# Patient Record
Sex: Male | Born: 1950 | Race: White | Hispanic: No | Marital: Married | Smoking: Former smoker
Health system: Southern US, Community
[De-identification: ages and names within clinical notes are randomized; demographics above are authoritative.]

## PROBLEM LIST (undated history)

## (undated) DIAGNOSIS — I219 Acute myocardial infarction, unspecified: Secondary | ICD-10-CM

## (undated) DIAGNOSIS — I639 Cerebral infarction, unspecified: Secondary | ICD-10-CM

## (undated) DIAGNOSIS — C61 Malignant neoplasm of prostate: Secondary | ICD-10-CM

## (undated) DIAGNOSIS — E119 Type 2 diabetes mellitus without complications: Secondary | ICD-10-CM

## (undated) DIAGNOSIS — M1990 Unspecified osteoarthritis, unspecified site: Secondary | ICD-10-CM

## (undated) DIAGNOSIS — I10 Essential (primary) hypertension: Secondary | ICD-10-CM

## (undated) DIAGNOSIS — I2510 Atherosclerotic heart disease of native coronary artery without angina pectoris: Secondary | ICD-10-CM

## (undated) DIAGNOSIS — N400 Enlarged prostate without lower urinary tract symptoms: Secondary | ICD-10-CM

## (undated) DIAGNOSIS — M109 Gout, unspecified: Secondary | ICD-10-CM

## (undated) HISTORY — DX: Acute myocardial infarction, unspecified: I21.9

## (undated) HISTORY — DX: Cerebral infarction, unspecified: I63.9

## (undated) HISTORY — DX: Malignant neoplasm of prostate: C61

## (undated) HISTORY — DX: Atherosclerotic heart disease of native coronary artery without angina pectoris: I25.10

## (undated) HISTORY — DX: Type 2 diabetes mellitus without complications: E11.9

## (undated) HISTORY — DX: Gout, unspecified: M10.9

## (undated) HISTORY — DX: Unspecified osteoarthritis, unspecified site: M19.90

## (undated) HISTORY — PX: CHOLECYSTECTOMY: SHX55

## (undated) HISTORY — DX: Benign prostatic hyperplasia without lower urinary tract symptoms: N40.0

## (undated) HISTORY — DX: Essential (primary) hypertension: I10

---

## 1994-12-11 DIAGNOSIS — I219 Acute myocardial infarction, unspecified: Secondary | ICD-10-CM

## 1994-12-11 HISTORY — PX: RIGHT HEART CATHETERIZATION WITH ADENOSINE STUDY: SHX6076

## 1994-12-11 HISTORY — DX: Acute myocardial infarction, unspecified: I21.9

## 2006-11-10 ENCOUNTER — Ambulatory Visit: Payer: Self-pay | Admitting: Internal Medicine

## 2007-07-10 ENCOUNTER — Other Ambulatory Visit: Payer: Self-pay

## 2007-07-10 ENCOUNTER — Emergency Department: Payer: Self-pay | Admitting: Emergency Medicine

## 2007-07-13 ENCOUNTER — Ambulatory Visit: Payer: Self-pay | Admitting: Internal Medicine

## 2009-04-24 ENCOUNTER — Emergency Department: Payer: Self-pay | Admitting: Emergency Medicine

## 2009-07-28 ENCOUNTER — Ambulatory Visit: Payer: Self-pay | Admitting: Urology

## 2009-08-10 ENCOUNTER — Ambulatory Visit: Payer: Self-pay

## 2009-08-11 ENCOUNTER — Ambulatory Visit: Payer: Self-pay | Admitting: Urology

## 2009-08-12 ENCOUNTER — Ambulatory Visit: Payer: Self-pay | Admitting: Urology

## 2010-09-21 ENCOUNTER — Ambulatory Visit: Payer: Self-pay | Admitting: Family Medicine

## 2010-09-28 ENCOUNTER — Ambulatory Visit: Payer: Self-pay | Admitting: Urology

## 2010-10-04 ENCOUNTER — Ambulatory Visit: Payer: Self-pay | Admitting: Urology

## 2010-10-05 ENCOUNTER — Ambulatory Visit: Payer: Self-pay | Admitting: Urology

## 2010-10-06 ENCOUNTER — Ambulatory Visit: Payer: Self-pay | Admitting: Urology

## 2012-02-13 DIAGNOSIS — E7800 Pure hypercholesterolemia, unspecified: Secondary | ICD-10-CM | POA: Insufficient documentation

## 2013-04-04 DIAGNOSIS — M549 Dorsalgia, unspecified: Secondary | ICD-10-CM | POA: Insufficient documentation

## 2013-04-04 DIAGNOSIS — M129 Arthropathy, unspecified: Secondary | ICD-10-CM | POA: Insufficient documentation

## 2014-01-05 DIAGNOSIS — D691 Qualitative platelet defects: Secondary | ICD-10-CM | POA: Insufficient documentation

## 2014-03-14 ENCOUNTER — Emergency Department: Payer: Self-pay | Admitting: Emergency Medicine

## 2014-03-14 LAB — PROTIME-INR
INR: 1
Prothrombin Time: 12.8 secs (ref 11.5–14.7)

## 2014-03-14 LAB — CBC
HCT: 40.3 % (ref 40.0–52.0)
HGB: 13.5 g/dL (ref 13.0–18.0)
MCH: 27 pg (ref 26.0–34.0)
MCHC: 33.5 g/dL (ref 32.0–36.0)
MCV: 80 fL (ref 80–100)
Platelet: 108 10*3/uL — ABNORMAL LOW (ref 150–440)
RBC: 5.02 10*6/uL (ref 4.40–5.90)
RDW: 14.6 % — ABNORMAL HIGH (ref 11.5–14.5)
WBC: 12.4 10*3/uL — ABNORMAL HIGH (ref 3.8–10.6)

## 2014-03-14 LAB — COMPREHENSIVE METABOLIC PANEL
ALBUMIN: 3.5 g/dL (ref 3.4–5.0)
AST: 18 U/L (ref 15–37)
Alkaline Phosphatase: 101 U/L
Anion Gap: 8 (ref 7–16)
BUN: 15 mg/dL (ref 7–18)
Bilirubin,Total: 0.4 mg/dL (ref 0.2–1.0)
CALCIUM: 8.6 mg/dL (ref 8.5–10.1)
CREATININE: 1.15 mg/dL (ref 0.60–1.30)
Chloride: 103 mmol/L (ref 98–107)
Co2: 27 mmol/L (ref 21–32)
EGFR (Non-African Amer.): >60
Glucose: 195 mg/dL — ABNORMAL HIGH (ref 65–99)
OSMOLALITY: 282 (ref 275–301)
Potassium: 3.9 mmol/L (ref 3.5–5.1)
SGPT (ALT): 27 U/L (ref 12–78)
Sodium: 138 mmol/L (ref 136–145)
Total Protein: 6.7 g/dL (ref 6.4–8.2)

## 2014-03-14 LAB — CK TOTAL AND CKMB (NOT AT ARMC)
CK, Total: 133 U/L
CK-MB: 1.4 ng/mL (ref 0.5–3.6)

## 2014-03-14 LAB — TROPONIN I

## 2014-04-01 ENCOUNTER — Ambulatory Visit: Payer: Self-pay | Admitting: Otolaryngology

## 2014-05-06 DIAGNOSIS — H9319 Tinnitus, unspecified ear: Secondary | ICD-10-CM | POA: Insufficient documentation

## 2014-05-06 DIAGNOSIS — R51 Headache: Secondary | ICD-10-CM

## 2014-05-06 DIAGNOSIS — R519 Headache, unspecified: Secondary | ICD-10-CM | POA: Insufficient documentation

## 2014-06-23 DIAGNOSIS — I633 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 of unspecified cerebral artery: Secondary | ICD-10-CM | POA: Insufficient documentation

## 2014-09-10 DIAGNOSIS — H9313 Tinnitus, bilateral: Secondary | ICD-10-CM | POA: Insufficient documentation

## 2014-09-10 DIAGNOSIS — R972 Elevated prostate specific antigen [PSA]: Secondary | ICD-10-CM | POA: Insufficient documentation

## 2014-09-10 DIAGNOSIS — G44219 Episodic tension-type headache, not intractable: Secondary | ICD-10-CM | POA: Insufficient documentation

## 2014-09-10 DIAGNOSIS — E669 Obesity, unspecified: Secondary | ICD-10-CM | POA: Insufficient documentation

## 2014-09-30 DIAGNOSIS — I10 Essential (primary) hypertension: Secondary | ICD-10-CM | POA: Insufficient documentation

## 2015-03-01 ENCOUNTER — Ambulatory Visit: Payer: Self-pay | Admitting: Urology

## 2015-04-01 ENCOUNTER — Ambulatory Visit
Admit: 2015-04-01 | Disposition: A | Discharge status: Home / Self Care | Payer: Self-pay | Attending: Urology | Admitting: Urology

## 2015-04-01 DIAGNOSIS — C61 Malignant neoplasm of prostate: Secondary | ICD-10-CM | POA: Insufficient documentation

## 2015-04-08 DIAGNOSIS — Z87442 Personal history of urinary calculi: Secondary | ICD-10-CM | POA: Insufficient documentation

## 2015-04-08 DIAGNOSIS — M109 Gout, unspecified: Secondary | ICD-10-CM | POA: Insufficient documentation

## 2015-04-21 ENCOUNTER — Ambulatory Visit
Admission: RE | Admit: 2015-04-21 | Discharge: 2015-04-21 | Disposition: A | Discharge status: Home / Self Care | Payer: No Typology Code available for payment source | Source: Ambulatory Visit | Attending: Radiation Oncology | Admitting: Radiation Oncology

## 2015-04-21 ENCOUNTER — Encounter: Payer: Self-pay | Admitting: Radiation Oncology

## 2015-04-21 VITALS — BP 147/81 | HR 79 | Temp 97.1°F | Resp 20 | Ht 74.0 in | Wt 276.0 lb

## 2015-04-21 DIAGNOSIS — Z510 Encounter for antineoplastic radiation therapy: Secondary | ICD-10-CM | POA: Insufficient documentation

## 2015-04-21 DIAGNOSIS — C61 Malignant neoplasm of prostate: Secondary | ICD-10-CM | POA: Insufficient documentation

## 2015-04-21 NOTE — Consult Note (Signed)
Radiation Oncology NEW PATIENT EVALUATION  Name: Warren Decoteau Stitt Sr.  MRN: [id number]  Date:   04/21/2015     DOB: 1951-05-31   This 64 y.o. male patient presents to the clinic for initial evaluation of prostate cancer stage IIa.  REFERRING PHYSICIAN: No ref. provider found  CHIEF COMPLAINT:  Chief Complaint  Patient presents with  . Prostate Cancer    Initial consultation for radiation therapy for prostate cancer    DIAGNOSIS: The encounter diagnosis was Prostate cancer. staged IIa (T1 cN0 M0) Gleason score 7 (3+4) presenting with a PSA of 6.   PREVIOUS INVESTIGATIONS:  Bone scan not ordered as appropriate with given level of PSA Pathology report reviewed Clinical notes reviewed  HPI: Patient is a pleasant 64 year old male presented with recurrent gross hematuria was found to have multiple bladder calculi and mild left hydronephrosis and had recently passed a stone back in April 2016. That time it was noted he had a PSA of 6. Patient underwent 12 core biopsy showing 2 cores positive for adenocarcinoma the highest Gleason score of 7 (3+4). Patient has very low minimal lower urinary tract symptoms no frequency urgency or nocturia. Treatment options have been discussed with the patient he is seen today for radiation oncology consultation. Patient Nicki Reaper a large over 100 cc prostate making seed implantation prohibited. Patient also has multiple comorbidities including previous CVA, adult-onset diabetes, hypertension. Patient has strongly declined any surgical intervention.  PLANNED TREATMENT REGIMEN: Image guided IMRT radiation therapy  PAST MEDICAL HISTORY:  has a past medical history of Prostate cancer; Hypertension; Arthritis; Gout; Stroke; Diabetes mellitus without complication; and BPH (benign prostatic hyperplasia).    PAST SURGICAL HISTORY: History reviewed. No pertinent past surgical history.  FAMILY HISTORY: family history is not on file.  SOCIAL HISTORY:  reports that he  has quit smoking. He has never used smokeless tobacco.  ALLERGIES: Statins  MEDICATIONS:  Current Outpatient Prescriptions  Medication Sig Dispense Refill  . amLODipine (NORVASC) 5 MG tablet   0  . Ascorbic Acid (VITAMIN C) 1000 MG tablet Take 1,000 mg by mouth daily.    [name] aspirin 325 MG tablet Take by mouth.    [name] atorvastatin (LIPITOR) 40 MG tablet   0  . Cholecalciferol (VITAMIN D3) 1000 UNITS CAPS Take 1 capsule by mouth daily.    . cloNIDine (CATAPRES) 0.1 MG tablet   0  . colchicine 0.6 MG tablet   0  . Erythromycin 2 % PADS Apply topically.    . finasteride (PROSCAR) 5 MG tablet Take 5 mg by mouth daily.  1  . glipiZIDE (GLUCOTROL XL) 5 MG 24 hr tablet   0  . hydrochlorothiazide (HYDRODIURIL) 25 MG tablet   0  . meclizine (ANTIVERT) 12.5 MG tablet Take 12.5 mg by mouth 2 (two) times daily.  0  . metFORMIN (GLUCOPHAGE-XR) 500 MG 24 hr tablet   1  . metoprolol (TOPROL-XL) 200 MG 24 hr tablet   0  . potassium chloride SA (K-DUR,KLOR-CON) 20 MEQ tablet   0  . ramipril (ALTACE) 10 MG capsule   0  . rOPINIRole (REQUIP) 0.25 MG tablet   0  . tamsulosin (FLOMAX) 0.4 MG CAPS capsule Take 0.4 mg by mouth daily.  1  . Turmeric 450 MG CAPS Take 1 capsule by mouth daily.    . cyclobenzaprine (FLEXERIL) 10 MG tablet Take by mouth.    [name] omeprazole (PRILOSEC) 40 MG capsule Take by mouth.    . vitamin B-12 (CYANOCOBALAMIN) 100 MCG  tablet Take 100 mcg by mouth daily.     No current facility-administered medications for this encounter.    ECOG PERFORMANCE STATUS:  0 - Asymptomatic  REVIEW OF SYSTEMS:  Patient denies any weight loss, fatigue, weakness, fever, chills or night sweats. Patient denies any loss of vision, blurred vision. Patient denies any ringing  of the ears or hearing loss. No irregular heartbeat. Patient denies heart murmur or history of fainting. Patient denies any chest pain or pain radiating to her upper extremities. Patient denies any shortness of breath, difficulty  breathing at night, cough or hemoptysis. Patient denies any swelling in the lower legs. Patient denies any nausea vomiting, vomiting of blood, or coffee ground material in the vomitus. Patient denies any stomach pain. Patient states has had normal bowel movements no significant constipation or diarrhea. Patient denies any dysuria, hematuria or significant nocturia. Patient denies any problems walking, swelling in the joints or loss of balance. Patient denies any skin changes, loss of hair or loss of weight. Patient denies any excessive worrying or anxiety or significant depression. Patient denies any problems with insomnia. Patient denies excessive thirst, polyuria, polydipsia. Patient denies any swollen glands, patient denies easy bruising or easy bleeding. Patient denies any recent infections, allergies or URI. Patient "s visual fields have not changed significantly in recent time.    PHYSICAL EXAM: BP 147/81 mmHg  Pulse 79  Temp(Src) 97.1 F (36.2 C)  Resp 20  Ht 6\' 2"  (1.88 m)  Wt 276 lb 0.3 oz (125.2 kg)  BMI 35.42 kg/m2 Well-developed slightly obese male in NAD. Lungs are clear to A&P cardiac examination shows regular rate and rhythm. On rectal exam rectal sphincter tone is good prostate is enlarged no discrete nodularity or masses identified sulcus preserved bilaterally. No other rectal abnormality is identified. Well-developed well-nourished patient in NAD. HEENT reveals PERLA, EOMI, discs not visualized.  Oral cavity is clear. No oral mucosal lesions are identified. Neck is clear without evidence of cervical or supraclavicular adenopathy. Lungs are clear to A&P. Cardiac examination is essentially unremarkable with regular rate and rhythm without murmur rub or thrill. Abdomen is benign with no organomegaly or masses noted. Motor sensory and DTR levels are equal and symmetric in the upper and lower extremities. Cranial nerves II through XII are grossly intact. Proprioception is intact. No  peripheral adenopathy or edema is identified. No motor or sensory levels are noted. Crude visual fields are within normal range.   LABORATORY DATA:  No results found for this or any previous visit (from the past 72 hour(s)).   RADIOLOGY RESULTS: No results found.  IMPRESSION: Stage IIa Gleason 7 (3+4) adenocarcinoma the prostate with multiple medical comorbidities in 64 year old male  PLAN: At this time I have again gone over treatment options with the patient. He is adamant about refusing surgery. Prostate is too large for seed implantation. I have recommended image guided IM RT radiation therapy up to 8000 cGy over 8 weeks. I have asked Dr. Erlene Quan to place gold fiduciary markers in the prostate for daily image guided treatment. Risks and benefits of treatment treatment including increased lower urinary tract symptoms, possible diarrhea, fatigue, alteration of blood counts, and possible skin reaction all were discussed in detail with the patient. He seems to comprehend my treatment plan well. I have set him up for CT simulation shortly after markers are placed.  I would like to take this opportunity for allowing me to participate in the care of your patient.Armstead Peaks., MD

## 2015-05-04 ENCOUNTER — Ambulatory Visit
Admission: RE | Admit: 2015-05-04 | Discharge: 2015-05-04 | Disposition: A | Discharge status: Home / Self Care | Payer: No Typology Code available for payment source | Source: Ambulatory Visit | Attending: Radiation Oncology | Admitting: Radiation Oncology

## 2015-05-04 DIAGNOSIS — Z510 Encounter for antineoplastic radiation therapy: Secondary | ICD-10-CM | POA: Diagnosis not present

## 2015-05-04 DIAGNOSIS — C61 Malignant neoplasm of prostate: Secondary | ICD-10-CM | POA: Diagnosis present

## 2015-05-14 ENCOUNTER — Other Ambulatory Visit: Payer: Self-pay | Admitting: *Deleted

## 2015-05-14 DIAGNOSIS — C61 Malignant neoplasm of prostate: Secondary | ICD-10-CM | POA: Diagnosis not present

## 2015-05-17 ENCOUNTER — Ambulatory Visit
Admission: RE | Admit: 2015-05-17 | Discharge: 2015-05-17 | Disposition: A | Discharge status: Home / Self Care | Payer: No Typology Code available for payment source | Source: Ambulatory Visit | Attending: Radiation Oncology | Admitting: Radiation Oncology

## 2015-05-17 DIAGNOSIS — C61 Malignant neoplasm of prostate: Secondary | ICD-10-CM | POA: Diagnosis not present

## 2015-05-18 ENCOUNTER — Ambulatory Visit
Admission: RE | Admit: 2015-05-18 | Discharge: 2015-05-18 | Disposition: A | Discharge status: Home / Self Care | Payer: No Typology Code available for payment source | Source: Ambulatory Visit | Attending: Radiation Oncology | Admitting: Radiation Oncology

## 2015-05-18 DIAGNOSIS — C61 Malignant neoplasm of prostate: Secondary | ICD-10-CM | POA: Diagnosis not present

## 2015-05-19 ENCOUNTER — Ambulatory Visit
Admission: RE | Admit: 2015-05-19 | Discharge: 2015-05-19 | Disposition: A | Discharge status: Home / Self Care | Payer: No Typology Code available for payment source | Source: Ambulatory Visit | Attending: Radiation Oncology | Admitting: Radiation Oncology

## 2015-05-19 DIAGNOSIS — C61 Malignant neoplasm of prostate: Secondary | ICD-10-CM | POA: Diagnosis not present

## 2015-05-20 ENCOUNTER — Ambulatory Visit
Admission: RE | Admit: 2015-05-20 | Discharge: 2015-05-20 | Disposition: A | Discharge status: Home / Self Care | Payer: No Typology Code available for payment source | Source: Ambulatory Visit | Attending: Radiation Oncology | Admitting: Radiation Oncology

## 2015-05-20 DIAGNOSIS — C61 Malignant neoplasm of prostate: Secondary | ICD-10-CM | POA: Diagnosis not present

## 2015-05-21 ENCOUNTER — Ambulatory Visit
Admission: RE | Admit: 2015-05-21 | Discharge: 2015-05-21 | Disposition: A | Discharge status: Home / Self Care | Payer: No Typology Code available for payment source | Source: Ambulatory Visit | Attending: Radiation Oncology | Admitting: Radiation Oncology

## 2015-05-21 DIAGNOSIS — C61 Malignant neoplasm of prostate: Secondary | ICD-10-CM | POA: Diagnosis not present

## 2015-05-24 ENCOUNTER — Ambulatory Visit
Admission: RE | Admit: 2015-05-24 | Discharge: 2015-05-24 | Disposition: A | Discharge status: Home / Self Care | Payer: No Typology Code available for payment source | Source: Ambulatory Visit | Attending: Radiation Oncology | Admitting: Radiation Oncology

## 2015-05-24 DIAGNOSIS — C61 Malignant neoplasm of prostate: Secondary | ICD-10-CM | POA: Diagnosis not present

## 2015-05-25 ENCOUNTER — Ambulatory Visit
Admission: RE | Admit: 2015-05-25 | Discharge: 2015-05-25 | Disposition: A | Discharge status: Home / Self Care | Payer: No Typology Code available for payment source | Source: Ambulatory Visit | Attending: Radiation Oncology | Admitting: Radiation Oncology

## 2015-05-25 DIAGNOSIS — C61 Malignant neoplasm of prostate: Secondary | ICD-10-CM | POA: Diagnosis not present

## 2015-05-26 ENCOUNTER — Ambulatory Visit
Admission: RE | Admit: 2015-05-26 | Discharge: 2015-05-26 | Disposition: A | Discharge status: Home / Self Care | Payer: No Typology Code available for payment source | Source: Ambulatory Visit | Attending: Radiation Oncology | Admitting: Radiation Oncology

## 2015-05-26 DIAGNOSIS — C61 Malignant neoplasm of prostate: Secondary | ICD-10-CM | POA: Diagnosis not present

## 2015-05-27 ENCOUNTER — Ambulatory Visit
Admission: RE | Admit: 2015-05-27 | Discharge: 2015-05-27 | Disposition: A | Discharge status: Home / Self Care | Payer: No Typology Code available for payment source | Source: Ambulatory Visit | Attending: Radiation Oncology | Admitting: Radiation Oncology

## 2015-05-27 ENCOUNTER — Inpatient Hospital Stay: Payer: No Typology Code available for payment source | Attending: Radiation Oncology

## 2015-05-27 DIAGNOSIS — Z510 Encounter for antineoplastic radiation therapy: Secondary | ICD-10-CM | POA: Diagnosis not present

## 2015-05-27 DIAGNOSIS — C61 Malignant neoplasm of prostate: Secondary | ICD-10-CM | POA: Insufficient documentation

## 2015-05-27 LAB — CBC
HEMATOCRIT: 42.5 % (ref 40.0–52.0)
HEMOGLOBIN: 14.2 g/dL (ref 13.0–18.0)
MCH: 25.8 pg — AB (ref 26.0–34.0)
MCHC: 33.4 g/dL (ref 32.0–36.0)
MCV: 77.1 fL — AB (ref 80.0–100.0)
Platelets: 126 10*3/uL — ABNORMAL LOW (ref 150–440)
RBC: 5.51 MIL/uL (ref 4.40–5.90)
RDW: 15.9 % — ABNORMAL HIGH (ref 11.5–14.5)
WBC: 9 10*3/uL (ref 3.8–10.6)

## 2015-05-28 ENCOUNTER — Ambulatory Visit
Admission: RE | Admit: 2015-05-28 | Discharge: 2015-05-28 | Disposition: A | Discharge status: Home / Self Care | Payer: No Typology Code available for payment source | Source: Ambulatory Visit | Attending: Radiation Oncology | Admitting: Radiation Oncology

## 2015-05-28 DIAGNOSIS — C61 Malignant neoplasm of prostate: Secondary | ICD-10-CM | POA: Diagnosis not present

## 2015-05-31 ENCOUNTER — Ambulatory Visit: Admission: RE | Admit: 2015-05-31 | Payer: No Typology Code available for payment source | Source: Ambulatory Visit

## 2015-06-01 ENCOUNTER — Ambulatory Visit
Admission: RE | Admit: 2015-06-01 | Discharge: 2015-06-01 | Disposition: A | Discharge status: Home / Self Care | Payer: No Typology Code available for payment source | Source: Ambulatory Visit | Attending: Radiation Oncology | Admitting: Radiation Oncology

## 2015-06-01 DIAGNOSIS — C61 Malignant neoplasm of prostate: Secondary | ICD-10-CM | POA: Diagnosis not present

## 2015-06-02 ENCOUNTER — Ambulatory Visit
Admission: RE | Admit: 2015-06-02 | Discharge: 2015-06-02 | Disposition: A | Discharge status: Home / Self Care | Payer: No Typology Code available for payment source | Source: Ambulatory Visit | Attending: Radiation Oncology | Admitting: Radiation Oncology

## 2015-06-02 DIAGNOSIS — C61 Malignant neoplasm of prostate: Secondary | ICD-10-CM | POA: Diagnosis not present

## 2015-06-03 ENCOUNTER — Ambulatory Visit
Admission: RE | Admit: 2015-06-03 | Discharge: 2015-06-03 | Disposition: A | Discharge status: Home / Self Care | Payer: No Typology Code available for payment source | Source: Ambulatory Visit | Attending: Radiation Oncology | Admitting: Radiation Oncology

## 2015-06-03 DIAGNOSIS — C61 Malignant neoplasm of prostate: Secondary | ICD-10-CM | POA: Diagnosis not present

## 2015-06-04 ENCOUNTER — Ambulatory Visit
Admission: RE | Admit: 2015-06-04 | Discharge: 2015-06-04 | Disposition: A | Discharge status: Home / Self Care | Payer: No Typology Code available for payment source | Source: Ambulatory Visit | Attending: Radiation Oncology | Admitting: Radiation Oncology

## 2015-06-04 DIAGNOSIS — C61 Malignant neoplasm of prostate: Secondary | ICD-10-CM | POA: Diagnosis not present

## 2015-06-07 ENCOUNTER — Ambulatory Visit
Admission: RE | Admit: 2015-06-07 | Discharge: 2015-06-07 | Disposition: A | Discharge status: Home / Self Care | Payer: No Typology Code available for payment source | Source: Ambulatory Visit | Attending: Radiation Oncology | Admitting: Radiation Oncology

## 2015-06-07 DIAGNOSIS — C61 Malignant neoplasm of prostate: Secondary | ICD-10-CM | POA: Diagnosis not present

## 2015-06-08 ENCOUNTER — Ambulatory Visit
Admission: RE | Admit: 2015-06-08 | Discharge: 2015-06-08 | Disposition: A | Discharge status: Home / Self Care | Payer: No Typology Code available for payment source | Source: Ambulatory Visit | Attending: Radiation Oncology | Admitting: Radiation Oncology

## 2015-06-08 ENCOUNTER — Ambulatory Visit: Payer: No Typology Code available for payment source | Admitting: Radiation Oncology

## 2015-06-08 DIAGNOSIS — C61 Malignant neoplasm of prostate: Secondary | ICD-10-CM | POA: Diagnosis not present

## 2015-06-09 ENCOUNTER — Ambulatory Visit
Admission: RE | Admit: 2015-06-09 | Discharge: 2015-06-09 | Disposition: A | Discharge status: Home / Self Care | Payer: No Typology Code available for payment source | Source: Ambulatory Visit | Attending: Radiation Oncology | Admitting: Radiation Oncology

## 2015-06-09 DIAGNOSIS — C61 Malignant neoplasm of prostate: Secondary | ICD-10-CM | POA: Diagnosis not present

## 2015-06-10 ENCOUNTER — Ambulatory Visit
Admission: RE | Admit: 2015-06-10 | Discharge: 2015-06-10 | Disposition: A | Discharge status: Home / Self Care | Payer: No Typology Code available for payment source | Source: Ambulatory Visit | Attending: Radiation Oncology | Admitting: Radiation Oncology

## 2015-06-10 ENCOUNTER — Inpatient Hospital Stay: Payer: No Typology Code available for payment source

## 2015-06-10 DIAGNOSIS — C61 Malignant neoplasm of prostate: Secondary | ICD-10-CM | POA: Diagnosis not present

## 2015-06-10 LAB — CBC
HEMATOCRIT: 42.8 % (ref 40.0–52.0)
Hemoglobin: 14.1 g/dL (ref 13.0–18.0)
MCH: 25.8 pg — ABNORMAL LOW (ref 26.0–34.0)
MCHC: 33 g/dL (ref 32.0–36.0)
MCV: 78.2 fL — ABNORMAL LOW (ref 80.0–100.0)
Platelets: 118 10*3/uL — ABNORMAL LOW (ref 150–440)
RBC: 5.47 MIL/uL (ref 4.40–5.90)
RDW: 16 % — AB (ref 11.5–14.5)
WBC: 8.9 10*3/uL (ref 3.8–10.6)

## 2015-06-11 ENCOUNTER — Ambulatory Visit
Admission: RE | Admit: 2015-06-11 | Discharge: 2015-06-11 | Disposition: A | Discharge status: Home / Self Care | Payer: No Typology Code available for payment source | Source: Ambulatory Visit | Attending: Radiation Oncology | Admitting: Radiation Oncology

## 2015-06-11 DIAGNOSIS — C61 Malignant neoplasm of prostate: Secondary | ICD-10-CM | POA: Diagnosis not present

## 2015-06-15 ENCOUNTER — Ambulatory Visit
Admission: RE | Admit: 2015-06-15 | Discharge: 2015-06-15 | Disposition: A | Discharge status: Home / Self Care | Payer: No Typology Code available for payment source | Source: Ambulatory Visit | Attending: Radiation Oncology | Admitting: Radiation Oncology

## 2015-06-15 DIAGNOSIS — C61 Malignant neoplasm of prostate: Secondary | ICD-10-CM | POA: Diagnosis not present

## 2015-06-16 ENCOUNTER — Ambulatory Visit
Admission: RE | Admit: 2015-06-16 | Discharge: 2015-06-16 | Disposition: A | Discharge status: Home / Self Care | Payer: No Typology Code available for payment source | Source: Ambulatory Visit | Attending: Radiation Oncology | Admitting: Radiation Oncology

## 2015-06-16 DIAGNOSIS — C61 Malignant neoplasm of prostate: Secondary | ICD-10-CM | POA: Diagnosis not present

## 2015-06-17 ENCOUNTER — Ambulatory Visit
Admission: RE | Admit: 2015-06-17 | Discharge: 2015-06-17 | Disposition: A | Discharge status: Home / Self Care | Payer: No Typology Code available for payment source | Source: Ambulatory Visit | Attending: Radiation Oncology | Admitting: Radiation Oncology

## 2015-06-17 DIAGNOSIS — C61 Malignant neoplasm of prostate: Secondary | ICD-10-CM | POA: Diagnosis not present

## 2015-06-18 ENCOUNTER — Ambulatory Visit
Admission: RE | Admit: 2015-06-18 | Discharge: 2015-06-18 | Disposition: A | Discharge status: Home / Self Care | Payer: No Typology Code available for payment source | Source: Ambulatory Visit | Attending: Radiation Oncology | Admitting: Radiation Oncology

## 2015-06-18 DIAGNOSIS — C61 Malignant neoplasm of prostate: Secondary | ICD-10-CM | POA: Diagnosis not present

## 2015-06-21 ENCOUNTER — Ambulatory Visit
Admission: RE | Admit: 2015-06-21 | Discharge: 2015-06-21 | Disposition: A | Discharge status: Home / Self Care | Payer: No Typology Code available for payment source | Source: Ambulatory Visit | Attending: Radiation Oncology | Admitting: Radiation Oncology

## 2015-06-21 DIAGNOSIS — C61 Malignant neoplasm of prostate: Secondary | ICD-10-CM | POA: Diagnosis not present

## 2015-06-22 ENCOUNTER — Ambulatory Visit
Admission: RE | Admit: 2015-06-22 | Discharge: 2015-06-22 | Disposition: A | Discharge status: Home / Self Care | Payer: No Typology Code available for payment source | Source: Ambulatory Visit | Attending: Radiation Oncology | Admitting: Radiation Oncology

## 2015-06-22 DIAGNOSIS — C61 Malignant neoplasm of prostate: Secondary | ICD-10-CM | POA: Diagnosis not present

## 2015-06-23 ENCOUNTER — Ambulatory Visit
Admission: RE | Admit: 2015-06-23 | Discharge: 2015-06-23 | Disposition: A | Discharge status: Home / Self Care | Payer: No Typology Code available for payment source | Source: Ambulatory Visit | Attending: Radiation Oncology | Admitting: Radiation Oncology

## 2015-06-23 DIAGNOSIS — C61 Malignant neoplasm of prostate: Secondary | ICD-10-CM | POA: Diagnosis not present

## 2015-06-24 ENCOUNTER — Inpatient Hospital Stay: Payer: No Typology Code available for payment source | Attending: Radiation Oncology

## 2015-06-24 ENCOUNTER — Ambulatory Visit
Admission: RE | Admit: 2015-06-24 | Discharge: 2015-06-24 | Disposition: A | Discharge status: Home / Self Care | Payer: No Typology Code available for payment source | Source: Ambulatory Visit | Attending: Radiation Oncology | Admitting: Radiation Oncology

## 2015-06-24 DIAGNOSIS — C61 Malignant neoplasm of prostate: Secondary | ICD-10-CM | POA: Insufficient documentation

## 2015-06-24 LAB — CBC
HEMATOCRIT: 41.6 % (ref 40.0–52.0)
Hemoglobin: 14 g/dL (ref 13.0–18.0)
MCH: 26.2 pg (ref 26.0–34.0)
MCHC: 33.6 g/dL (ref 32.0–36.0)
MCV: 78.2 fL — ABNORMAL LOW (ref 80.0–100.0)
Platelets: 129 10*3/uL — ABNORMAL LOW (ref 150–440)
RBC: 5.32 MIL/uL (ref 4.40–5.90)
RDW: 16.2 % — AB (ref 11.5–14.5)
WBC: 8.6 10*3/uL (ref 3.8–10.6)

## 2015-06-25 ENCOUNTER — Ambulatory Visit
Admission: RE | Admit: 2015-06-25 | Discharge: 2015-06-25 | Disposition: A | Discharge status: Home / Self Care | Payer: No Typology Code available for payment source | Source: Ambulatory Visit | Attending: Radiation Oncology | Admitting: Radiation Oncology

## 2015-06-25 DIAGNOSIS — C61 Malignant neoplasm of prostate: Secondary | ICD-10-CM | POA: Diagnosis not present

## 2015-06-28 ENCOUNTER — Ambulatory Visit
Admission: RE | Admit: 2015-06-28 | Discharge: 2015-06-28 | Disposition: A | Discharge status: Home / Self Care | Payer: No Typology Code available for payment source | Source: Ambulatory Visit | Attending: Radiation Oncology | Admitting: Radiation Oncology

## 2015-06-28 DIAGNOSIS — C61 Malignant neoplasm of prostate: Secondary | ICD-10-CM | POA: Diagnosis not present

## 2015-06-29 ENCOUNTER — Ambulatory Visit: Payer: No Typology Code available for payment source | Admitting: Radiation Oncology

## 2015-06-29 ENCOUNTER — Ambulatory Visit
Admission: RE | Admit: 2015-06-29 | Discharge: 2015-06-29 | Disposition: A | Discharge status: Home / Self Care | Payer: No Typology Code available for payment source | Source: Ambulatory Visit | Attending: Radiation Oncology | Admitting: Radiation Oncology

## 2015-06-29 DIAGNOSIS — C61 Malignant neoplasm of prostate: Secondary | ICD-10-CM | POA: Diagnosis not present

## 2015-06-30 ENCOUNTER — Ambulatory Visit: Admission: RE | Admit: 2015-06-30 | Payer: No Typology Code available for payment source | Source: Ambulatory Visit

## 2015-07-01 ENCOUNTER — Ambulatory Visit
Admission: RE | Admit: 2015-07-01 | Discharge: 2015-07-01 | Disposition: A | Discharge status: Home / Self Care | Payer: No Typology Code available for payment source | Source: Ambulatory Visit | Attending: Radiation Oncology | Admitting: Radiation Oncology

## 2015-07-01 DIAGNOSIS — C61 Malignant neoplasm of prostate: Secondary | ICD-10-CM | POA: Diagnosis not present

## 2015-07-02 ENCOUNTER — Ambulatory Visit
Admission: RE | Admit: 2015-07-02 | Discharge: 2015-07-02 | Disposition: A | Discharge status: Home / Self Care | Payer: No Typology Code available for payment source | Source: Ambulatory Visit | Attending: Radiation Oncology | Admitting: Radiation Oncology

## 2015-07-02 DIAGNOSIS — C61 Malignant neoplasm of prostate: Secondary | ICD-10-CM | POA: Diagnosis not present

## 2015-07-05 ENCOUNTER — Ambulatory Visit
Admission: RE | Admit: 2015-07-05 | Discharge: 2015-07-05 | Disposition: A | Discharge status: Home / Self Care | Payer: No Typology Code available for payment source | Source: Ambulatory Visit | Attending: Radiation Oncology | Admitting: Radiation Oncology

## 2015-07-05 DIAGNOSIS — C61 Malignant neoplasm of prostate: Secondary | ICD-10-CM | POA: Diagnosis not present

## 2015-07-06 ENCOUNTER — Ambulatory Visit
Admission: RE | Admit: 2015-07-06 | Discharge: 2015-07-06 | Disposition: A | Discharge status: Home / Self Care | Payer: No Typology Code available for payment source | Source: Ambulatory Visit | Attending: Radiation Oncology | Admitting: Radiation Oncology

## 2015-07-06 DIAGNOSIS — C61 Malignant neoplasm of prostate: Secondary | ICD-10-CM | POA: Diagnosis not present

## 2015-07-07 ENCOUNTER — Ambulatory Visit
Admission: RE | Admit: 2015-07-07 | Discharge: 2015-07-07 | Disposition: A | Discharge status: Home / Self Care | Payer: No Typology Code available for payment source | Source: Ambulatory Visit | Attending: Radiation Oncology | Admitting: Radiation Oncology

## 2015-07-07 DIAGNOSIS — C61 Malignant neoplasm of prostate: Secondary | ICD-10-CM | POA: Diagnosis not present

## 2015-07-08 ENCOUNTER — Ambulatory Visit
Admission: RE | Admit: 2015-07-08 | Discharge: 2015-07-08 | Disposition: A | Discharge status: Home / Self Care | Payer: No Typology Code available for payment source | Source: Ambulatory Visit | Attending: Radiation Oncology | Admitting: Radiation Oncology

## 2015-07-08 DIAGNOSIS — C61 Malignant neoplasm of prostate: Secondary | ICD-10-CM | POA: Diagnosis not present

## 2015-07-09 ENCOUNTER — Ambulatory Visit
Admission: RE | Admit: 2015-07-09 | Discharge: 2015-07-09 | Disposition: A | Discharge status: Home / Self Care | Payer: No Typology Code available for payment source | Source: Ambulatory Visit | Attending: Radiation Oncology | Admitting: Radiation Oncology

## 2015-07-09 DIAGNOSIS — C61 Malignant neoplasm of prostate: Secondary | ICD-10-CM | POA: Diagnosis not present

## 2015-07-12 ENCOUNTER — Ambulatory Visit
Admission: RE | Admit: 2015-07-12 | Discharge: 2015-07-12 | Disposition: A | Discharge status: Home / Self Care | Payer: No Typology Code available for payment source | Source: Ambulatory Visit | Attending: Radiation Oncology | Admitting: Radiation Oncology

## 2015-07-12 DIAGNOSIS — C61 Malignant neoplasm of prostate: Secondary | ICD-10-CM | POA: Diagnosis not present

## 2015-07-13 ENCOUNTER — Ambulatory Visit
Admission: RE | Admit: 2015-07-13 | Discharge: 2015-07-13 | Disposition: A | Discharge status: Home / Self Care | Payer: No Typology Code available for payment source | Source: Ambulatory Visit | Attending: Radiation Oncology | Admitting: Radiation Oncology

## 2015-07-13 ENCOUNTER — Ambulatory Visit: Payer: No Typology Code available for payment source

## 2015-07-13 DIAGNOSIS — C61 Malignant neoplasm of prostate: Secondary | ICD-10-CM | POA: Diagnosis not present

## 2015-07-14 ENCOUNTER — Ambulatory Visit: Payer: No Typology Code available for payment source

## 2015-07-14 ENCOUNTER — Ambulatory Visit
Admission: RE | Admit: 2015-07-14 | Discharge: 2015-07-14 | Disposition: A | Discharge status: Home / Self Care | Payer: No Typology Code available for payment source | Source: Ambulatory Visit | Attending: Radiation Oncology | Admitting: Radiation Oncology

## 2015-07-14 DIAGNOSIS — C61 Malignant neoplasm of prostate: Secondary | ICD-10-CM | POA: Diagnosis not present

## 2015-07-15 ENCOUNTER — Ambulatory Visit
Admission: RE | Admit: 2015-07-15 | Discharge: 2015-07-15 | Disposition: A | Discharge status: Home / Self Care | Payer: No Typology Code available for payment source | Source: Ambulatory Visit | Attending: Radiation Oncology | Admitting: Radiation Oncology

## 2015-07-15 DIAGNOSIS — C61 Malignant neoplasm of prostate: Secondary | ICD-10-CM | POA: Diagnosis not present

## 2015-08-26 ENCOUNTER — Ambulatory Visit
Admission: RE | Admit: 2015-08-26 | Discharge: 2015-08-26 | Disposition: A | Discharge status: Home / Self Care | Payer: No Typology Code available for payment source | Source: Ambulatory Visit | Attending: Radiation Oncology | Admitting: Radiation Oncology

## 2015-08-26 ENCOUNTER — Encounter: Payer: Self-pay | Admitting: Radiation Oncology

## 2015-08-26 ENCOUNTER — Other Ambulatory Visit: Payer: Self-pay | Admitting: *Deleted

## 2015-08-26 VITALS — BP 151/84 | HR 80 | Temp 97.0°F | Resp 20 | Wt 274.4 lb

## 2015-08-26 DIAGNOSIS — C61 Malignant neoplasm of prostate: Secondary | ICD-10-CM

## 2015-08-26 NOTE — Progress Notes (Signed)
Radiation Oncology Follow up Note  Name: Warren Heppler Beckford Sr.   Date:   08/26/2015 MRN:  [id number] DOB: 07-Jul-1951    This 64 y.o. male presents to the clinic today for follow-up for prostate cancer.  REFERRING PROVIDER: [name], MD  HPI: Patient is a 64 year old male now out 1 month having completed IM RT radiation therapy for stage IIa (T1 CN 0 M0) adenocarcinoma the prostate with a Gleason score of 7 (3+4) presenting with a PSA of 6. Patient has multiple comorbidities including CVA adult-onset diabetes and hypertension. He is seen today in routine follow-up and is doing well. He states he's having no lower urinary tract symptoms or diarrhea at this time he states he still quite fatigued. He does very little exercise which I've encouraged him to increase his exercise program..  COMPLICATIONS OF TREATMENT: none  FOLLOW UP COMPLIANCE: keeps appointments   PHYSICAL EXAM:  BP 151/84 mmHg  Pulse 80  Temp(Src) 97 F (36.1 C)  Resp 20  Wt 274 lb 5.8 oz (124.45 kg) On rectal exam rectal sphincter tone is good. Prostate is smooth contracted without evidence of nodularity or mass. Sulcus is preserved bilaterally. No discrete nodularity is identified. No other rectal abnormalities are noted. Well-developed well-nourished patient in NAD. HEENT reveals PERLA, EOMI, discs not visualized.  Oral cavity is clear. No oral mucosal lesions are identified. Neck is clear without evidence of cervical or supraclavicular adenopathy. Lungs are clear to A&P. Cardiac examination is essentially unremarkable with regular rate and rhythm without murmur rub or thrill. Abdomen is benign with no organomegaly or masses noted. Motor sensory and DTR levels are equal and symmetric in the upper and lower extremities. Cranial nerves II through XII are grossly intact. Proprioception is intact. No peripheral adenopathy or edema is identified. No motor or sensory levels are noted. Crude visual fields are within normal  range.   RADIOLOGY RESULTS: No current films for review  PLAN: At the present time he is recovering nicely with very minimal side effects from his I MRT radiation therapy. I've encouraged him to increase his exercise performance Was Fatigue and Obesity. I've Explained to Him Our Protocol for Following His PSA of Asked to See Him Back in 4-5 Months for Follow-Up and We Will Obtain a PSA at That Time If It Is Not Already Been Performed. Patient Is to Call Sooner with Any Concerns.  I would like to take this opportunity for allowing me to participate in the care of your patient.Armstead Peaks., MD

## 2015-10-29 ENCOUNTER — Encounter: Payer: Self-pay | Admitting: Urology

## 2015-10-29 ENCOUNTER — Ambulatory Visit (INDEPENDENT_AMBULATORY_CARE_PROVIDER_SITE_OTHER): Payer: No Typology Code available for payment source | Admitting: Urology

## 2015-10-29 VITALS — BP 145/81 | HR 87 | Ht 74.0 in | Wt 276.8 lb

## 2015-10-29 DIAGNOSIS — C61 Malignant neoplasm of prostate: Secondary | ICD-10-CM | POA: Diagnosis not present

## 2015-10-29 DIAGNOSIS — I2510 Atherosclerotic heart disease of native coronary artery without angina pectoris: Secondary | ICD-10-CM

## 2015-10-29 DIAGNOSIS — E119 Type 2 diabetes mellitus without complications: Secondary | ICD-10-CM | POA: Insufficient documentation

## 2015-10-29 DIAGNOSIS — N200 Calculus of kidney: Secondary | ICD-10-CM | POA: Diagnosis not present

## 2015-10-29 HISTORY — DX: Atherosclerotic heart disease of native coronary artery without angina pectoris: I25.10

## 2015-10-29 NOTE — Progress Notes (Signed)
10/29/2015 2:47 PM   Warren Cera Pierro Sr. 1951-08-09 CN:[id number]  Referring provider: [name], MD [address] [place], [place] [address]  Chief Complaint  Patient presents with  . Follow-up    Prostate cancer    HPI: The patient is a 64 year old gentleman presents for follow-up of prostate cancer and nephrolithiasis.  1. Prostate cancer The patient underwent IMRT which was completed in August 2016. He is due for a PSA and generator 2017. In regards to pathology, he had Gleason 3+4 involving 6% of the tissue at the right lateral base Gleason 3+3 involving 60% of the tissue in the right lateral mid prostate for total of 2 of 12 cores. His transrectal ultrasound volume was 103 cc. His pretreatment PSA was 6.05.  He is doing well after his radiation with occasional diarrhea. He notes no other problems from the radiation therapy at this time.  2. Nephrolithiasis He underwent a hematuria workup which revealed nephrolithiasis. The CT urogram revealed multiple small bladder calculi as well as nonobstructive left kidney lower pole calculi. There is also mild left hydroureteronephrosis without filling defect that was concerning for possibly recently passed stone. Repeat ultrasound did show only mild pelviectasis. His stones were calcium oxalate.  He feels he has passed the stones last saw him. He has had no stone pain.    PMH: Past Medical History  Diagnosis Date  . Prostate cancer ([facility])   . Hypertension   . Arthritis   . Gout   . Stroke ([place])   . Diabetes mellitus without complication ([place])   . BPH (benign prostatic hyperplasia)   . Arteriosclerosis of coronary artery 10/29/2015    Overview:  with mi     Surgical History: No past surgical history on file.  Home Medications:    Medication List       This list is accurate as of: 10/29/15  2:47 PM.  Always use your most recent med list.               amLODipine 5 MG tablet  Commonly known as:  NORVASC     aspirin 325 MG tablet  Take by mouth.     atorvastatin 40 MG tablet  Commonly known as:  LIPITOR     cloNIDine 0.1 MG tablet  Commonly known as:  CATAPRES     colchicine 0.6 MG tablet     cyclobenzaprine 10 MG tablet  Commonly known as:  FLEXERIL  Take by mouth.     finasteride 5 MG tablet  Commonly known as:  PROSCAR  Take 5 mg by mouth daily.     glipiZIDE 5 MG 24 hr tablet  Commonly known as:  GLUCOTROL XL     hydrochlorothiazide 25 MG tablet  Commonly known as:  HYDRODIURIL     meclizine 25 MG tablet  Commonly known as:  ANTIVERT     metFORMIN 1000 MG (MOD) 24 hr tablet  Commonly known as:  GLUMETZA     metoprolol 200 MG 24 hr tablet  Commonly known as:  TOPROL-XL     omeprazole 40 MG capsule  Commonly known as:  PRILOSEC  Take by mouth.     potassium chloride SA 20 MEQ tablet  Commonly known as:  K-DUR,KLOR-CON     ramipril 10 MG capsule  Commonly known as:  ALTACE     rOPINIRole 0.25 MG tablet  Commonly known as:  REQUIP     tamsulosin 0.4 MG Caps capsule  Commonly known as:  FLOMAX  Take 0.4 mg by mouth daily.     Turmeric 450 MG Caps  Take 1 capsule by mouth daily.     vitamin B-12 100 MCG tablet  Commonly known as:  CYANOCOBALAMIN  Take 100 mcg by mouth daily.     vitamin C 1000 MG tablet  Take 1,000 mg by mouth daily.     Vitamin D3 1000 UNITS Caps  Take 1 capsule by mouth daily.        Allergies:  Allergies  Allergen Reactions  . Pravastatin     Other reaction(s): Unknown  . Statins Rash    Family History: No family history on file.  Social History:  reports that he has quit smoking. He has never used smokeless tobacco. His alcohol and drug histories are not on file.  ROS: UROLOGY Frequent Urination?: No Hard to postpone urination?: Yes Burning/pain with urination?: No Get up at night to urinate?: No Leakage of urine?: No Urine stream starts and stops?: No Trouble starting stream?: No Do you have to strain to  urinate?: No Blood in urine?: No Urinary tract infection?: No Sexually transmitted disease?: No Injury to kidneys or bladder?: No Painful intercourse?: No Weak stream?: No Erection problems?: Yes Penile pain?: No  Gastrointestinal Nausea?: No Vomiting?: No Indigestion/heartburn?: No Diarrhea?: No Constipation?: Yes  Constitutional Fever: No Night sweats?: Yes Weight loss?: No Fatigue?: No  Skin Skin rash/lesions?: No Itching?: No  Eyes Blurred vision?: No Double vision?: No  Ears/Nose/Throat Sore throat?: No Sinus problems?: No  Hematologic/Lymphatic Swollen glands?: No Easy bruising?: No  Cardiovascular Leg swelling?: Yes Chest pain?: No  Respiratory Cough?: No Shortness of breath?: No  Endocrine Excessive thirst?: No  Musculoskeletal Back pain?: Yes Joint pain?: No  Neurological Headaches?: No Dizziness?: Yes  Psychologic Depression?: No Anxiety?: No  Physical Exam: BP 145/81 mmHg  Pulse 87  Ht 6\' 2"  (1.88 m)  Wt 276 lb 12.8 oz (125.556 kg)  BMI 35.52 kg/m2  Constitutional:  Alert and oriented, No acute distress. HEENT: [place] AT, moist mucus membranes.  Trachea midline, no masses. Cardiovascular: No clubbing, cyanosis, or edema. Respiratory: Normal respiratory effort, no increased work of breathing. GI: Abdomen is soft, nontender, nondistended, no abdominal masses GU: No CVA tenderness.  Skin: No rashes, bruises or suspicious lesions. Lymph: No cervical or inguinal adenopathy. Neurologic: Grossly intact, no focal deficits, moving all 4 extremities. Psychiatric: Normal mood and affect.  Laboratory Data: Lab Results  Component Value Date   WBC 8.6 06/24/2015   HGB 14.0 06/24/2015   HCT 41.6 06/24/2015   MCV 78.2* 06/24/2015   PLT 129* 06/24/2015    Lab Results  Component Value Date   CREATININE 1.15 03/14/2014    No results found for: PSA  No results found for: TESTOSTERONE  No results found for: HGBA1C  Urinalysis No  results found for: COLORURINE, APPEARANCEUR, LABSPEC, PHURINE, GLUCOSEU, HGBUR, BILIRUBINUR, KETONESUR, PROTEINUR, UROBILINOGEN, NITRITE, LEUKOCYTESUR   Assessment & Plan:    1. Prostate cancer The patient is status post IMRT which was completed in August 2016 for Gleason 3+4 = 7 prostate cancer. He is due for PSA in January 2017.  2. Nephrolithiasis We'll plan to get a KUB and ultrasound to assess his residual stone burden. At last imaging, he had stones within his bladder and left lower pole of his kidney. His stones are calcium oxalate based.   Return in about 4 weeks (around 11/26/2015) for KUB/renal ultrasound prior.  Nickie Retort, [facility] [address]  [address] [place], [place] [address] [phone]

## 2015-11-08 ENCOUNTER — Other Ambulatory Visit: Payer: Self-pay

## 2015-11-08 DIAGNOSIS — N200 Calculus of kidney: Secondary | ICD-10-CM

## 2015-11-16 ENCOUNTER — Ambulatory Visit
Admission: RE | Admit: 2015-11-16 | Discharge: 2015-11-16 | Disposition: A | Discharge status: Home / Self Care | Payer: No Typology Code available for payment source | Source: Ambulatory Visit | Attending: Urology | Admitting: Urology

## 2015-11-16 DIAGNOSIS — N200 Calculus of kidney: Secondary | ICD-10-CM | POA: Diagnosis not present

## 2015-11-25 ENCOUNTER — Encounter: Payer: Self-pay | Admitting: Urology

## 2015-11-25 ENCOUNTER — Ambulatory Visit (INDEPENDENT_AMBULATORY_CARE_PROVIDER_SITE_OTHER): Payer: No Typology Code available for payment source | Admitting: Urology

## 2015-11-25 VITALS — BP 127/79 | HR 76 | Ht 74.0 in | Wt 273.3 lb

## 2015-11-25 DIAGNOSIS — N200 Calculus of kidney: Secondary | ICD-10-CM | POA: Diagnosis not present

## 2015-11-25 LAB — URINALYSIS, COMPLETE
BILIRUBIN UA: NEGATIVE
Glucose, UA: NEGATIVE
Ketones, UA: NEGATIVE
LEUKOCYTES UA: NEGATIVE
Nitrite, UA: NEGATIVE
PH UA: 5.5 (ref 5.0–7.5)
PROTEIN UA: NEGATIVE
RBC UA: NEGATIVE
Specific Gravity, UA: >1.03 — ABNORMAL HIGH (ref 1.005–1.030)
Urobilinogen, Ur: 0.2 mg/dL (ref 0.2–1.0)

## 2015-11-25 LAB — MICROSCOPIC EXAMINATION
Epithelial Cells (non renal): NONE SEEN /hpf (ref 0–10)
WBC, UA: NONE SEEN /hpf (ref 0–?)

## 2015-11-25 NOTE — Progress Notes (Signed)
11/25/2015 10:51 AM   Warren Cera Chatmon Sr. [birth year] CN:[id number]  Referring provider: [name], MD [address] [place], [place] [address]  Chief Complaint  Patient presents with  . New Patient (Initial Visit)    urinary incontinence     HPI: The patient is a 64 year old gentleman presents for follow-up of prostate cancer and nephrolithiasis.  1. Prostate cancer The patient underwent IMRT which was completed in August 2016. He is due for a PSA and January 2017. In regards to pathology, he had Gleason 3+4 involving 6% of the tissue at the right lateral base Gleason 3+3 involving 60% of the tissue in the right lateral mid prostate for total of 2 of 12 cores. His transrectal ultrasound volume was 103 cc. His pretreatment PSA was 6.05. He is doing well after his radiation with occasional diarrhea. He notes no other problems from the radiation therapy at this time.  2. Nephrolithiasis He underwent a hematuria workup which revealed nephrolithiasis. The CT urogram revealed multiple small bladder calculi as well as nonobstructive left kidney lower pole calculi. There is also mild left hydroureteronephrosis without filling defect that was concerning for possibly recently passed stone. Repeat ultrasound did show only mild pelviectasis. His stones were calcium oxalate. He feels he has passed the stones since we last saw him. He has had no stone pain. Renal ultrasound today shows no hydronephrosis and no stones.   PMH: Past Medical History  Diagnosis Date  . Prostate cancer ([name])   . Hypertension   . Arthritis   . Gout   . Stroke ([name])   . Diabetes mellitus without complication ([name])   . BPH (benign prostatic hyperplasia)   . Arteriosclerosis of coronary artery 10/29/2015    Overview:  with mi     Surgical History: No past surgical history on file.  Home Medications:    Medication List       This list is accurate as of: 11/25/15 10:51 AM.  Always use your most  recent med list.               amLODipine 5 MG tablet  Commonly known as:  NORVASC     aspirin 325 MG tablet  Take by mouth.     atorvastatin 20 MG tablet  Commonly known as:  LIPITOR  Take 30 mg by mouth daily.     cloNIDine 0.1 MG tablet  Commonly known as:  CATAPRES     colchicine 0.6 MG tablet  Reported on 11/25/2015     cyclobenzaprine 10 MG tablet  Commonly known as:  FLEXERIL  Take by mouth.     finasteride 5 MG tablet  Commonly known as:  PROSCAR  Take 5 mg by mouth daily.     glipiZIDE 5 MG 24 hr tablet  Commonly known as:  GLUCOTROL XL     hydrochlorothiazide 25 MG tablet  Commonly known as:  HYDRODIURIL     meclizine 25 MG tablet  Commonly known as:  ANTIVERT     metFORMIN 1000 MG (MOD) 24 hr tablet  Commonly known as:  GLUMETZA     metoprolol 200 MG 24 hr tablet  Commonly known as:  TOPROL-XL     omeprazole 40 MG capsule  Commonly known as:  PRILOSEC  Take by mouth. Reported on 11/25/2015     potassium chloride SA 20 MEQ tablet  Commonly known as:  K-DUR,KLOR-CON     ramipril 10 MG capsule  Commonly known as:  ALTACE  rOPINIRole 0.25 MG tablet  Commonly known as:  REQUIP     tamsulosin 0.4 MG Caps capsule  Commonly known as:  FLOMAX  Take 0.4 mg by mouth daily.     Turmeric 450 MG Caps  Take 1 capsule by mouth daily.     vitamin B-12 100 MCG tablet  Commonly known as:  CYANOCOBALAMIN  Take 100 mcg by mouth daily.     vitamin C 1000 MG tablet  Take 1,000 mg by mouth daily.     Vitamin D3 1000 UNITS Caps  Take 1 capsule by mouth daily.        Allergies:  Allergies  Allergen Reactions  . Pravastatin Nausea And Vomiting    Other reaction(s): Unknown  . Statins Rash    Family History: No family history on file.  Social History:  reports that he has quit smoking. He has never used smokeless tobacco. His alcohol and drug histories are not on file.  ROS: UROLOGY Frequent Urination?: No Hard to postpone urination?:  No Burning/pain with urination?: No Get up at night to urinate?: No Leakage of urine?: No Urine stream starts and stops?: No Trouble starting stream?: No Do you have to strain to urinate?: No Blood in urine?: No Urinary tract infection?: No Sexually transmitted disease?: No Injury to kidneys or bladder?: No Painful intercourse?: No Weak stream?: No Erection problems?: No Penile pain?: No  Gastrointestinal Nausea?: No Vomiting?: No Indigestion/heartburn?: No Diarrhea?: No Constipation?: No  Constitutional Fever: No Night sweats?: No Weight loss?: No Fatigue?: No  Skin Skin rash/lesions?: No Itching?: No  Eyes Blurred vision?: No Double vision?: No  Ears/Nose/Throat Sore throat?: No Sinus problems?: No  Hematologic/Lymphatic Swollen glands?: No Easy bruising?: No  Cardiovascular Leg swelling?: No Chest pain?: No  Respiratory Cough?: No Shortness of breath?: No  Endocrine Excessive thirst?: No  Musculoskeletal Back pain?: No Joint pain?: Yes  Neurological Headaches?: No Dizziness?: No  Psychologic Depression?: No Anxiety?: No  Physical Exam: BP 127/79 mmHg  Pulse 76  Ht 6\' 2"  (1.88 m)  Wt 273 lb 4.8 oz (123.968 kg)  BMI 35.07 kg/m2  Constitutional:  Alert and oriented, No acute distress. HEENT: [place] AT, moist mucus membranes.  Trachea midline, no masses. Cardiovascular: No clubbing, cyanosis, or edema. Respiratory: Normal respiratory effort, no increased work of breathing. GI: Abdomen is soft, nontender, nondistended, no abdominal masses GU: No CVA tenderness.  Skin: No rashes, bruises or suspicious lesions. Lymph: No cervical or inguinal adenopathy. Neurologic: Grossly intact, no focal deficits, moving all 4 extremities. Psychiatric: Normal mood and affect.  Laboratory Data: Lab Results  Component Value Date   WBC 8.6 06/24/2015   HGB 14.0 06/24/2015   HCT 41.6 06/24/2015   MCV 78.2* 06/24/2015   PLT 129* 06/24/2015    Lab  Results  Component Value Date   CREATININE 1.15 03/14/2014    No results found for: PSA  No results found for: TESTOSTERONE  No results found for: HGBA1C  Urinalysis No results found for: COLORURINE, APPEARANCEUR, LABSPEC, PHURINE, GLUCOSEU, HGBUR, BILIRUBINUR, KETONESUR, PROTEINUR, UROBILINOGEN, NITRITE, LEUKOCYTESUR  Pertinent Imaging: IMPRESSION: There is mild residual caliectasis on the left without evidence of hydronephrosis. Bilateral ureteral jets are demonstrated. The right kidney and the urinary bladder are unremarkable.  Assessment & Plan:    1. Prostate cancer The patient is status post IMRT which was completed in August 2016 for Gleason 3+4 = 7 prostate cancer. He is due for PSA in January 2017 when he is follow up with  radiation oncology. Follow up with urology in 6 months  2. Nephrolithiasis The patient's renal ultrasound was normal showing no stones. No evidence of disease in this time.  No Follow-up on file.  Nickie Retort, MD  [facility] [address], [place], [place] [address] [phone]

## 2015-12-30 ENCOUNTER — Other Ambulatory Visit: Payer: Self-pay | Admitting: *Deleted

## 2015-12-30 ENCOUNTER — Inpatient Hospital Stay: Payer: BLUE CROSS/BLUE SHIELD | Attending: Radiation Oncology

## 2015-12-30 ENCOUNTER — Ambulatory Visit
Admission: RE | Admit: 2015-12-30 | Discharge: 2015-12-30 | Disposition: A | Discharge status: Home / Self Care | Payer: No Typology Code available for payment source | Source: Ambulatory Visit | Attending: Radiation Oncology | Admitting: Radiation Oncology

## 2015-12-30 ENCOUNTER — Encounter: Payer: Self-pay | Admitting: Radiation Oncology

## 2015-12-30 VITALS — BP 147/81 | HR 88 | Temp 96.9°F | Resp 18 | Wt 277.8 lb

## 2015-12-30 DIAGNOSIS — C61 Malignant neoplasm of prostate: Secondary | ICD-10-CM | POA: Diagnosis not present

## 2015-12-30 LAB — PSA: PSA: 0.17 ng/mL (ref 0.00–4.00)

## 2015-12-30 NOTE — Progress Notes (Signed)
Radiation Oncology Follow up Note  Name: Warren Stanczak Placzek Sr.   Date:   12/30/2015 MRN:  [id number] DOB: March 03, 1951    This 65 y.o. male presents to the clinic today for follow-up for prostate cancer stage IIa presenting with a PSA of 6 now out 5 months having completed IM RT radiation therapy.  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 He is seen today in routine follow-up and is doing well. He had a PSA test performed today which will he reported separately. He specifically denies any lower urinary tract symptoms diarrhea or dysuria.Warren Stone He is being followed by nephrolithiasis.  COMPLICATIONS OF TREATMENT: none  FOLLOW UP COMPLIANCE: keeps appointments   PHYSICAL EXAM:  BP 147/81 mmHg  Pulse 88  Temp(Src) 96.9 F (36.1 C)  Resp 18  Wt 277 lb 12.5 oz (126 kg) On rectal exam rectal sphincter tone is good. Prostate is smooth contracted without evidence of nodularity or mass. Sulcus is preserved bilaterally. No discrete nodularity is identified. No other rectal abnormalities are noted. Well-developed well-nourished patient in NAD. HEENT reveals PERLA, EOMI, discs not visualized.  Oral cavity is clear. No oral mucosal lesions are identified. Neck is clear without evidence of cervical or supraclavicular adenopathy. Lungs are clear to A&P. Cardiac examination is essentially unremarkable with regular rate and rhythm without murmur rub or thrill. Abdomen is benign with no organomegaly or masses noted. Motor sensory and DTR levels are equal and symmetric in the upper and lower extremities. Cranial nerves II through XII are grossly intact. Proprioception is intact. No peripheral adenopathy or edema is identified. No motor or sensory levels are noted. Crude visual fields are within normal  range.  RADIOLOGY RESULTS: Patient recently had renal ultrasound showing no evidence of stones  PLAN: Present time patient is doing well now 5 months out from I MRT radiation therapy for prostate cancer. I've performed PSA level today and will report that separately otherwise I'm please was overall progress. I've asked to see him back in 6 months for follow-up. Patient knows to call sooner with any concerns.  I would like to take this opportunity for allowing me to participate in the care of your patient.Armstead Peaks., MD

## 2016-02-09 ENCOUNTER — Telehealth: Payer: Self-pay

## 2016-02-09 DIAGNOSIS — N400 Enlarged prostate without lower urinary tract symptoms: Secondary | ICD-10-CM

## 2016-02-09 NOTE — Telephone Encounter (Signed)
Pt pharmacy sent a refill of finasteride. Wasn't sure if pt still needed medication with a dx of prostate cancer. Please advise.

## 2016-02-10 NOTE — Telephone Encounter (Signed)
Ok to refill finasteride.

## 2016-02-11 MED ORDER — FINASTERIDE 5 MG PO TABS: 5.0000 mg | ORAL_TABLET | Freq: Every day | ORAL | Status: DC

## 2016-02-11 NOTE — Telephone Encounter (Signed)
Medication sent to pharmacy  

## 2016-02-15 ENCOUNTER — Other Ambulatory Visit: Payer: Self-pay

## 2016-02-15 ENCOUNTER — Telehealth: Payer: Self-pay | Admitting: Urology

## 2016-02-15 DIAGNOSIS — N400 Enlarged prostate without lower urinary tract symptoms: Secondary | ICD-10-CM

## 2016-02-15 MED ORDER — FINASTERIDE 5 MG PO TABS: 5.0000 mg | ORAL_TABLET | Freq: Every day | ORAL | Status: DC

## 2016-02-15 NOTE — Telephone Encounter (Signed)
Patient returned call to the office.  I advised him that the Finasteride was sent to [facility] in [place].    Patient advised that this is the [employer].  He would like the prescription sent to the [facility] in [place].

## 2016-02-15 NOTE — Progress Notes (Signed)
Medication has been sent to [facility] and pharmacy corrected in system.

## 2016-02-23 ENCOUNTER — Other Ambulatory Visit: Payer: Self-pay | Admitting: Obstetrics and Gynecology

## 2016-02-23 MED ORDER — TAMSULOSIN HCL 0.4 MG PO CAPS
0.4000 mg | ORAL_CAPSULE | Freq: Every day | ORAL | Status: DC
Start: 2016-02-23 — End: 2017-03-28

## 2016-05-25 ENCOUNTER — Ambulatory Visit (INDEPENDENT_AMBULATORY_CARE_PROVIDER_SITE_OTHER): Payer: Medicare Other | Admitting: Urology

## 2016-05-25 ENCOUNTER — Encounter: Payer: Self-pay | Admitting: Urology

## 2016-05-25 VITALS — BP 128/71 | HR 70 | Temp 98.1°F | Ht 74.0 in | Wt 275.4 lb

## 2016-05-25 DIAGNOSIS — N200 Calculus of kidney: Secondary | ICD-10-CM | POA: Diagnosis not present

## 2016-05-25 DIAGNOSIS — Z8546 Personal history of malignant neoplasm of prostate: Secondary | ICD-10-CM | POA: Diagnosis not present

## 2016-05-25 NOTE — Progress Notes (Signed)
05/25/2016 11:45 AM   Warren Cera Kleist Sr. 05-20-1951 [id number]  Referring provider: [name], MD [address] [place], [place] [address]  Chief Complaint  Patient presents with  . Prostate Cancer    F/U  . Nephrolithiasis    F/U    HPI: The patient is a 65 year old gentleman presents for follow-up of prostate cancer and nephrolithiasis.  1. Prostate cancer The patient underwent IMRT which was completed in August 2016. In regards to pathology, he had Gleason 3+4 involving 6% of the tissue at the right lateral base Gleason 3+3 involving 60% of the tissue in the right lateral mid prostate for total of 2 of 12 cores. His transrectal ultrasound volume was 103 cc. His pretreatment PSA was 6.05. He is doing well after his radiation with occasional diarrhea. He notes no other problems from the radiation therapy at this time. His PSA was 0.17 in January 2017. He is scheduled for a PSA check with Dr. [name] next month.  2. Nephrolithiasis He underwent a hematuria workup which revealed nephrolithiasis. The CT urogram revealed multiple small bladder calculi as well as nonobstructive left kidney lower pole calculi. There is also mild left hydroureteronephrosis without filling defect that was concerning for possibly recently passed stone. Repeat ultrasound did show only mild pelviectasis. His stones were calcium oxalate. He feels he has passed the stones since we last saw him. He has had no stone pain. Renal ultrasound today shows no hydronephrosis and no stones.    PMH: Past Medical History  Diagnosis Date  . Prostate cancer ([place])   . Hypertension   . Arthritis   . Gout   . Stroke ([place])   . Diabetes mellitus without complication ([place])   . BPH (benign prostatic hyperplasia)   . Arteriosclerosis of coronary artery 10/29/2015    Overview:  with mi     Surgical History: History reviewed. No pertinent past surgical history.  Home Medications:    Medication List      This list is accurate as of: 05/25/16 11:45 AM.  Always use your most recent med list.               amLODipine 5 MG tablet  Commonly known as:  NORVASC     aspirin 325 MG tablet  Take by mouth.     atorvastatin 20 MG tablet  Commonly known as:  LIPITOR  Take 30 mg by mouth daily.     cloNIDine 0.1 MG tablet  Commonly known as:  CATAPRES     colchicine 0.6 MG tablet  Reported on 11/25/2015     cyclobenzaprine 10 MG tablet  Commonly known as:  FLEXERIL  Take 10 mg by mouth 3 (three) times daily as needed.     finasteride 5 MG tablet  Commonly known as:  PROSCAR  Take 1 tablet (5 mg total) by mouth daily.     glipiZIDE 5 MG 24 hr tablet  Commonly known as:  GLUCOTROL XL     hydrochlorothiazide 25 MG tablet  Commonly known as:  HYDRODIURIL     meclizine 25 MG tablet  Commonly known as:  ANTIVERT  12.5 mg.     metFORMIN 500 MG 24 hr tablet  Commonly known as:  GLUCOPHAGE-XR  Take 500 mg by mouth daily.     metoprolol 200 MG 24 hr tablet  Commonly known as:  TOPROL-XL     potassium chloride SA 20 MEQ tablet  Commonly known as:  K-DUR,KLOR-CON     ramipril  10 MG capsule  Commonly known as:  ALTACE     rOPINIRole 0.25 MG tablet  Commonly known as:  REQUIP     tamsulosin 0.4 MG Caps capsule  Commonly known as:  FLOMAX  Take 1 capsule (0.4 mg total) by mouth daily.     Turmeric 450 MG Caps  Take 1 capsule by mouth daily.     vitamin B-12 100 MCG tablet  Commonly known as:  CYANOCOBALAMIN  Take 100 mcg by mouth daily.     vitamin C 1000 MG tablet  Take 1,000 mg by mouth daily.     Vitamin D3 1000 units Caps  Take 1 capsule by mouth daily.        Allergies:  Allergies  Allergen Reactions  . Pravastatin Nausea And Vomiting    Other reaction(s): Unknown  . Statins Rash    Family History: History reviewed. No pertinent family history.  Social History:  reports that he quit smoking about 20 years ago. He has never used smokeless tobacco. He  reports that he does not drink alcohol or use illicit drugs.  ROS: UROLOGY Frequent Urination?: No Hard to postpone urination?: No Burning/pain with urination?: No Get up at night to urinate?: No Leakage of urine?: No Urine stream starts and stops?: No Trouble starting stream?: No Do you have to strain to urinate?: No Blood in urine?: No Urinary tract infection?: No Sexually transmitted disease?: No Injury to kidneys or bladder?: No Painful intercourse?: No Weak stream?: No Erection problems?: Yes Penile pain?: No  Gastrointestinal Nausea?: No Vomiting?: No Indigestion/heartburn?: No Diarrhea?: No Constipation?: No  Constitutional Fever: No Night sweats?: Yes Weight loss?: No Fatigue?: Yes  Skin Skin rash/lesions?: No Itching?: No  Eyes Blurred vision?: No Double vision?: No  Ears/Nose/Throat Sore throat?: No Sinus problems?: No  Hematologic/Lymphatic Swollen glands?: No Easy bruising?: No  Cardiovascular Leg swelling?: Yes Chest pain?: No  Respiratory Cough?: No Shortness of breath?: No  Endocrine Excessive thirst?: No  Musculoskeletal Back pain?: No Joint pain?: Yes  Neurological Headaches?: No Dizziness?: No  Psychologic Depression?: No Anxiety?: No  Physical Exam: BP 128/71 mmHg  Pulse 70  Temp(Src) 98.1 F (36.7 C)  Ht 6\' 2"  (1.88 m)  Wt 275 lb 6.4 oz (124.921 kg)  BMI 35.34 kg/m2  Constitutional:  Alert and oriented, No acute distress. HEENT: [place] AT, moist mucus membranes.  Trachea midline, no masses. Cardiovascular: No clubbing, cyanosis, or edema. Respiratory: Normal respiratory effort, no increased work of breathing. GI: Abdomen is soft, nontender, nondistended, no abdominal masses GU: No CVA tenderness.  Skin: No rashes, bruises or suspicious lesions. Lymph: No cervical or inguinal adenopathy. Neurologic: Grossly intact, no focal deficits, moving all 4 extremities. Psychiatric: Normal mood and affect.  Laboratory  Data: Lab Results  Component Value Date   WBC 8.6 06/24/2015   HGB 14.0 06/24/2015   HCT 41.6 06/24/2015   MCV 78.2* 06/24/2015   PLT 129* 06/24/2015    Lab Results  Component Value Date   CREATININE 1.15 03/14/2014    Lab Results  Component Value Date   PSA 0.17 12/30/2015    No results found for: TESTOSTERONE  No results found for: HGBA1C  Urinalysis    Component Value Date/Time   APPEARANCEUR Clear 11/25/2015 1043   GLUCOSEU Negative 11/25/2015 1043   BILIRUBINUR Negative 11/25/2015 1043   PROTEINUR Negative 11/25/2015 1043   NITRITE Negative 11/25/2015 1043   LEUKOCYTESUR Negative 11/25/2015 1043    Assessment & Plan:    1.  Prostate cancer The patient is status post IMRT which was completed in August 2016 for Gleason 3+4 = 7 prostate cancer. He is due for PSA in July 2017 when he is scheduled to follow up with Dr. [name]. We will schedule for him to follow-up in 7 months with a PSA prior as this will make him 6 months from when Dr. [name] is scheduled to check his PSA.  2. Nephrolithiasis No current evidence of disease  There are no diagnoses linked to this encounter.  No Follow-up on file.  Nickie Retort, MD  [facility] [address] [place], [place] [address] [phone]

## 2016-06-22 ENCOUNTER — Other Ambulatory Visit: Payer: Self-pay | Admitting: *Deleted

## 2016-06-22 DIAGNOSIS — C61 Malignant neoplasm of prostate: Secondary | ICD-10-CM

## 2016-06-29 ENCOUNTER — Inpatient Hospital Stay: Payer: Medicare Other | Attending: Radiation Oncology

## 2016-06-29 ENCOUNTER — Encounter: Payer: Self-pay | Admitting: Radiation Oncology

## 2016-06-29 ENCOUNTER — Ambulatory Visit
Admission: RE | Admit: 2016-06-29 | Discharge: 2016-06-29 | Disposition: A | Discharge status: Home / Self Care | Payer: Medicare Other | Source: Ambulatory Visit | Attending: Radiation Oncology | Admitting: Radiation Oncology

## 2016-06-29 VITALS — BP 127/73 | HR 71 | Resp 18 | Wt 272.5 lb

## 2016-06-29 DIAGNOSIS — K5900 Constipation, unspecified: Secondary | ICD-10-CM | POA: Insufficient documentation

## 2016-06-29 DIAGNOSIS — C61 Malignant neoplasm of prostate: Secondary | ICD-10-CM | POA: Insufficient documentation

## 2016-06-29 DIAGNOSIS — Z923 Personal history of irradiation: Secondary | ICD-10-CM | POA: Diagnosis not present

## 2016-06-29 DIAGNOSIS — R197 Diarrhea, unspecified: Secondary | ICD-10-CM | POA: Diagnosis not present

## 2016-06-29 LAB — PSA: PSA: 0.16 ng/mL (ref 0.00–4.00)

## 2016-06-29 NOTE — Progress Notes (Signed)
Radiation Oncology Follow up Note  Name: Warren Boomer Alderman Sr.   Date:   06/29/2016 MRN:  [id number] DOB: Feb 19, 1951    This 65 y.o. male presents to the clinic today for 1 year follow-up for stage IIa prostate cancer.  REFERRING PROVIDER: [name], MD  HPI: Patient is a 65 year old male now out over a year having completed IM RT radiation therapy for stage IIa (T1 CN 0 M0) adenocarcinoma the prostate presenting with a Gleason score of 7 (3+4) and a PSA of 6. He is seen today in routine follow-up and is doing well. He does have some mild intermittent diarrhea also some constipation.[name] His last PSA 6 months prior was 0.17.  COMPLICATIONS OF TREATMENT: none  FOLLOW UP COMPLIANCE: keeps appointments   PHYSICAL EXAM:  BP 127/73 mmHg  Pulse 71  Resp 18  Wt 272 lb 7.8 oz (123.6 kg) On rectal exam rectal sphincter tone is good. Prostate is smooth contracted without evidence of nodularity or mass. Sulcus is preserved bilaterally. No discrete nodularity is identified. No other rectal abnormalities are noted. Well-developed well-nourished patient in NAD. HEENT reveals PERLA, EOMI, discs not visualized.  Oral cavity is clear. No oral mucosal lesions are identified. Neck is clear without evidence of cervical or supraclavicular adenopathy. Lungs are clear to A&P. Cardiac examination is essentially unremarkable with regular rate and rhythm without murmur rub or thrill. Abdomen is benign with no organomegaly or masses noted. Motor sensory and DTR levels are equal and symmetric in the upper and lower extremities. Cranial nerves II through XII are grossly intact. Proprioception is intact. No peripheral adenopathy or edema is identified. No motor or sensory levels are noted. Crude visual fields are within normal range.  RADIOLOGY RESULTS: No current films for review  PLAN: At the present time patient is doing well I've repeated his PSA level today. Will report that separately. Otherwise I've asked  to see him back in 1 year for follow-up. I've also recommended someActivia yogurt to try to stabilize his bowel pattern. Patient is to call sooner with any concerns. I've set him up for 1 year follow-up.  I would like to take this opportunity to thank you for allowing me to participate in the care of your patient.Armstead Peaks., MD

## 2016-12-22 ENCOUNTER — Other Ambulatory Visit: Payer: Medicare Other

## 2016-12-22 DIAGNOSIS — Z8546 Personal history of malignant neoplasm of prostate: Secondary | ICD-10-CM

## 2016-12-23 LAB — PSA: PROSTATE SPECIFIC AG, SERUM: 0.1 ng/mL (ref 0.0–4.0)

## 2016-12-29 ENCOUNTER — Encounter: Payer: Self-pay | Admitting: Urology

## 2016-12-29 ENCOUNTER — Ambulatory Visit (INDEPENDENT_AMBULATORY_CARE_PROVIDER_SITE_OTHER): Payer: Medicare Other | Admitting: Urology

## 2016-12-29 VITALS — BP 138/76 | HR 82 | Ht 74.0 in | Wt 279.2 lb

## 2016-12-29 DIAGNOSIS — C61 Malignant neoplasm of prostate: Secondary | ICD-10-CM

## 2016-12-29 NOTE — Progress Notes (Signed)
12/29/2016 2:08 PM   Warren Cera Rambo Sr. 01-24-1951 [id number]  Referring provider: [name], MD [address] [place], [place] [address]  Chief Complaint  Patient presents with  . Follow-up    history prostate cancer     HPI: The patient is a 66 year old gentleman presents for follow-up of prostate cancer and nephrolithiasis.  1. Prostate cancer The patient underwent IMRT which was completed in August 2016. In regards to pathology, he had Gleason 3+4 involving 6% of the tissue at the right lateral base Gleason 3+3 involving 60% of the tissue in the right lateral mid prostate for total of 2 of 12 cores. His transrectal ultrasound volume was 103 cc. His pretreatment PSA was 6.05. He is doing well after his radiation with occasional diarrhea. He notes no other problems from the radiation therapy at this time. His PSA was 0.17 in January 2017. It was 0.16 in July 2017. It was 0.1 in January 2018.  His only complaint at this time is diarrhea that he has had since his radiation. He has tried probiotics which have helped with this. He has no other complaints this time. He is voiding well without difficulty.  PMH: Past Medical History:  Diagnosis Date  . Arteriosclerosis of coronary artery 10/29/2015   Overview:  with mi   . Arthritis   . BPH (benign prostatic hyperplasia)   . Diabetes mellitus without complication ([place])   . Gout   . Hypertension   . Prostate cancer ([place])   . Stroke [facility])     Surgical History: No past surgical history on file.  Home Medications:  Allergies as of 12/29/2016      Reactions   Pravastatin Nausea And Vomiting   Other reaction(s): Unknown   Statins Rash      Medication List       Accurate as of 12/29/16  2:08 PM. Always use your most recent med list.          amLODipine 5 MG tablet Commonly known as:  NORVASC Take 5 mg by mouth daily.   aspirin 325 MG tablet Take 325 mg by mouth daily.   atorvastatin 20 MG  tablet Commonly known as:  LIPITOR Take 30 mg by mouth daily.   cloNIDine 0.1 MG tablet Commonly known as:  CATAPRES Take 0.1 mg by mouth 2 (two) times daily.   colchicine 0.6 MG tablet Take 0.6 mg by mouth as needed. Reported on 11/25/2015   cyclobenzaprine 10 MG tablet Commonly known as:  FLEXERIL Take 10 mg by mouth 3 (three) times daily as needed.   finasteride 5 MG tablet Commonly known as:  PROSCAR Take 1 tablet (5 mg total) by mouth daily.   glipiZIDE 5 MG 24 hr tablet Commonly known as:  GLUCOTROL XL Take 5 mg by mouth daily with breakfast.   hydrochlorothiazide 25 MG tablet Commonly known as:  HYDRODIURIL Take 25 mg by mouth daily.   IRON SUPPLEMENT 325 (65 FE) MG tablet Generic drug:  ferrous sulfate Take 325 mg by mouth daily with breakfast.   meclizine 25 MG tablet Commonly known as:  ANTIVERT 12.5 mg.   metFORMIN 500 MG 24 hr tablet Commonly known as:  GLUCOPHAGE-XR Take 1,000 mg by mouth 2 (two) times daily.   metoprolol 200 MG 24 hr tablet Commonly known as:  TOPROL-XL Take 200 mg by mouth daily.   potassium chloride SA 20 MEQ tablet Commonly known as:  K-DUR,KLOR-CON Take 20 mEq by mouth daily.   ramipril 10  MG capsule Commonly known as:  ALTACE Take 10 mg by mouth daily.   rOPINIRole 0.25 MG tablet Commonly known as:  REQUIP Take 0.5 mg by mouth at bedtime.   tamsulosin 0.4 MG Caps capsule Commonly known as:  FLOMAX Take 1 capsule (0.4 mg total) by mouth daily.   vitamin B-12 100 MCG tablet Commonly known as:  CYANOCOBALAMIN Take 100 mcg by mouth daily.   vitamin C 1000 MG tablet Take 1,000 mg by mouth daily.   Vitamin D3 1000 units Caps Take 1 capsule by mouth daily.       Allergies:  Allergies  Allergen Reactions  . Pravastatin Nausea And Vomiting    Other reaction(s): Unknown  . Statins Rash    Family History: No family history on file.  Social History:  reports that he quit smoking about 21 years ago. He has never  used smokeless tobacco. He reports that he does not drink alcohol or use drugs.  ROS: UROLOGY Frequent Urination?: No Hard to postpone urination?: No Burning/pain with urination?: No Get up at night to urinate?: No Leakage of urine?: No Urine stream starts and stops?: No Trouble starting stream?: No Do you have to strain to urinate?: No Blood in urine?: No Urinary tract infection?: No Sexually transmitted disease?: No Injury to kidneys or bladder?: No Painful intercourse?: No Weak stream?: No Erection problems?: Yes Penile pain?: No  Gastrointestinal Nausea?: No Vomiting?: No Indigestion/heartburn?: No Diarrhea?: Yes Constipation?: No  Constitutional Fever: No Night sweats?: No Weight loss?: No Fatigue?: Yes  Skin Skin rash/lesions?: No Itching?: No  Eyes Blurred vision?: No Double vision?: No  Ears/Nose/Throat Sore throat?: No Sinus problems?: No  Hematologic/Lymphatic Swollen glands?: No Easy bruising?: No  Cardiovascular Leg swelling?: No Chest pain?: No  Respiratory Cough?: No Shortness of breath?: No  Endocrine Excessive thirst?: No  Musculoskeletal Back pain?: Yes  Neurological Headaches?: No Dizziness?: No  Psychologic Depression?: No Anxiety?: No  Physical Exam: BP 138/76   Pulse 82   Ht 6\' 2"  (1.88 m)   Wt 279 lb 3.2 oz (126.6 kg)   BMI 35.85 kg/m   Constitutional:  Alert and oriented, No acute distress. HEENT: [place] AT, moist mucus membranes.  Trachea midline, no masses. Cardiovascular: No clubbing, cyanosis, or edema. Respiratory: Normal respiratory effort, no increased work of breathing. GI: Abdomen is soft, nontender, nondistended, no abdominal masses GU: No CVA tenderness.  Skin: No rashes, bruises or suspicious lesions. Lymph: No cervical or inguinal adenopathy. Neurologic: Grossly intact, no focal deficits, moving all 4 extremities. Psychiatric: Normal mood and affect.  Laboratory Data: Lab Results  Component  Value Date   WBC 8.6 06/24/2015   HGB 14.0 06/24/2015   HCT 41.6 06/24/2015   MCV 78.2 (L) 06/24/2015   PLT 129 (L) 06/24/2015    Lab Results  Component Value Date   CREATININE 1.15 03/14/2014    Lab Results  Component Value Date   PSA 0.16 06/29/2016   PSA 0.17 12/30/2015    No results found for: TESTOSTERONE  No results found for: HGBA1C  Urinalysis    Component Value Date/Time   APPEARANCEUR Clear 11/25/2015 1043   GLUCOSEU Negative 11/25/2015 1043   BILIRUBINUR Negative 11/25/2015 1043   PROTEINUR Negative 11/25/2015 1043   NITRITE Negative 11/25/2015 1043   LEUKOCYTESUR Negative 11/25/2015 1043     Assessment & Plan:    1. Prostate cancer I'd like to recheck the patient's PSA in approximately 6 months, however he is already seeing Dr. [name] who  is checking his PSA at that time. The patient is requesting not to follow up at that time since he is already seeing Dr. [name] for a PSA check in 6 monts. I think that is reasonable as long as it is being checked. I'll have him follow-up in one year with a PSA prior in our office. He will follow up sooner if he has any issues.  Return in about 1 year (around 12/29/2017) for PSA prior.  Nickie Retort, MD  [facility] [address] [place], [place] [address] [phone]

## 2017-02-20 ENCOUNTER — Other Ambulatory Visit: Payer: Self-pay | Admitting: *Deleted

## 2017-02-20 ENCOUNTER — Telehealth: Payer: Self-pay | Admitting: *Deleted

## 2017-02-20 DIAGNOSIS — C61 Malignant neoplasm of prostate: Secondary | ICD-10-CM

## 2017-02-20 NOTE — Telephone Encounter (Signed)
Spoke to Mrs. Ricotta to give appointments for MRI and follow up with Dr. [name].  Mrs. Tenna Child readback the appointments and times.

## 2017-02-27 ENCOUNTER — Other Ambulatory Visit: Payer: Self-pay | Admitting: *Deleted

## 2017-02-27 ENCOUNTER — Ambulatory Visit
Admission: RE | Admit: 2017-02-27 | Discharge: 2017-02-27 | Disposition: A | Discharge status: Home / Self Care | Payer: Medicare Other | Source: Ambulatory Visit | Attending: Radiation Oncology | Admitting: Radiation Oncology

## 2017-02-27 DIAGNOSIS — M5126 Other intervertebral disc displacement, lumbar region: Secondary | ICD-10-CM | POA: Insufficient documentation

## 2017-02-27 DIAGNOSIS — C61 Malignant neoplasm of prostate: Secondary | ICD-10-CM

## 2017-02-27 DIAGNOSIS — M48061 Spinal stenosis, lumbar region without neurogenic claudication: Secondary | ICD-10-CM | POA: Diagnosis not present

## 2017-02-27 MED ORDER — GADOBENATE DIMEGLUMINE 529 MG/ML IV SOLN
20.0000 mL | Freq: Once | INTRAVENOUS | Status: AC | PRN
  Administered 2017-02-27: 20 mL via INTRAVENOUS

## 2017-03-01 ENCOUNTER — Encounter: Payer: Self-pay | Admitting: Radiation Oncology

## 2017-03-01 ENCOUNTER — Ambulatory Visit
Admission: RE | Admit: 2017-03-01 | Discharge: 2017-03-01 | Disposition: A | Discharge status: Home / Self Care | Payer: Medicare Other | Source: Ambulatory Visit | Attending: Radiation Oncology | Admitting: Radiation Oncology

## 2017-03-01 VITALS — BP 109/68 | HR 74 | Temp 97.0°F | Resp 18 | Wt 270.7 lb

## 2017-03-01 DIAGNOSIS — C7951 Secondary malignant neoplasm of bone: Secondary | ICD-10-CM | POA: Diagnosis not present

## 2017-03-01 DIAGNOSIS — C61 Malignant neoplasm of prostate: Secondary | ICD-10-CM | POA: Diagnosis not present

## 2017-03-01 DIAGNOSIS — M48061 Spinal stenosis, lumbar region without neurogenic claudication: Secondary | ICD-10-CM | POA: Insufficient documentation

## 2017-03-01 DIAGNOSIS — Z923 Personal history of irradiation: Secondary | ICD-10-CM | POA: Insufficient documentation

## 2017-03-01 NOTE — Progress Notes (Signed)
Radiation Oncology Follow up Note  Name: Warren Rodriguez Mirarchi Sr.   Date:   03/01/2017 MRN:  [id number] DOB: [birth year]    This 66 y.o. male presents to the clinic today for follow-up for evaluation of MRI scan of his lumbar spine in patient with stage IIa prostate cancer.  REFERRING PROVIDER: [name], MD  HPI: Patient is a 66 year old male now out a year and a half having completed IM RT radiation therapy for stage IIa (T1 CN 0 M0) adenocarcinoma the prostate presenting the PSA of 7 patient was having back pain I ordered an MRI scan of his lumbar spine. MRI showed a lesion at L4 containing fat most likely a benign hemangioma. He also had some 6 slight right-sided disc protrusion at L4-5 and mild stenosis at L4-5. No evidence of metastatic disease. His PSAs in the undetectable range.  COMPLICATIONS OF TREATMENT: none  FOLLOW UP COMPLIANCE: keeps appointments   PHYSICAL EXAM:  BP 109/68   Pulse 74   Temp 97 F (36.1 C)   Resp 18   Wt 270 lb 11.6 oz (122.8 kg)   BMI 34.76 kg/m  Well-developed well-nourished patient in NAD. HEENT reveals PERLA, EOMI, discs not visualized.  Oral cavity is clear. No oral mucosal lesions are identified. Neck is clear without evidence of cervical or supraclavicular adenopathy. Lungs are clear to A&P. Cardiac examination is essentially unremarkable with regular rate and rhythm without murmur rub or thrill. Abdomen is benign with no organomegaly or masses noted. Motor sensory and DTR levels are equal and symmetric in the upper and lower extremities. Cranial nerves II through XII are grossly intact. Proprioception is intact. No peripheral adenopathy or edema is identified. No motor or sensory levels are noted. Crude visual fields are within normal range.  RADIOLOGY RESULTS: MRI scan is reviewed and compatible with the above-stated findings  PLAN: Present time patient is doing well from a prostate standpoint with no evidence of biochemical relapse. His MRI  scan does show some disc protrusion and I've offered referral to neurosurgery which she will decide upon. Otherwise he'll continue his work normal follow-ups in August 2018. Patient knows to call sooner with any concerns. He recently was on a prednisone Dosepak which is helped his pain significantly. Patient is to call with any concerns.  I would like to take this opportunity to thank you for allowing me to participate in the care of your patient.Armstead Peaks., MD

## 2017-03-09 ENCOUNTER — Encounter: Payer: Self-pay | Admitting: *Deleted

## 2017-03-26 ENCOUNTER — Telehealth: Payer: Self-pay

## 2017-03-26 DIAGNOSIS — N401 Enlarged prostate with lower urinary tract symptoms: Secondary | ICD-10-CM

## 2017-03-26 NOTE — Telephone Encounter (Signed)
Pt pharmacy sent a refill request of tamsulosin. I see that pt has prostate cancer and is seeing Dr. Donella Stade. I also saw that pt has a hx of kidney stones. Do you want pt to continue tamsulosin?

## 2017-03-28 MED ORDER — TAMSULOSIN HCL 0.4 MG PO CAPS: 0.4000 mg | ORAL_CAPSULE | Freq: Every day | ORAL | 12 refills | Status: DC

## 2017-03-28 NOTE — Telephone Encounter (Signed)
Medication sent to pt pharmacy 

## 2017-07-05 ENCOUNTER — Other Ambulatory Visit: Payer: Self-pay | Admitting: *Deleted

## 2017-07-05 ENCOUNTER — Inpatient Hospital Stay: Payer: Medicare Other | Attending: Radiation Oncology

## 2017-07-05 DIAGNOSIS — C61 Malignant neoplasm of prostate: Secondary | ICD-10-CM

## 2017-07-05 LAB — PSA: PROSTATIC SPECIFIC ANTIGEN: 0.08 ng/mL (ref 0.00–4.00)

## 2017-07-05 NOTE — Progress Notes (Unsigned)
PSA

## 2017-07-12 ENCOUNTER — Ambulatory Visit: Payer: Medicare Other | Admitting: Radiation Oncology

## 2017-07-19 ENCOUNTER — Encounter: Payer: Self-pay | Admitting: Radiation Oncology

## 2017-07-19 ENCOUNTER — Other Ambulatory Visit: Payer: Self-pay | Admitting: *Deleted

## 2017-07-19 ENCOUNTER — Ambulatory Visit
Admission: RE | Admit: 2017-07-19 | Discharge: 2017-07-19 | Disposition: A | Discharge status: Home / Self Care | Payer: Medicare Other | Source: Ambulatory Visit | Attending: Radiation Oncology | Admitting: Radiation Oncology

## 2017-07-19 VITALS — BP 125/68 | HR 70 | Temp 96.3°F | Resp 20 | Wt 273.0 lb

## 2017-07-19 DIAGNOSIS — Z923 Personal history of irradiation: Secondary | ICD-10-CM | POA: Insufficient documentation

## 2017-07-19 DIAGNOSIS — C61 Malignant neoplasm of prostate: Secondary | ICD-10-CM | POA: Diagnosis not present

## 2017-07-19 NOTE — Progress Notes (Signed)
Radiation Oncology Follow up Note  Name: Warren Mauss Depierro Sr.   Date:   07/19/2017 MRN:  [id number] DOB: Nov 04, 1951    This 66 y.o. male presents to the clinic today for 2 year follow-up status post I MRT for adenocarcinoma the prostate.  REFERRING PROVIDER: [name], MD  HPI: patient is a.66 year old male now seen out 2 years having completed IM RT radiation therapy for Gleason 7 (3+4) adenocarcinoma the prostate presenting the PSA of 6. He is seen today in routine follow-up is doing well. His back pain has improved. His most recent PSA last month was 0.08. He has stopped taking Flomax. Specifically denies any significant diarrhea dysuria or any other GI/GU complaints.  COMPLICATIONS OF TREATMENT: none  FOLLOW UP COMPLIANCE: keeps appointments   PHYSICAL EXAM:  BP 125/68   Pulse 70   Temp (!) 96.3 F (35.7 C)   Resp 20   Wt 273 lb 0.6 oz (123.9 kg)   BMI 35.06 kg/m  On rectal exam rectal sphincter tone is good. Prostate is smooth contracted without evidence of nodularity or mass. Sulcus is preserved bilaterally. No discrete nodularity is identified. No other rectal abnormalities are noted. Well-developed well-nourished patient in NAD. HEENT reveals PERLA, EOMI, discs not visualized.  Oral cavity is clear. No oral mucosal lesions are identified. Neck is clear without evidence of cervical or supraclavicular adenopathy. Lungs are clear to A&P. Cardiac examination is essentially unremarkable with regular rate and rhythm without murmur rub or thrill. Abdomen is benign with no organomegaly or masses noted. Motor sensory and DTR levels are equal and symmetric in the upper and lower extremities. Cranial nerves II through XII are grossly intact. Proprioception is intact. No peripheral adenopathy or edema is identified. No motor or sensory levels are noted. Crude visual fields are within normal range.  RADIOLOGY RESULTS: no current films for review  PLAN: present time patient  continues under excellent biochemical control of his prostate cancer. I'm please was overall progress. I've asked to see him back in 1 year for follow-up will obtain a PSA prior to that visit. Patient is to call sooner with any concerns.  I would like to take this opportunity to thank you for allowing me to participate in the care of your patient.Armstead Peaks., MD

## 2017-12-15 ENCOUNTER — Other Ambulatory Visit: Payer: Self-pay | Admitting: Urology

## 2017-12-15 DIAGNOSIS — N400 Enlarged prostate without lower urinary tract symptoms: Secondary | ICD-10-CM

## 2017-12-28 ENCOUNTER — Other Ambulatory Visit: Payer: Medicare Other

## 2017-12-28 DIAGNOSIS — C61 Malignant neoplasm of prostate: Secondary | ICD-10-CM

## 2017-12-29 LAB — PSA: Prostate Specific Ag, Serum: 0.1 ng/mL (ref 0.0–4.0)

## 2017-12-31 ENCOUNTER — Ambulatory Visit (INDEPENDENT_AMBULATORY_CARE_PROVIDER_SITE_OTHER): Payer: Medicare Other | Admitting: Urology

## 2017-12-31 ENCOUNTER — Encounter: Payer: Self-pay | Admitting: Urology

## 2017-12-31 VITALS — BP 129/74 | HR 71 | Ht 74.0 in | Wt 269.0 lb

## 2017-12-31 DIAGNOSIS — C61 Malignant neoplasm of prostate: Secondary | ICD-10-CM | POA: Diagnosis not present

## 2017-12-31 NOTE — Progress Notes (Signed)
12/31/2017 1:48 PM   Warren Stone, Warren Stone [id number]  Referring provider: [name], MD [address] [place], [place] [address]  Chief Complaint  Patient presents with  . Other    HPI: Dr B:   1. Prostate cancer The patient underwent IMRT which was completed in August 2016. In regards to pathology, he had Gleason 3+4 involving 6% of the tissue at the right lateral base Gleason 3+3 involving 60% of the tissue in the right lateral mid prostate for total of 2 of 12 cores. His transrectal ultrasound volume was 103 cc. His pretreatment PSA was 6.05. He is doing well after his radiation with occasional diarrhea. He notes no other problems from the radiation therapy at this time. His PSA was 0.17 in January 2017. It was 0.16 in July 2017. It was 0.1 in January 2018.  Today Frequency is stable PSA December 28, 2017 was 0.1 and PSA in July 2018 was 0.08 Nocturia x1.  Flow is good  Flat smooth prostatic bed with no local recurrence     PMH: Past Medical History:  Diagnosis Date  . Arteriosclerosis of coronary artery 10/29/2015   Overview:  with mi   . Arthritis   . BPH (benign prostatic hyperplasia)   . Diabetes mellitus without complication ([place])   . Gout   . Heart attack ([place]) 1996  . Hypertension   . Prostate cancer ([place])   . Stroke [facility])     Surgical History: Past Surgical History:  Procedure Laterality Date  . CHOLECYSTECTOMY    . RIGHT HEART CATHETERIZATION WITH ADENOSINE STUDY  1996    Home Medications:  Allergies as of 12/31/2017      Reactions   Pravastatin Nausea And Vomiting   Other reaction(s): Unknown   Statins Rash      Medication List        Accurate as of 12/31/17  1:48 PM. Always use your most recent med list.          amLODipine 5 MG tablet Commonly known as:  NORVASC Take 5 mg by mouth daily.   aspirin 325 MG tablet Take 325 mg by mouth daily.   atorvastatin 20 MG tablet Commonly known as:  LIPITOR Take 30 mg  by mouth daily.   cloNIDine 0.1 MG tablet Commonly known as:  CATAPRES Take 0.1 mg by mouth 2 (two) times daily.   colchicine 0.6 MG tablet Take 0.6 mg by mouth as needed. Reported on 11/25/2015   cyclobenzaprine 10 MG tablet Commonly known as:  FLEXERIL Take 10 mg by mouth 3 (three) times daily as needed.   fenofibrate micronized 134 MG capsule Commonly known as:  LOFIBRA   finasteride 5 MG tablet Commonly known as:  PROSCAR TAKE 1 TABLET BY MOUTH ONCE DAILY   glipiZIDE 5 MG 24 hr tablet Commonly known as:  GLUCOTROL XL Take 5 mg by mouth daily with breakfast.   hydrochlorothiazide Stone MG tablet Commonly known as:  HYDRODIURIL Take Stone mg by mouth daily.   IRON SUPPLEMENT 325 (65 FE) MG tablet Generic drug:  ferrous sulfate Take 325 mg by mouth daily with breakfast.   meclizine Stone MG tablet Commonly known as:  ANTIVERT 12.5 mg.   metFORMIN 500 MG 24 hr tablet Commonly known as:  GLUCOPHAGE-XR Take 1,000 mg by mouth 2 (two) times daily.   metoprolol 200 MG 24 hr tablet Commonly known as:  TOPROL-XL Take 200 mg by mouth daily.   potassium chloride SA 20 MEQ tablet  Commonly known as:  K-DUR,KLOR-CON Take 20 mEq by mouth daily.   ramipril 10 MG capsule Commonly known as:  ALTACE Take 10 mg by mouth daily.   rOPINIRole 0.Stone MG tablet Commonly known as:  REQUIP Take 0.5 mg by mouth at bedtime.   tamsulosin 0.4 MG Caps capsule Commonly known as:  FLOMAX Take 1 capsule (0.4 mg total) by mouth daily.   vitamin B-12 100 MCG tablet Commonly known as:  CYANOCOBALAMIN Take 100 mcg by mouth daily.   vitamin C 1000 MG tablet Take 1,000 mg by mouth daily.   Vitamin D3 1000 units Caps Take 1 capsule by mouth daily.       Allergies:  Allergies  Allergen Reactions  . Pravastatin Nausea And Vomiting    Other reaction(s): Unknown  . Statins Rash    Family History: History reviewed. No pertinent family history.  Social History:  reports that he quit  smoking about 22 years ago. he has never used smokeless tobacco. He reports that he does not drink alcohol or use drugs.  ROS: UROLOGY Frequent Urination?: No Hard to postpone urination?: No Burning/pain with urination?: No Get up at night to urinate?: No Leakage of urine?: No Urine stream starts and stops?: No Trouble starting stream?: No Do you have to strain to urinate?: No Blood in urine?: No Urinary tract infection?: No Sexually transmitted disease?: No Injury to kidneys or bladder?: No Painful intercourse?: No Weak stream?: No Erection problems?: Yes Penile pain?: No  Gastrointestinal Nausea?: No Vomiting?: No Indigestion/heartburn?: Yes Diarrhea?: No Constipation?: No  Constitutional Fever: No Night sweats?: No Weight loss?: No Fatigue?: Yes  Skin Skin rash/lesions?: No Itching?: No  Eyes Blurred vision?: No Double vision?: No  Ears/Nose/Throat Sore throat?: No Sinus problems?: No  Hematologic/Lymphatic Swollen glands?: No Easy bruising?: No  Cardiovascular Leg swelling?: No Chest pain?: No  Respiratory Cough?: No Shortness of breath?: No  Endocrine Excessive thirst?: No  Musculoskeletal Back pain?: Yes Joint pain?: Yes  Neurological Headaches?: No Dizziness?: No  Psychologic Depression?: No Anxiety?: No  Physical Exam: BP 129/74   Pulse 71   Ht 6\' 2"  (1.88 m)   Wt 269 lb (122 kg)   BMI 34.54 kg/m   Constitutional:  Alert and oriented, No acute distress.  Laboratory Data: Lab Results  Component Value Date   WBC 8.6 06/24/2015   HGB 14.0 06/24/2015   HCT 41.6 06/24/2015   MCV 78.2 (L) 06/24/2015   PLT 129 (L) 06/24/2015    Lab Results  Component Value Date   CREATININE 1.15 03/14/2014    Lab Results  Component Value Date   PSA 0.16 06/29/2016   PSA 0.17 12/30/2015    No results found for: TESTOSTERONE  No results found for: HGBA1C  Urinalysis    Component Value Date/Time   APPEARANCEUR Clear 11/25/2015  1043   GLUCOSEU Negative 11/25/2015 1043   BILIRUBINUR Negative 11/25/2015 1043   PROTEINUR Negative 11/25/2015 1043   NITRITE Negative 11/25/2015 1043   LEUKOCYTESUR Negative 11/25/2015 1043    Pertinent Imaging: None  Assessment & Plan: Reassess with PSA in 1 year  There are no diagnoses linked to this encounter.  No Follow-up on file.  Reece Packer, MD  [facility] [address] [place], [place] [address] [phone]

## 2018-01-21 ENCOUNTER — Telehealth: Payer: Self-pay | Admitting: Urology

## 2018-01-21 DIAGNOSIS — N400 Enlarged prostate without lower urinary tract symptoms: Secondary | ICD-10-CM

## 2018-01-21 MED ORDER — FINASTERIDE 5 MG PO TABS: 5.0000 mg | ORAL_TABLET | Freq: Every day | ORAL | 11 refills | Status: DC

## 2018-01-21 NOTE — Telephone Encounter (Signed)
Patient requesting a refill of finasteride.  He is currently out of medication.  Please contact him at [phone].

## 2018-01-21 NOTE — Telephone Encounter (Signed)
Medication refilled

## 2018-04-29 ENCOUNTER — Other Ambulatory Visit: Payer: Self-pay

## 2018-04-29 DIAGNOSIS — K5900 Constipation, unspecified: Secondary | ICD-10-CM | POA: Diagnosis not present

## 2018-04-29 DIAGNOSIS — N400 Enlarged prostate without lower urinary tract symptoms: Secondary | ICD-10-CM | POA: Diagnosis not present

## 2018-04-29 DIAGNOSIS — W881XXA Exposure to radioactive isotopes, initial encounter: Secondary | ICD-10-CM | POA: Diagnosis present

## 2018-04-29 DIAGNOSIS — K5730 Diverticulosis of large intestine without perforation or abscess without bleeding: Secondary | ICD-10-CM | POA: Diagnosis not present

## 2018-04-29 DIAGNOSIS — Z888 Allergy status to other drugs, medicaments and biological substances status: Secondary | ICD-10-CM

## 2018-04-29 DIAGNOSIS — K648 Other hemorrhoids: Secondary | ICD-10-CM | POA: Diagnosis present

## 2018-04-29 DIAGNOSIS — Z955 Presence of coronary angioplasty implant and graft: Secondary | ICD-10-CM

## 2018-04-29 DIAGNOSIS — I2510 Atherosclerotic heart disease of native coronary artery without angina pectoris: Secondary | ICD-10-CM | POA: Diagnosis not present

## 2018-04-29 DIAGNOSIS — M1990 Unspecified osteoarthritis, unspecified site: Secondary | ICD-10-CM | POA: Diagnosis present

## 2018-04-29 DIAGNOSIS — Z79899 Other long term (current) drug therapy: Secondary | ICD-10-CM

## 2018-04-29 DIAGNOSIS — Q402 Other specified congenital malformations of stomach: Secondary | ICD-10-CM | POA: Diagnosis not present

## 2018-04-29 DIAGNOSIS — K627 Radiation proctitis: Secondary | ICD-10-CM | POA: Diagnosis not present

## 2018-04-29 DIAGNOSIS — K921 Melena: Secondary | ICD-10-CM | POA: Diagnosis present

## 2018-04-29 DIAGNOSIS — I252 Old myocardial infarction: Secondary | ICD-10-CM

## 2018-04-29 DIAGNOSIS — E119 Type 2 diabetes mellitus without complications: Secondary | ICD-10-CM | POA: Diagnosis present

## 2018-04-29 DIAGNOSIS — E785 Hyperlipidemia, unspecified: Secondary | ICD-10-CM | POA: Diagnosis not present

## 2018-04-29 DIAGNOSIS — K228 Other specified diseases of esophagus: Secondary | ICD-10-CM | POA: Diagnosis present

## 2018-04-29 DIAGNOSIS — Z8546 Personal history of malignant neoplasm of prostate: Secondary | ICD-10-CM

## 2018-04-29 DIAGNOSIS — Z8673 Personal history of transient ischemic attack (TIA), and cerebral infarction without residual deficits: Secondary | ICD-10-CM

## 2018-04-29 DIAGNOSIS — K621 Rectal polyp: Secondary | ICD-10-CM | POA: Diagnosis not present

## 2018-04-29 DIAGNOSIS — Z7982 Long term (current) use of aspirin: Secondary | ICD-10-CM | POA: Diagnosis not present

## 2018-04-29 DIAGNOSIS — N179 Acute kidney failure, unspecified: Secondary | ICD-10-CM | POA: Diagnosis present

## 2018-04-29 DIAGNOSIS — I10 Essential (primary) hypertension: Secondary | ICD-10-CM | POA: Diagnosis not present

## 2018-04-29 LAB — COMPREHENSIVE METABOLIC PANEL
ALT: 25 U/L (ref 17–63)
AST: 29 U/L (ref 15–41)
Albumin: 4.2 g/dL (ref 3.5–5.0)
Alkaline Phosphatase: 54 U/L (ref 38–126)
Anion gap: 9 (ref 5–15)
BILIRUBIN TOTAL: 0.7 mg/dL (ref 0.3–1.2)
BUN: 23 mg/dL — ABNORMAL HIGH (ref 6–20)
CHLORIDE: 99 mmol/L — AB (ref 101–111)
CO2: 28 mmol/L (ref 22–32)
CREATININE: 1.35 mg/dL — AB (ref 0.61–1.24)
Calcium: 9.2 mg/dL (ref 8.9–10.3)
GFR calc Af Amer: >60 mL/min (ref >60)
GFR, EST NON AFRICAN AMERICAN: 53 mL/min — AB (ref >60)
Glucose, Bld: 203 mg/dL — ABNORMAL HIGH (ref 65–99)
Potassium: 3.9 mmol/L (ref 3.5–5.1)
Sodium: 136 mmol/L (ref 135–145)
TOTAL PROTEIN: 7.8 g/dL (ref 6.5–8.1)

## 2018-04-29 LAB — URINALYSIS, COMPLETE (UACMP) WITH MICROSCOPIC
Bilirubin Urine: NEGATIVE
Glucose, UA: 50 mg/dL — AB
Hgb urine dipstick: NEGATIVE
Ketones, ur: NEGATIVE mg/dL
Leukocytes, UA: NEGATIVE
NITRITE: NEGATIVE
PROTEIN: NEGATIVE mg/dL
SPECIFIC GRAVITY, URINE: 1.012 (ref 1.005–1.030)
Squamous Epithelial / LPF: NONE SEEN (ref 0–5)
WBC, UA: NONE SEEN WBC/hpf (ref 0–5)
pH: 5 (ref 5.0–8.0)

## 2018-04-29 LAB — CBC
HCT: 43 % (ref 40.0–52.0)
Hemoglobin: 14.9 g/dL (ref 13.0–18.0)
MCH: 28 pg (ref 26.0–34.0)
MCHC: 34.6 g/dL (ref 32.0–36.0)
MCV: 80.9 fL (ref 80.0–100.0)
PLATELETS: 165 10*3/uL (ref 150–440)
RBC: 5.32 MIL/uL (ref 4.40–5.90)
RDW: 14.6 % — ABNORMAL HIGH (ref 11.5–14.5)
WBC: 10.3 10*3/uL (ref 3.8–10.6)

## 2018-04-29 NOTE — ED Triage Notes (Signed)
Pt states he noted rectal bleeding today states not when he had BM but noted it on jeans and chair where he was sitting. States does have a hx of hemorrhoids and prostate CA, never had rectal bleeding in the past.

## 2018-04-30 ENCOUNTER — Inpatient Hospital Stay
Admission: EM | Admit: 2018-04-30 | Discharge: 2018-05-01 | DRG: 394 | Disposition: A | Discharge status: Home / Self Care | Payer: Medicare Other | Attending: Internal Medicine | Admitting: Internal Medicine

## 2018-04-30 ENCOUNTER — Other Ambulatory Visit: Payer: Self-pay

## 2018-04-30 DIAGNOSIS — Q402 Other specified congenital malformations of stomach: Secondary | ICD-10-CM

## 2018-04-30 DIAGNOSIS — K5900 Constipation, unspecified: Secondary | ICD-10-CM | POA: Diagnosis not present

## 2018-04-30 DIAGNOSIS — K627 Radiation proctitis: Secondary | ICD-10-CM

## 2018-04-30 DIAGNOSIS — R718 Other abnormality of red blood cells: Secondary | ICD-10-CM

## 2018-04-30 DIAGNOSIS — K621 Rectal polyp: Secondary | ICD-10-CM

## 2018-04-30 DIAGNOSIS — I252 Old myocardial infarction: Secondary | ICD-10-CM | POA: Diagnosis not present

## 2018-04-30 DIAGNOSIS — W881XXA Exposure to radioactive isotopes, initial encounter: Secondary | ICD-10-CM | POA: Diagnosis not present

## 2018-04-30 DIAGNOSIS — K228 Other specified diseases of esophagus: Secondary | ICD-10-CM

## 2018-04-30 DIAGNOSIS — Z8673 Personal history of transient ischemic attack (TIA), and cerebral infarction without residual deficits: Secondary | ICD-10-CM | POA: Diagnosis not present

## 2018-04-30 DIAGNOSIS — K648 Other hemorrhoids: Secondary | ICD-10-CM | POA: Diagnosis not present

## 2018-04-30 DIAGNOSIS — K2281 Esophageal polyp: Secondary | ICD-10-CM

## 2018-04-30 DIAGNOSIS — K921 Melena: Secondary | ICD-10-CM | POA: Diagnosis present

## 2018-04-30 DIAGNOSIS — M1990 Unspecified osteoarthritis, unspecified site: Secondary | ICD-10-CM | POA: Diagnosis not present

## 2018-04-30 DIAGNOSIS — I2510 Atherosclerotic heart disease of native coronary artery without angina pectoris: Secondary | ICD-10-CM | POA: Diagnosis not present

## 2018-04-30 DIAGNOSIS — N400 Enlarged prostate without lower urinary tract symptoms: Secondary | ICD-10-CM | POA: Diagnosis not present

## 2018-04-30 DIAGNOSIS — N179 Acute kidney failure, unspecified: Secondary | ICD-10-CM | POA: Diagnosis not present

## 2018-04-30 DIAGNOSIS — K5730 Diverticulosis of large intestine without perforation or abscess without bleeding: Secondary | ICD-10-CM | POA: Diagnosis not present

## 2018-04-30 DIAGNOSIS — E119 Type 2 diabetes mellitus without complications: Secondary | ICD-10-CM | POA: Diagnosis not present

## 2018-04-30 DIAGNOSIS — I10 Essential (primary) hypertension: Secondary | ICD-10-CM | POA: Diagnosis not present

## 2018-04-30 DIAGNOSIS — K625 Hemorrhage of anus and rectum: Secondary | ICD-10-CM

## 2018-04-30 DIAGNOSIS — Z888 Allergy status to other drugs, medicaments and biological substances status: Secondary | ICD-10-CM | POA: Diagnosis not present

## 2018-04-30 DIAGNOSIS — E785 Hyperlipidemia, unspecified: Secondary | ICD-10-CM | POA: Diagnosis not present

## 2018-04-30 DIAGNOSIS — Z8546 Personal history of malignant neoplasm of prostate: Secondary | ICD-10-CM | POA: Diagnosis not present

## 2018-04-30 LAB — GLUCOSE, CAPILLARY
Glucose-Capillary: 131 mg/dL — ABNORMAL HIGH (ref 65–99)
Glucose-Capillary: 140 mg/dL — ABNORMAL HIGH (ref 65–99)
Glucose-Capillary: 169 mg/dL — ABNORMAL HIGH (ref 65–99)
Glucose-Capillary: 125 mg/dL — ABNORMAL HIGH (ref 65–99)
Glucose-Capillary: 127 mg/dL — ABNORMAL HIGH (ref 65–99)

## 2018-04-30 LAB — TYPE AND SCREEN
ABO/RH(D): A POS
Antibody Screen: NEGATIVE

## 2018-04-30 LAB — HEMOGLOBIN A1C
Hgb A1c MFr Bld: 7.4 % — ABNORMAL HIGH (ref 4.8–5.6)
Mean Plasma Glucose: 165.68 mg/dL

## 2018-04-30 LAB — TSH: TSH: 1.313 u[IU]/mL (ref 0.350–4.500)

## 2018-04-30 MED ORDER — ONDANSETRON HCL 4 MG PO TABS: 4.0000 mg | ORAL_TABLET | Freq: Four times a day (QID) | ORAL | Status: DC | PRN

## 2018-04-30 MED ORDER — ACETAMINOPHEN 650 MG RE SUPP: 650.0000 mg | Freq: Four times a day (QID) | RECTAL | Status: DC | PRN

## 2018-04-30 MED ORDER — SODIUM CHLORIDE 0.9 % IV SOLN
INTRAVENOUS | Status: DC
  Administered 2018-04-30 – 2018-05-01 (×5): via INTRAVENOUS

## 2018-04-30 MED ORDER — VITAMIN C 500 MG PO TABS
1000.0000 mg | ORAL_TABLET | Freq: Every day | ORAL | Status: DC
  Administered 2018-04-30: 09:00:00 1000 mg via ORAL
  Filled 2018-04-30 (×2): qty 2

## 2018-04-30 MED ORDER — CLONIDINE HCL 0.1 MG PO TABS
0.1000 mg | ORAL_TABLET | Freq: Two times a day (BID) | ORAL | Status: DC
  Administered 2018-04-30 (×2): 0.1 mg via ORAL
  Filled 2018-04-30 (×2): qty 1

## 2018-04-30 MED ORDER — ACETAMINOPHEN 325 MG PO TABS: 650.0000 mg | ORAL_TABLET | Freq: Four times a day (QID) | ORAL | Status: DC | PRN

## 2018-04-30 MED ORDER — DOCUSATE SODIUM 100 MG PO CAPS
100.0000 mg | ORAL_CAPSULE | Freq: Two times a day (BID) | ORAL | Status: DC
  Administered 2018-04-30 (×2): 100 mg via ORAL
  Filled 2018-04-30 (×2): qty 1

## 2018-04-30 MED ORDER — PEG 3350-KCL-NA BICARB-NACL 420 G PO SOLR
4000.0000 mL | Freq: Once | ORAL | Status: AC
  Administered 2018-04-30: 4000 mL via ORAL
  Filled 2018-04-30 (×2): qty 4000

## 2018-04-30 MED ORDER — ONDANSETRON HCL 4 MG/2ML IJ SOLN: 4.0000 mg | Freq: Four times a day (QID) | INTRAMUSCULAR | Status: DC | PRN

## 2018-04-30 MED ORDER — COLCHICINE 0.6 MG PO TABS
0.6000 mg | ORAL_TABLET | Freq: Every day | ORAL | Status: DC | PRN
  Filled 2018-04-30: qty 1

## 2018-04-30 MED ORDER — FENOFIBRATE 160 MG PO TABS
160.0000 mg | ORAL_TABLET | Freq: Every day | ORAL | Status: DC
  Administered 2018-04-30 – 2018-05-01 (×2): 160 mg via ORAL
  Filled 2018-04-30 (×2): qty 1

## 2018-04-30 MED ORDER — BISACODYL 5 MG PO TBEC
10.0000 mg | DELAYED_RELEASE_TABLET | Freq: Once | ORAL | Status: AC
  Administered 2018-04-30: 18:00:00 10 mg via ORAL
  Filled 2018-04-30: qty 2

## 2018-04-30 MED ORDER — PANTOPRAZOLE SODIUM 40 MG IV SOLR
40.0000 mg | Freq: Once | INTRAVENOUS | Status: AC
  Administered 2018-04-30: 40 mg via INTRAVENOUS
  Filled 2018-04-30: qty 40

## 2018-04-30 MED ORDER — VITAMIN D 1000 UNITS PO TABS
1000.0000 [IU] | ORAL_TABLET | Freq: Every day | ORAL | Status: DC
  Administered 2018-04-30: 1000 [IU] via ORAL
  Filled 2018-04-30: qty 1

## 2018-04-30 MED ORDER — SODIUM CHLORIDE 0.9 % IV BOLUS
1000.0000 mL | Freq: Once | INTRAVENOUS | Status: AC
  Administered 2018-04-30: 1000 mL via INTRAVENOUS

## 2018-04-30 MED ORDER — SODIUM CHLORIDE 0.9 % IV SOLN
8.0000 mg/h | INTRAVENOUS | Status: DC
  Administered 2018-04-30 – 2018-05-01 (×3): 8 mg/h via INTRAVENOUS
  Filled 2018-04-30 (×2): qty 80
  Filled 2018-04-30: qty 40

## 2018-04-30 MED ORDER — METOPROLOL SUCCINATE ER 50 MG PO TB24
200.0000 mg | ORAL_TABLET | Freq: Every day | ORAL | Status: DC
  Administered 2018-04-30 – 2018-05-01 (×2): 200 mg via ORAL
  Filled 2018-04-30 (×3): qty 4

## 2018-04-30 MED ORDER — POTASSIUM CHLORIDE CRYS ER 20 MEQ PO TBCR
20.0000 meq | EXTENDED_RELEASE_TABLET | Freq: Every day | ORAL | Status: DC
Start: 2018-04-30 — End: 2018-05-01
  Administered 2018-04-30: 09:00:00 20 meq via ORAL
  Filled 2018-04-30: qty 1

## 2018-04-30 MED ORDER — AMLODIPINE BESYLATE 5 MG PO TABS
5.0000 mg | ORAL_TABLET | Freq: Every day | ORAL | Status: DC
  Administered 2018-04-30 – 2018-05-01 (×2): 5 mg via ORAL
  Filled 2018-04-30 (×2): qty 1

## 2018-04-30 MED ORDER — INSULIN ASPART 100 UNIT/ML ~~LOC~~ SOLN
0.0000 [IU] | SUBCUTANEOUS | Status: DC
  Administered 2018-04-30 (×2): 1 [IU] via SUBCUTANEOUS
  Administered 2018-04-30: 12:00:00 2 [IU] via SUBCUTANEOUS
  Administered 2018-04-30 – 2018-05-01 (×4): 1 [IU] via SUBCUTANEOUS
  Filled 2018-04-30 (×6): qty 1

## 2018-04-30 MED ORDER — VITAMIN B-12 100 MCG PO TABS
100.0000 ug | ORAL_TABLET | Freq: Every day | ORAL | Status: DC
  Administered 2018-04-30: 100 ug via ORAL
  Filled 2018-04-30 (×2): qty 1

## 2018-04-30 MED ORDER — ROPINIROLE HCL 1 MG PO TABS
0.5000 mg | ORAL_TABLET | Freq: Every day | ORAL | Status: DC
  Administered 2018-04-30: 21:00:00 0.5 mg via ORAL
  Filled 2018-04-30: qty 1

## 2018-04-30 MED ORDER — HYDROCHLOROTHIAZIDE 25 MG PO TABS
25.0000 mg | ORAL_TABLET | Freq: Every day | ORAL | Status: DC
  Administered 2018-04-30: 09:00:00 25 mg via ORAL
  Filled 2018-04-30: qty 1

## 2018-04-30 MED ORDER — ATORVASTATIN CALCIUM 20 MG PO TABS
30.0000 mg | ORAL_TABLET | Freq: Every day | ORAL | Status: DC
  Administered 2018-04-30 – 2018-05-01 (×2): 30 mg via ORAL
  Filled 2018-04-30 (×2): qty 2

## 2018-04-30 MED ORDER — INSULIN ASPART 100 UNIT/ML ~~LOC~~ SOLN
0.0000 [IU] | Freq: Every day | SUBCUTANEOUS | Status: DC
  Filled 2018-04-30: qty 1

## 2018-04-30 MED ORDER — FERROUS SULFATE 325 (65 FE) MG PO TABS
325.0000 mg | ORAL_TABLET | Freq: Every day | ORAL | Status: DC
  Administered 2018-04-30: 325 mg via ORAL
  Filled 2018-04-30: qty 1

## 2018-04-30 NOTE — ED Provider Notes (Signed)
[facility] Emergency Department Provider Note   ____________________________________________   First MD Initiated Contact with Patient 04/30/18 [phone]     (approximate)  I have reviewed the triage vital signs and the nursing notes.   HISTORY  Chief Complaint Rectal Bleeding    HPI Warren Thelin Southwood Sr. is a 67 y.o. male who presents to the ED from home with a chief complaint of rectal bleeding.  Patient reports 2 to 3 weeks ago he had a bowel movement followed by some bright red blood per rectum.  Symptoms resolved.  Tonight he was eating a sandwich in the kitchen when he saw blood clots coming out of his rectum through his underwear and staining his jeans.  Complains of some low back pain.  Has noted some black and tarry stools this week.  Has never had a colonoscopy or endoscopy.  Denies use of anticoagulants other than baby aspirin.  States his PCP recently decreased him from full strength to baby aspirin.  Other than that, patient denies taking NSAIDs or Goody powders.  Denies EtOH.  Denies recent fever, chills, chest pain, shortness of breath, abdominal pain, nausea, vomiting.  Denies recent travel trauma.   Past Medical History:  Diagnosis Date  . Arteriosclerosis of coronary artery 10/29/2015   Overview:  with mi   . Arthritis   . BPH (benign prostatic hyperplasia)   . Diabetes mellitus without complication ([place])   . Gout   . Heart attack ([place]) 1996  . Hypertension   . Prostate cancer ([place])   . Stroke [facility])     Patient Active Problem List   Diagnosis Date Noted  . Arteriosclerosis of coronary artery 10/29/2015  . Diabetes mellitus, type 2 ([place]) 10/29/2015  . Calculus of kidney 10/29/2015  . Type 2 diabetes mellitus ([place]) 10/29/2015  . Acute gouty arthritis 04/08/2015  . H/O renal calculi 04/08/2015  . CA of prostate ([place]) 04/01/2015  . Malignant neoplasm of prostate ([place]) 04/01/2015  . Essential (primary) hypertension 09/30/2014  .  Abnormal prostate specific antigen 09/10/2014  . Episodic tension type headache 09/10/2014  . Adiposity 09/10/2014  . Bilateral tinnitus 09/10/2014  . Elevated prostate specific antigen (PSA) 09/10/2014  . 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 Cephalalgia 05/06/2014  . Buzzing in ear 05/06/2014  . 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 Arthropathia 04/04/2013  . Back ache 04/04/2013  . Hypercholesterolemia without hypertriglyceridemia 02/13/2012  . Pure hypercholesterolemia 02/13/2012    Past Surgical History:  Procedure Laterality Date  . CHOLECYSTECTOMY    . RIGHT HEART CATHETERIZATION WITH ADENOSINE STUDY  1996    Prior to Admission medications   Medication Sig Start Date End Date Taking? Authorizing Provider  amLODipine (NORVASC) 5 MG tablet Take 5 mg by mouth daily.  04/11/15   [provider]  Ascorbic Acid (VITAMIN C) 1000 MG tablet Take 1,000 mg by mouth daily.    [provider]  aspirin 325 MG tablet Take 325 mg by mouth daily.  05/19/09   [provider]  atorvastatin (LIPITOR) 20 MG tablet Take 30 mg by mouth daily. 11/02/15   [provider]  Cholecalciferol (VITAMIN D3) 1000 UNITS CAPS Take 1 capsule by mouth daily.    [provider]  cloNIDine (CATAPRES) 0.1 MG tablet Take 0.1 mg by mouth 2 (two) times daily.  03/30/15   [provider]  colchicine 0.6  MG tablet Take 0.6 mg by mouth as needed. Reported on 11/25/2015 04/08/15   [provider]  cyclobenzaprine (FLEXERIL) 10 MG tablet Take 10 mg by mouth 3 (three) times daily as needed.  09/09/10   [provider]  fenofibrate micronized (LOFIBRA) 134 MG capsule  07/18/17   [provider]  ferrous sulfate (IRON SUPPLEMENT) 325 (65 FE) MG tablet Take 325 mg by mouth daily with breakfast.    [provider]    finasteride (PROSCAR) 5 MG tablet Take 1 tablet (5 mg total) by mouth daily. 01/21/18   Bjorn Loser, MD  glipiZIDE (GLUCOTROL XL) 5 MG 24 hr tablet Take 5 mg by mouth daily with breakfast.  03/22/15   [provider]  hydrochlorothiazide (HYDRODIURIL) 25 MG tablet Take 25 mg by mouth daily.  04/11/15   [provider]  meclizine (ANTIVERT) 25 MG tablet 12.5 mg.  09/23/15   [provider]  metFORMIN (GLUCOPHAGE-XR) 500 MG 24 hr tablet Take 1,000 mg by mouth 2 (two) times daily.  05/14/16   [provider]  metoprolol (TOPROL-XL) 200 MG 24 hr tablet Take 200 mg by mouth daily.  04/11/15   [provider]  potassium chloride SA (K-DUR,KLOR-CON) 20 MEQ tablet Take 20 mEq by mouth daily.  04/11/15   [provider]  ramipril (ALTACE) 10 MG capsule Take 10 mg by mouth daily.  03/30/15   [provider]  rOPINIRole (REQUIP) 0.25 MG tablet Take 0.5 mg by mouth at bedtime.  04/18/15   [provider]  tamsulosin (FLOMAX) 0.4 MG CAPS capsule Take 1 capsule (0.4 mg total) by mouth daily. Patient not taking: Reported on 12/31/2017 03/28/17   Nickie Retort, MD  vitamin B-12 (CYANOCOBALAMIN) 100 MCG tablet Take 100 mcg by mouth daily.    [provider]    Allergies Pravastatin and Statins  No family history on file.  Social History Social History   Tobacco Use  . Smoking status: Former Smoker    Last attempt to quit: 09/15/1995    Years since quitting: 22.6  . Smokeless tobacco: Never Used  Substance Use Topics  . Alcohol use: No    Alcohol/week: 0.0 oz  . Drug use: No    Review of Systems  Constitutional: No fever/chills. Eyes: No visual changes. ENT: No sore throat. Cardiovascular: Denies chest pain. Respiratory: Denies shortness of breath. Gastrointestinal: Positive for rectal bleeding.  No abdominal pain.  No nausea, no vomiting.  No diarrhea.  No constipation. Genitourinary: Negative for  dysuria. Musculoskeletal: Negative for back pain. Skin: Negative for rash. Neurological: Negative for headaches, focal weakness or numbness.   ____________________________________________   PHYSICAL EXAM:  VITAL SIGNS: ED Triage Vitals  Enc Vitals Group     BP 04/29/18 1921 138/75     Pulse Rate 04/29/18 1921 95     Resp 04/29/18 1921 18     Temp 04/29/18 1921 98.7 F (37.1 C)     Temp Source 04/29/18 1921 Oral     SpO2 04/29/18 1921 97 %     Weight 04/29/18 1923 267 lb (121.1 kg)     Height 04/29/18 1923 6\' 2"  (1.88 m)     Head Circumference --      Peak Flow --      Pain Score 04/29/18 1929 5     Pain Loc --      Pain Edu? --      Excl. in [place]? --     Constitutional: Alert  and oriented. Well appearing and in no acute distress. Eyes: Conjunctivae are normal. PERRL. EOMI. Head: Atraumatic. Nose: No congestion/rhinnorhea. Mouth/Throat: Mucous membranes are moist.  Oropharynx non-erythematous. Neck: No stridor.   Cardiovascular: Normal rate, regular rhythm. Grossly normal heart sounds.  Good peripheral circulation. Respiratory: Normal respiratory effort.  No retractions. Lungs CTAB. Gastrointestinal: Soft and nontender to light and deep palpation. No distention. No abdominal bruits. No CVA tenderness.  Reducible umbilical hernia. Musculoskeletal: No lower extremity tenderness nor edema.  No joint effusions. Neurologic:  Normal speech and language. No gross focal neurologic deficits are appreciated. No gait instability. Skin:  Skin is warm, dry and intact. No rash noted. Psychiatric: Mood and affect are normal. Speech and behavior are normal.  ____________________________________________   LABS (all labs ordered are listed, but only abnormal results are displayed)  Labs Reviewed  CBC - Abnormal; Notable for the following components:      Result Value   RDW 14.6 (*)    All other components within normal limits  COMPREHENSIVE METABOLIC PANEL - Abnormal; Notable for  the following components:   Chloride 99 (*)    Glucose, Bld 203 (*)    BUN 23 (*)    Creatinine, Ser 1.35 (*)    GFR calc non Af Amer 53 (*)    All other components within normal limits  URINALYSIS, COMPLETE (UACMP) WITH MICROSCOPIC - Abnormal; Notable for the following components:   Color, Urine STRAW (*)    APPearance CLEAR (*)    Glucose, UA 50 (*)    Bacteria, UA RARE (*)    All other components within normal limits   ____________________________________________  EKG  None ____________________________________________  RADIOLOGY  ED MD interpretation: None  Official radiology report(s): No results found.  ____________________________________________   PROCEDURES  Procedure(s) performed: None  Procedures  Critical Care performed: No  ____________________________________________   INITIAL IMPRESSION / ASSESSMENT AND PLAN / ED COURSE  As part of my medical decision making, I reviewed the following data within the electronic MEDICAL RECORD NUMBER History obtained from family, Nursing notes reviewed and incorporated, Labs reviewed, Old chart reviewed, Discussed with admitting physician and Notes from prior ED visits   67 year old male who presents with rectal bleeding.  Hematochezia noted on exam.  Differential diagnosis includes but is not limited to upper GI bleed due to ulcer, lower GI bleed due to colitis or diverticulitis, bleeding due to hemorrhoids, etc.   Laboratory results revealed normal H/H; BUN and creatinine elevated from baseline.  Given that patient saw melanotic stools this week, will initiate IV Protonix bolus with drip.  Will type and screen in the event that patient requires blood transfusion.  Will discuss with hospitalist to evaluate patient in the emergency department for admission.      ____________________________________________   FINAL CLINICAL IMPRESSION(S) / ED DIAGNOSES  Final diagnoses:  Rectal bleeding  Hematochezia  Melena      ED Discharge Orders    None       Note:  This document was prepared using Dragon voice recognition software and may include unintentional dictation errors.    Paulette Blanch, MD 04/30/18 [phone]

## 2018-04-30 NOTE — Consult Note (Signed)
Warren Antigua, MD [address], [place], [place], [place], [address] [address], [place], [place], [place], [address] Phone: [phone]  Fax: [phone]  Consultation  Referring Provider:     Dr. Darvin Stone Primary Care Physician:  Warren Curry, MD is Reason for Consultation:    Rectal bleeding  Date of Admission:  04/30/2018 Date of Consultation:  04/30/2018         HPI:   Warren Portal Gleed Sr. is a 68 y.o. male reports seeing blood clots from his rectum, yesterday at home.  He states he saw bright red blood per rectum 2 to 3 weeks ago, that resolved spontaneously.  He then had recurrence yesterday.  Also reports some black stool intermixed with red blood clots yesterday at home as well.  No previous history of colonoscopy or upper endoscopy.  Is on aspirin at home, that was recently decreased to 81 mg dose, compared to higher dose prior to that.  No other NSAID use. No family history of colon cancer.   Previous labwork shows microcytosis dating back 2 yrs ago atleast, with MCV of 78. Hgb is normal at 14.9.  Is on iron replacement at home.  Denies any abdominal pain.  Reports mild abdominal cramping ongoing since yesterday.  Reports his discomfort is only about 2/10.  No nausea or vomiting.  Past Medical History:  Diagnosis Date  . Arteriosclerosis of coronary artery 10/29/2015   Overview:  with mi   . Arthritis   . BPH (benign prostatic hyperplasia)   . Diabetes mellitus without complication ([place])   . Gout   . Heart attack ([place]) 1996  . Hypertension   . Prostate cancer ([place])   . Stroke [facility])     Past Surgical History:  Procedure Laterality Date  . CHOLECYSTECTOMY    . RIGHT HEART CATHETERIZATION WITH ADENOSINE STUDY  1996    Prior to Admission medications   Medication Sig Start Date End Date Taking? Authorizing Provider  amLODipine (NORVASC) 5 MG tablet Take 5 mg by mouth daily.  04/11/15  Yes [provider]  Ascorbic Acid (VITAMIN C) 1000 MG  tablet Take 1,000 mg by mouth daily.   Yes [provider]  aspirin EC 81 MG tablet Take 81 mg by mouth daily.   Yes [provider]  atorvastatin (LIPITOR) 20 MG tablet Take 30 mg by mouth daily.   Yes [provider]  Cholecalciferol (VITAMIN D3) 1000 UNITS CAPS Take 1 capsule by mouth daily.   Yes [provider]  cloNIDine (CATAPRES) 0.1 MG tablet Take 0.1 mg by mouth 2 (two) times daily.  03/30/15  Yes [provider]  colchicine 0.6 MG tablet Take 0.6 mg by mouth daily as needed (gout flare). Reported on 11/25/2015 04/08/15  Yes [provider]  fenofibrate 160 MG tablet Take 160 mg by mouth daily.   Yes [provider]  ferrous sulfate (IRON SUPPLEMENT) 325 (65 FE) MG tablet Take 325 mg by mouth daily with breakfast.   Yes [provider]  glipiZIDE (GLUCOTROL XL) 5 MG 24 hr tablet Take 5 mg by mouth daily with breakfast.  03/22/15  Yes [provider]  hydrochlorothiazide (HYDRODIURIL) 25 MG tablet Take 25 mg by mouth daily.  04/11/15  Yes [provider]  metFORMIN (GLUCOPHAGE-XR) 500 MG 24 hr tablet Take 1,000 mg by mouth daily with supper.  05/14/16  Yes [provider]  metoprolol (TOPROL-XL) 200 MG 24 hr tablet Take 200 mg by mouth daily.  04/11/15  Yes [provider]  Omega-3 Fatty Acids (FISH OIL) 1000 MG CAPS Take 2,000 mg by mouth daily.   Yes [provider]  potassium chloride SA (K-DUR,KLOR-CON) 20 MEQ tablet Take 20 mEq by mouth daily.  04/11/15  Yes [provider]  ramipril (ALTACE) 10 MG capsule Take 10 mg by mouth daily.  03/30/15  Yes [provider]  rOPINIRole (REQUIP) 0.25 MG tablet Take 0.5 mg by mouth at bedtime.  04/18/15  Yes [provider]  vitamin B-12 (CYANOCOBALAMIN) 100 MCG tablet Take 100 mcg by mouth daily.   Yes [provider]  finasteride (PROSCAR) 5 MG tablet Take 1 tablet (5 mg total) by mouth daily. Patient not  taking: Reported on 04/30/2018 01/21/18   Warren Loser, MD  tamsulosin (FLOMAX) 0.4 MG CAPS capsule Take 1 capsule (0.4 mg total) by mouth daily. Patient not taking: Reported on 12/31/2017 03/28/17   Warren Retort, MD    History reviewed. No pertinent family history.   Social History   Tobacco Use  . Smoking status: Former Smoker    Last attempt to quit: 09/15/1995    Years since quitting: 22.6  . Smokeless tobacco: Never Used  Substance Use Topics  . Alcohol use: No    Alcohol/week: 0.0 oz  . Drug use: No    Allergies as of 04/29/2018 - Review Complete 04/29/2018  Allergen Reaction Noted  . Pravastatin Nausea And Vomiting 08/26/2015  . Statins Rash 04/21/2015    Review of Systems:    All systems reviewed and negative except where noted in HPI.   Physical Exam:  Vital signs in last 24 hours: Vitals:   04/30/18 0130 04/30/18 0200 04/30/18 0330 04/30/18 0435  BP: 134/77 (!) 151/73 (!) 155/70 (!) 165/86  Pulse: 64 64 67 76  Resp:    15  Temp:    98.5 F (36.9 C)  TempSrc:    Oral  SpO2: 97% 96% 99% 97%  Weight:      Height:       Last BM Date: 04/29/18 General:   Pleasant, cooperative in NAD Head:  Normocephalic and atraumatic. Eyes:   No icterus.   Conjunctiva pink. PERRLA. Ears:  Normal auditory acuity. Neck:  Supple; no masses or thyroidomegaly Lungs: Respirations even and unlabored. Lungs clear to auscultation bilaterally.   No wheezes, crackles, or rhonchi.  Abdomen:  Soft, nondistended, nontender. Normal bowel sounds. No appreciable masses or hepatomegaly.  No rebound or guarding.  Neurologic:  Alert and oriented x3;  grossly normal neurologically. Skin:  Intact without significant lesions or rashes. Cervical Nodes:  No significant cervical adenopathy. Psych:  Alert and cooperative. Normal affect.  LAB RESULTS: Recent Labs    04/29/18 1930  WBC 10.3  HGB 14.9  HCT 43.0  PLT 165   BMET Recent Labs    04/29/18 1930  NA 136  K 3.9  CL 99*    CO2 28  GLUCOSE 203*  BUN 23*  CREATININE 1.35*  CALCIUM 9.2   LFT Recent Labs    04/29/18 1930  PROT 7.8  ALBUMIN 4.2  AST 29  ALT 25  ALKPHOS 54  BILITOT 0.7   PT/INR No results for input(s): LABPROT, INR in the last 72 hours.  STUDIES: No results found.    Impression / Plan:   Warren Choquette Mateo Sr. is a 67 y.o. y/o male with rectal bleeding at home with no prior history of endoscopy, and previous blood work showing microcytosis  Due  to no previous endoscopies, and acute episode of rectal bleeding, endoscopy is indicated Source of bleeding might be upper or lower Stable hemoglobin, and stable hemodynamics, More consistent with a lower GI bleed However, patient was on high-dose aspirin at home and also had melena this week, and thus gastric ulcer is also a possibility We will plan for EGD and colonoscopy tomorrow for evaluation  Obtain ferritin, iron, and anemia work-up with next labs   continue Protonix 40 mg IV twice daily  Continue serial CBCs and transfuse PRN Avoid NSAIDs Maintain 2 large-bore IV lines Clear liquid diet today N.p.o. past midnight  I have discussed alternative options, risks & benefits,  which include, but are not limited to, bleeding, infection, perforation,respiratory complication & drug reaction.  The patient agrees with this plan & written consent will be obtained.    Thank you for involving me in the care of this patient.      LOS: 0 days   Virgel Manifold, MD  04/30/2018, 9:37 AM

## 2018-04-30 NOTE — H&P (Signed)
Warren Kiang Phimmasone Sr. is an 67 y.o. male.   Chief Complaint: Rectal bleeding HPI: The patient with past medical history of CAD status post MI and stent placement, hypertension, stroke and BPH presents the emergency department with rectal bleeding.  The patient states that he noticed bright red blood had soaked through his pants and onto a chair.  When the patient went to clean himself he also noticed clotted tarry looking blood on his toilet tissue.  The patient had an episode of bright red blood a few weeks before but thought that he had strained himself while working that made his hemorrhoids bleed.  The patient denies any pain with his bleeding.  He also denies nausea, vomiting or diarrhea.  The patient admits to severe constipation however.  He has not had a colonoscopy.  Due to his rectal bleeding the emergency department staff called the hospitalist service for admission.  Past Medical History:  Diagnosis Date  . Arteriosclerosis of coronary artery 10/29/2015   Overview:  with mi   . Arthritis   . BPH (benign prostatic hyperplasia)   . Diabetes mellitus without complication ([place])   . Gout   . Heart attack ([place]) 1996  . Hypertension   . Prostate cancer ([place])   . Stroke [facility])     Past Surgical History:  Procedure Laterality Date  . CHOLECYSTECTOMY    . RIGHT HEART CATHETERIZATION WITH ADENOSINE STUDY  1996    History reviewed. No pertinent family history. Social History:  reports that he quit smoking about 22 years ago. He has never used smokeless tobacco. He reports that he does not drink alcohol or use drugs.  Allergies:  Allergies  Allergen Reactions  . Pravastatin Nausea And Vomiting    Other reaction(s): Unknown  . Statins Rash    Medications Prior to Admission  Medication Sig Dispense Refill  . amLODipine (NORVASC) 5 MG tablet Take 5 mg by mouth daily.   0  . Ascorbic Acid (VITAMIN C) 1000 MG tablet Take 1,000 mg by mouth daily.    [name] aspirin EC 81 MG tablet Take 81  mg by mouth daily.    [name] atorvastatin (LIPITOR) 20 MG tablet Take 30 mg by mouth daily.  1  . Cholecalciferol (VITAMIN D3) 1000 UNITS CAPS Take 1 capsule by mouth daily.    . cloNIDine (CATAPRES) 0.1 MG tablet Take 0.1 mg by mouth 2 (two) times daily.   0  . colchicine 0.6 MG tablet Take 0.6 mg by mouth daily as needed (gout flare). Reported on 11/25/2015  0  . fenofibrate 160 MG tablet Take 160 mg by mouth daily.    . ferrous sulfate (IRON SUPPLEMENT) 325 (65 FE) MG tablet Take 325 mg by mouth daily with breakfast.    . glipiZIDE (GLUCOTROL XL) 5 MG 24 hr tablet Take 5 mg by mouth daily with breakfast.   0  . hydrochlorothiazide (HYDRODIURIL) 25 MG tablet Take 25 mg by mouth daily.   0  . metFORMIN (GLUCOPHAGE-XR) 500 MG 24 hr tablet Take 1,000 mg by mouth daily with supper.   1  . metoprolol (TOPROL-XL) 200 MG 24 hr tablet Take 200 mg by mouth daily.   0  . Omega-3 Fatty Acids (FISH OIL) 1000 MG CAPS Take 2,000 mg by mouth daily.    . potassium chloride SA (K-DUR,KLOR-CON) 20 MEQ tablet Take 20 mEq by mouth daily.   0  . ramipril (ALTACE) 10 MG capsule Take 10 mg by mouth daily.   0  .  rOPINIRole (REQUIP) 0.25 MG tablet Take 0.5 mg by mouth at bedtime.   0  . vitamin B-12 (CYANOCOBALAMIN) 100 MCG tablet Take 100 mcg by mouth daily.    . finasteride (PROSCAR) 5 MG tablet Take 1 tablet (5 mg total) by mouth daily. (Patient not taking: Reported on 04/30/2018) 30 tablet 11  . tamsulosin (FLOMAX) 0.4 MG CAPS capsule Take 1 capsule (0.4 mg total) by mouth daily. (Patient not taking: Reported on 12/31/2017) 30 capsule 12    Results for orders placed or performed during the hospital encounter of 04/30/18 (from the past 48 hour(s))  CBC     Status: Abnormal   Collection Time: 04/29/18  7:30 PM  Result Value Ref Range   WBC 10.3 3.8 - 10.6 K/uL   RBC 5.32 4.40 - 5.90 MIL/uL   Hemoglobin 14.9 13.0 - 18.0 g/dL   HCT 43.0 40.0 - 52.0 %   MCV 80.9 80.0 - 100.0 fL   MCH 28.0 26.0 - 34.0 pg   MCHC 34.6  32.0 - 36.0 g/dL   RDW 14.6 (H) 11.5 - 14.5 %   Platelets 165 150 - 440 K/uL    Comment: Performed at [facility], [address] [place], [place] [address]  Comprehensive metabolic panel     Status: Abnormal   Collection Time: 04/29/18  7:30 PM  Result Value Ref Range   Sodium 136 135 - 145 mmol/L   Potassium 3.9 3.5 - 5.1 mmol/L   [place] 99 (L) 101 - 111 mmol/L   CO2 28 22 - 32 mmol/L   Glucose, Bld 203 (H) 65 - 99 mg/dL   BUN 23 (H) 6 - 20 mg/dL   Creatinine, Ser 1.35 (H) 0.61 - 1.24 mg/dL   Calcium 9.2 8.9 - 10.3 mg/dL   Total Protein 7.8 6.5 - 8.1 g/dL   Albumin 4.2 3.5 - 5.0 g/dL   AST 29 15 - 41 U/L   ALT 25 17 - 63 U/L   Alkaline Phosphatase 54 38 - 126 U/L   Total Bilirubin 0.7 0.3 - 1.2 mg/dL   GFR calc non Af Amer 53 (L) >60 mL/min   GFR calc Af Amer >60 >60 mL/min    Comment: (NOTE) The eGFR has been calculated using the CKD EPI equation. This calculation has not been validated in all clinical situations. eGFR's persistently <60 mL/min signify possible Chronic Kidney Disease.    Anion gap 9 5 - 15    Comment: Performed at [facility], [address] [place], [place] [address]  TSH     Status: None   Collection Time: 04/29/18  7:30 PM  Result Value Ref Range   TSH 1.313 0.350 - 4.500 uIU/mL    Comment: Performed by a 3rd Generation assay with a functional sensitivity of <=0.01 uIU/mL. Performed at [facility], [address] [place], [place] [address]   Urinalysis, Complete w Microscopic     Status: Abnormal   Collection Time: 04/29/18  7:31 PM  Result Value Ref Range   Color, Urine STRAW (A) YELLOW   APPearance CLEAR (A) CLEAR   Specific Gravity, Urine 1.012 1.005 - 1.030   pH 5.0 5.0 - 8.0   Glucose, UA 50 (A) NEGATIVE mg/dL   Hgb urine dipstick NEGATIVE NEGATIVE   Bilirubin Urine NEGATIVE NEGATIVE   Ketones, ur NEGATIVE NEGATIVE mg/dL   Protein, ur NEGATIVE NEGATIVE mg/dL   Nitrite NEGATIVE NEGATIVE   Leukocytes,  UA NEGATIVE NEGATIVE   RBC / HPF  0-5 0 - 5 RBC/hpf   WBC, UA NONE SEEN 0 - 5 WBC/hpf   Bacteria, UA RARE (A) NONE SEEN   Squamous Epithelial / LPF NONE SEEN 0 - 5    Comment: Performed at [facility], [address] [place], [place] [address]  Type and screen [place]     Status: None   Collection Time: 04/30/18  3:24 AM  Result Value Ref Range   ABO/RH(D) A POS    Antibody Screen NEG    Sample Expiration      05/03/2018 Performed at [facility], [address] [place], [place] [address]   Glucose, capillary     Status: Abnormal   Collection Time: 04/30/18  5:13 AM  Result Value Ref Range   Glucose-Capillary 131 (H) 65 - 99 mg/dL   No results found.  Review of Systems  Constitutional: Negative for chills and fever.  HENT: Negative for sore throat and tinnitus.   Eyes: Negative for blurred vision and redness.  Respiratory: Negative for cough and shortness of breath.   Cardiovascular: Negative for chest pain, palpitations, orthopnea and PND.  Gastrointestinal: Positive for blood in stool, constipation and melena. Negative for abdominal pain, diarrhea, nausea and vomiting.  Genitourinary: Negative for dysuria, frequency and urgency.  Musculoskeletal: Negative for joint pain and myalgias.  Skin: Negative for rash.       No lesions  Neurological: Negative for speech change, focal weakness and weakness.  Endo/Heme/Allergies: Does not bruise/bleed easily.       No temperature intolerance  Psychiatric/Behavioral: Negative for depression and suicidal ideas.    Blood pressure (!) 165/86, pulse 76, temperature 98.5 F (36.9 C), temperature source Oral, resp. rate 15, height '6\' 2"'  (1.88 m), weight 121.1 kg (267 lb), SpO2 97 %. Physical Exam  Vitals reviewed. Constitutional: He is oriented to person, place, and time. He appears well-developed and well-nourished. No distress.  HENT:  Head: Normocephalic and atraumatic.  Mouth/Throat:  Oropharynx is clear and moist. No oropharyngeal exudate.  Eyes: Pupils are equal, round, and reactive to light. Conjunctivae and EOM are normal. No scleral icterus.  Neck: Normal range of motion. Neck supple. No JVD present. No tracheal deviation present. No thyromegaly present.  Cardiovascular: Normal rate and regular rhythm. Exam reveals no gallop and no friction rub.  No murmur heard. Respiratory: Effort normal and breath sounds normal. No respiratory distress.  GI: Soft. Bowel sounds are normal. He exhibits no distension. There is no tenderness.  Genitourinary:  Genitourinary Comments: Deferred  Musculoskeletal: Normal range of motion. He exhibits no edema.  Lymphadenopathy:    He has no cervical adenopathy.  Neurological: He is alert and oriented to person, place, and time. No cranial nerve deficit.  Skin: Skin is warm and dry. No rash noted. No erythema.  Psychiatric: He has a normal mood and affect. His behavior is normal. Judgment and thought content normal.     Assessment/Plan This is a 67 year old male admitted for GI bleed. 1.  GI bleed: Unclear if only from hemorrhoids.  Some melena as well as bright red blood per rectum.  Patient is to remain n.p.o. tinea Protonix drip.  Consult gastroenterology. 2.  Acute kidney injury: Prerenal; hydrate with intravenous fluid.  Avoid nephrotoxic agents. 3.  Essential hypertension: Controlled; continue amlodipine, chlorothiazide, clonidine and metoprolol per home regimen.  Hold ACE inhibitor 4.  Diabetes mellitus type 2: Sliding cell insulin while hospitalized.  Hold oral hypoglycemic agents. 5.  Dyslipidemia: Continue fenofibrate 6.  DVT prophylaxis: SCDs 7.  GI prophylaxis: Pantoprazole plan the patient is a full code.  Time spent on admission orders and patient care proximately 45 minutes  Harrie Foreman, MD 04/30/2018, 6:44 AM

## 2018-04-30 NOTE — ED Notes (Addendum)
Pt states two weeks ago he had a BM and he saw some blood but it went away. Today is started back, he saw blood clots coming out of his rectal area through his underwear and pants while he was simply sitting and eating a sandwich. Pt has complaints of back pain. Pt is alert and oriented x 4. Family at bedside. Pt has Hx of prostate cancer a few years back. Pt also states he has gout pain in right foot currently and pain in lower abdominal area.

## 2018-04-30 NOTE — ED Notes (Signed)
Patient transported to117 

## 2018-05-01 ENCOUNTER — Inpatient Hospital Stay: Payer: Medicare Other | Admitting: Anesthesiology

## 2018-05-01 ENCOUNTER — Encounter: Payer: Self-pay | Admitting: *Deleted

## 2018-05-01 ENCOUNTER — Encounter
Admission: EM | Disposition: A | Discharge status: Home / Self Care | Payer: Self-pay | Source: Home / Self Care | Attending: Internal Medicine

## 2018-05-01 DIAGNOSIS — K2281 Esophageal polyp: Secondary | ICD-10-CM

## 2018-05-01 DIAGNOSIS — Q402 Other specified congenital malformations of stomach: Secondary | ICD-10-CM

## 2018-05-01 DIAGNOSIS — K627 Radiation proctitis: Secondary | ICD-10-CM

## 2018-05-01 DIAGNOSIS — K5730 Diverticulosis of large intestine without perforation or abscess without bleeding: Secondary | ICD-10-CM

## 2018-05-01 DIAGNOSIS — K921 Melena: Secondary | ICD-10-CM | POA: Diagnosis not present

## 2018-05-01 DIAGNOSIS — K648 Other hemorrhoids: Secondary | ICD-10-CM

## 2018-05-01 DIAGNOSIS — K228 Other specified diseases of esophagus: Secondary | ICD-10-CM | POA: Diagnosis not present

## 2018-05-01 DIAGNOSIS — K621 Rectal polyp: Secondary | ICD-10-CM | POA: Diagnosis not present

## 2018-05-01 HISTORY — PX: COLONOSCOPY: SHX5424

## 2018-05-01 HISTORY — PX: ESOPHAGOGASTRODUODENOSCOPY: SHX5428

## 2018-05-01 LAB — POCT CBG MONITORING
POCT GLUCOSE (MANUAL ENTRY) KUC: 162 mg/dL — AB (ref 70–99)
POCT Glucose (KUC): 143 mg/dL — AB (ref 70–99)

## 2018-05-01 LAB — TRANSFERRIN: Transferrin: 253 mg/dL (ref 180–329)

## 2018-05-01 LAB — FERRITIN: FERRITIN: 200 ng/mL (ref 24–336)

## 2018-05-01 LAB — IRON AND TIBC
IRON: 62 ug/dL (ref 45–182)
SATURATION RATIOS: 17 % — AB (ref 17.9–39.5)
TIBC: 361 ug/dL (ref 250–450)
UIBC: 299 ug/dL

## 2018-05-01 LAB — GLUCOSE, CAPILLARY
Glucose-Capillary: 121 mg/dL — ABNORMAL HIGH (ref 65–99)
Glucose-Capillary: 136 mg/dL — ABNORMAL HIGH (ref 65–99)

## 2018-05-01 LAB — HEMOGLOBIN: Hemoglobin: 13.3 g/dL (ref 13.0–18.0)

## 2018-05-01 SURGERY — EGD (ESOPHAGOGASTRODUODENOSCOPY)
Anesthesia: General | Wound class: Clean Contaminated

## 2018-05-01 MED ORDER — SODIUM CHLORIDE 0.9 % IV SOLN: INTRAVENOUS | Status: DC

## 2018-05-01 MED ORDER — MIDAZOLAM HCL 2 MG/2ML IJ SOLN
INTRAMUSCULAR | Status: DC | PRN
  Administered 2018-05-01 (×2): 0.5 mg via INTRAVENOUS
  Administered 2018-05-01: 1 mg via INTRAVENOUS

## 2018-05-01 MED ORDER — PHENYLEPHRINE HCL 10 MG/ML IJ SOLN
INTRAMUSCULAR | Status: DC | PRN
  Administered 2018-05-01 (×5): 100 ug via INTRAVENOUS

## 2018-05-01 MED ORDER — PANTOPRAZOLE SODIUM 40 MG PO TBEC: 40.0000 mg | DELAYED_RELEASE_TABLET | Freq: Every day | ORAL | 0 refills | Status: DC

## 2018-05-01 MED ORDER — PROPOFOL 10 MG/ML IV BOLUS
INTRAVENOUS | Status: DC | PRN
  Administered 2018-05-01: 80 mg via INTRAVENOUS
  Administered 2018-05-01: 20 mg via INTRAVENOUS
  Administered 2018-05-01: 40 mg via INTRAVENOUS

## 2018-05-01 MED ORDER — PANTOPRAZOLE SODIUM 40 MG PO TBEC
40.0000 mg | DELAYED_RELEASE_TABLET | Freq: Two times a day (BID) | ORAL | Status: DC
Start: 2018-05-01 — End: 2018-05-01
  Administered 2018-05-01: 40 mg via ORAL
  Filled 2018-05-01: qty 1

## 2018-05-01 MED ORDER — GLYCOPYRROLATE 0.2 MG/ML IJ SOLN
INTRAMUSCULAR | Status: DC | PRN
  Administered 2018-05-01: 0.2 mg via INTRAVENOUS

## 2018-05-01 MED ORDER — FENTANYL CITRATE (PF) 100 MCG/2ML IJ SOLN
INTRAMUSCULAR | Status: DC | PRN
  Administered 2018-05-01: 25 ug via INTRAVENOUS
  Administered 2018-05-01: 50 ug via INTRAVENOUS
  Administered 2018-05-01: 25 ug via INTRAVENOUS

## 2018-05-01 MED ORDER — MIDAZOLAM HCL 2 MG/2ML IJ SOLN
INTRAMUSCULAR | Status: AC
  Filled 2018-05-01: qty 2

## 2018-05-01 MED ORDER — PROPOFOL 500 MG/50ML IV EMUL
INTRAVENOUS | Status: DC | PRN
  Administered 2018-05-01: 100 ug/kg/min via INTRAVENOUS

## 2018-05-01 MED ORDER — PHENOL 1.4 % MT LIQD: 1.0000 | OROMUCOSAL | Status: DC | PRN

## 2018-05-01 MED ORDER — FENTANYL CITRATE (PF) 100 MCG/2ML IJ SOLN
INTRAMUSCULAR | Status: AC
  Filled 2018-05-01: qty 2

## 2018-05-01 MED ORDER — PROPOFOL 10 MG/ML IV BOLUS
INTRAVENOUS | Status: AC
  Filled 2018-05-01: qty 20

## 2018-05-01 MED ORDER — STERILE WATER FOR IRRIGATION IR SOLN
Status: DC | PRN
  Administered 2018-05-01: 12:00:00

## 2018-05-01 NOTE — Op Note (Signed)
[facility] Patient Name: Warren Stone Procedure Date: 05/01/2018 11:41 AM MRN: [id number] Account #: [id number] Date of Birth: 29-Dec-1950 Admit Type: Inpatient Age: 67 Room: [facility] ENDO ROOM 4 Gender: Male Note Status: Finalized Procedure:            Colonoscopy Indications:          Hematochezia Providers:            Jamauri Kruzel B. Bonna Gains MD, MD Referring MD:         [name] MD, MD (Referring MD) Medicines:            Monitored Anesthesia Care Complications:        No immediate complications. Procedure:            Pre-Anesthesia Assessment:                       - ASA Grade Assessment: III - A patient with severe                        systemic disease.                       - Prior to the procedure, a History and Physical was                        performed, and patient medications, allergies and                        sensitivities were reviewed. The patient's tolerance of                        previous anesthesia was reviewed.                       - The risks and benefits of the procedure and the                        sedation options and risks were discussed with the                        patient. All questions were answered and informed                        consent was obtained.                       - Patient identification and proposed procedure were                        verified prior to the procedure by the physician, the                        nurse, the anesthesiologist, the anesthetist and the                        technician. The procedure was verified in the procedure                        room.                       After obtaining informed  consent, the colonoscope was                        passed under direct vision. Throughout the procedure,                        the patient's blood pressure, pulse, and oxygen                        saturations were monitored continuously. The                         Colonoscope was introduced through the anus and                        advanced to the the cecum, identified by appendiceal                        orifice and ileocecal valve. The colonoscopy was                        performed with ease. The patient tolerated the                        procedure well. The quality of the bowel preparation                        was fair. Findings:      The perianal and digital rectal examinations were normal.      A 4 mm polyp was found in the rectum. The polyp was sessile. The polyp       was removed with a cold snare. Resection and retrieval were complete.      Multiple diverticula were found in the sigmoid colon.      Patchy mild mucosal changes characterized by adherent blood, altered       vascularity, erythema and friability were found in the rectum.       Coagulation for hemostasis using argon plasma was successful.      The exam was otherwise without abnormality.      Non-bleeding internal hemorrhoids were found during retroflexion. Impression:           - Preparation of the colon was fair.                       - One 4 mm polyp in the rectum, removed with a cold                        snare. Resected and retrieved.                       - Diverticulosis in the sigmoid colon.                       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 Treated with argon                        plasma coagulation (APC).                       -  The examination was otherwise normal.                       - Non-bleeding internal hemorrhoids.                       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                       - This was not a screening exam for polyps as it was                        done for hematochezia. Patient will need to follow up                        in clinic and screening can be scheduled at that time. Recommendation:        - Advance diet as tolerated.                       - Continue present medications.                       - Await pathology results.                       - The findings and recommendations were discussed with                        the patient.                       - The findings and recommendations were discussed with                        the patient's family.                       - Return to primary care physician as previously                        scheduled.                       - High fiber diet.                       - Use Miralax daily to maintain soft stool                       - Return to my office in 4 weeks. Procedure Code(s):    --- Professional ---                       [phone], 102, Colonoscopy, flexible; with control of                        bleeding, any method                       45385, Colonoscopy, flexible; with removal of tumor(s),  polyp(s), or other lesion(s) by snare technique Diagnosis Code(s):    --- Professional ---                       K64.8, Other hemorrhoids                       K62.1, Rectal polyp                       K62.7, Radiation proctitis                       K92.1, Melena (includes Hematochezia)                       K57.30, Diverticulosis of large intestine without                        perforation or abscess without bleeding CPT copyright 2017 American Medical Association. All rights reserved. The codes documented in this report are preliminary and upon coder review may  be revised to meet current compliance requirements.  [name], MD Margretta Sidle B. Bonna Gains MD, MD 05/01/2018 1:28:57 PM This report has been signed electronically. Number of Addenda: 0 Note Initiated On: 05/01/2018 11:41 AM Scope Withdrawal Time: 0 hours 23 minutes 3 seconds  Total Procedure Duration: 0 hours 49 minutes 36 seconds  Estimated Blood Loss: Estimated blood loss: none.      [facility]

## 2018-05-01 NOTE — Discharge Instructions (Signed)
Soft diet for 5 days and then advance as tolerated  Can start taking aspirin from 05/04/2018 if no further bleeding

## 2018-05-01 NOTE — Transfer of Care (Signed)
Immediate Anesthesia Transfer of Care Note  Patient: Warren Nabers Stenseth Sr.  Procedure(s) Performed: ESOPHAGOGASTRODUODENOSCOPY (EGD) (N/A ) COLONOSCOPY (N/A )  Patient Location: PACU  Anesthesia Type:General  Level of Consciousness: drowsy  Airway & Oxygen Therapy: Patient Spontanous Breathing and Patient connected to nasal cannula oxygen  Post-op Assessment: Report given to RN and Post -op Vital signs reviewed and stable  Post vital signs: Reviewed and stable  Last Vitals:  Vitals Value Taken Time  BP    Temp    Pulse    Resp    SpO2      Last Pain:  Vitals:   05/01/18 1051  TempSrc: Tympanic  PainSc: 0-No pain         Complications: No apparent anesthesia complications

## 2018-05-01 NOTE — Anesthesia Preprocedure Evaluation (Addendum)
Anesthesia Evaluation  Patient identified by MRN, date of birth, ID band Patient awake    Reviewed: Allergy & Precautions, H&P , NPO status , Patient's Chart, lab work & pertinent test results, reviewed documented beta blocker date and time   Airway Mallampati: I  TM Distance: >3 FB Neck ROM: full    Dental  (+) Dental Advidsory Given, Edentulous Upper, Edentulous Lower   Pulmonary neg pulmonary ROS, former smoker,           Cardiovascular Exercise Tolerance: Good hypertension, (-) angina+ CAD, + Past MI and + Cardiac Stents  (-) CABG (-) dysrhythmias (-) Valvular Problems/Murmurs     Neuro/Psych neg Seizures CVA, No Residual Symptoms negative psych ROS   GI/Hepatic Neg liver ROS, GERD  ,  Endo/Other  diabetes, Oral Hypoglycemic Agents  Renal/GU Renal disease (kidney stone)  negative genitourinary   Musculoskeletal   Abdominal   Peds  Hematology negative hematology ROS (+)   Anesthesia Other Findings Past Medical History: 10/29/2015: Arteriosclerosis of coronary artery     Comment:  Overview:  with mi  No date: Arthritis No date: BPH (benign prostatic hyperplasia) No date: Diabetes mellitus without complication ([place]) No date: Gout 1996: Heart attack ([place]) No date: Hypertension No date: Prostate cancer ([place]) No date: Stroke ([place])   Reproductive/Obstetrics negative OB ROS                            Anesthesia Physical Anesthesia Plan  ASA: III  Anesthesia Plan: General   Post-op Pain Management:    Induction: Intravenous  PONV Risk Score and Plan: 2 and Propofol infusion  Airway Management Planned: Nasal Cannula  Additional Equipment:   Intra-op Plan:   Post-operative Plan:   Informed Consent: I have reviewed the patients History and Physical, chart, labs and discussed the procedure including the risks, benefits and alternatives for the proposed anesthesia with the patient  or authorized representative who has indicated his/her understanding and acceptance.   Dental Advisory Given  Plan Discussed with: Anesthesiologist, CRNA and Surgeon  Anesthesia Plan Comments:         Anesthesia Quick Evaluation

## 2018-05-01 NOTE — OR Nursing (Signed)
Roselyn Reef here to transport pt back to his room.

## 2018-05-01 NOTE — Progress Notes (Signed)
MD order received to discharge pt home today; verbally reviewed AVS with pt; Rx for Protonix escribed to [facility] in [place] ; no questions voiced at this time; pt discharged via wheelchair by nursing to the visitor's entrance

## 2018-05-01 NOTE — Progress Notes (Signed)
Warren Antigua, MD [address], [place], [place], [place], [address] [address], [place], [place], [place], [address] Phone: [phone]  Fax: [phone]   Subjective: Pt. Reports prep went well. Initially hard some BRBPR right after prep, then subsequent Bowel movements were without any blood. No N/V or hematemesis or abdominal pain.    Objective: Exam: Vital signs in last 24 hours: Vitals:   04/30/18 2034 05/01/18 0416 05/01/18 0802 05/01/18 1051  BP: (!) 158/74 (!) 117/49 138/72 (!) 163/72  Pulse: 71 66 62 61  Resp: 18 18 20    Temp: 97.7 F (36.5 C) 97.9 F (36.6 C) 97.9 F (36.6 C) 97.9 F (36.6 C)  TempSrc: Oral Oral Oral Tympanic  SpO2: 100% 98% 99% 99%  Weight:  262 lb 4.8 oz (119 kg)    Height:       Weight change: -4 lb 11.2 oz (-2.132 kg)  Intake/Output Summary (Last 24 hours) at 05/01/2018 1147 Last data filed at 05/01/2018 1034 Gross per 24 hour  Intake 5811.65 ml  Output -  Net 5811.65 ml    General: No acute distress, AAO x3 Abd: Soft, NT/ND, No HSM Skin: Warm, no rashes Neck: Supple, Trachea midline   Lab Results: Lab Results  Component Value Date   WBC 10.3 04/29/2018   HGB 13.3 05/01/2018   HCT 43.0 04/29/2018   MCV 80.9 04/29/2018   PLT 165 04/29/2018   Micro Results: No results found for this or any previous visit (from the past 240 hour(s)). Studies/Results: No results found. Medications:  Scheduled Meds: . [MAR Hold] amLODipine  5 mg Oral Daily  . [MAR Hold] atorvastatin  30 mg Oral Daily  . [MAR Hold] cholecalciferol  1,000 Units Oral Daily  . [MAR Hold] cloNIDine  0.1 mg Oral BID  . [MAR Hold] docusate sodium  100 mg Oral BID  . [MAR Hold] fenofibrate  160 mg Oral Daily  . [MAR Hold] ferrous sulfate  325 mg Oral Q breakfast  . [MAR Hold] hydrochlorothiazide  25 mg Oral Daily  . [MAR Hold] insulin aspart  0-5 Units Subcutaneous QHS  . [MAR Hold] insulin aspart  0-9 Units Subcutaneous Q4H  . [MAR Hold] metoprolol   200 mg Oral Daily  . [MAR Hold] potassium chloride SA  20 mEq Oral Daily  . [MAR Hold] rOPINIRole  0.5 mg Oral QHS  . [MAR Hold] vitamin B-12  100 mcg Oral Daily  . [MAR Hold] vitamin C  1,000 mg Oral Daily   Continuous Infusions: . sodium chloride 75 mL/hr at 05/01/18 1034  . pantoprozole (PROTONIX) infusion 8 mg/hr (05/01/18 0035)   PRN Meds:.[MAR Hold] acetaminophen **OR** [MAR Hold] acetaminophen, [MAR Hold] colchicine, [MAR Hold] ondansetron **OR** [MAR Hold] ondansetron (ZOFRAN) IV   Assessment: Active Problems:   GI bleed    Plan: Improved and stable at this time Proceed with EGD and colonoscopy as planned today for evaluation of source of GI bleed Diverticulosis vs hemorrhoids vs malignancy Ischemic colitis less likely Due to high dose aspirin use, will evaluate for PUD with EGD as well.   PPI IV twice daily  Continue serial CBCs and transfuse PRN Avoid NSAIDs Maintain 2 large-bore IV lines Please page GI with any acute hemodynamic changes, or signs of active GI bleeding   I have discussed alternative options, risks & benefits,  which include, but are not limited to, bleeding, infection, perforation,respiratory complication & drug reaction.  The patient agrees with this plan & written consent will be obtained.  LOS: 1 day   Warren Antigua, MD 05/01/2018, 11:47 AM

## 2018-05-01 NOTE — Anesthesia Post-op Follow-up Note (Signed)
Anesthesia QCDR form completed.        

## 2018-05-01 NOTE — Anesthesia Postprocedure Evaluation (Signed)
Anesthesia Post Note  Patient: Warren Seehafer Winters Sr.  Procedure(s) Performed: ESOPHAGOGASTRODUODENOSCOPY (EGD) (N/A ) COLONOSCOPY (N/A )  Patient location during evaluation: Endoscopy Anesthesia Type: General Level of consciousness: awake and alert and oriented Pain management: pain level controlled Vital Signs Assessment: post-procedure vital signs reviewed and stable Respiratory status: spontaneous breathing Cardiovascular status: blood pressure returned to baseline Anesthetic complications: no     Last Vitals:  Vitals:   05/01/18 1400 05/01/18 1446  BP: 136/77 (!) 146/70  Pulse: 88 75  Resp: 15 20  Temp:  (!) 36.4 C  SpO2: 100% 99%    Last Pain:  Vitals:   05/01/18 1446  TempSrc: Oral  PainSc:                  Beatrice Ziehm

## 2018-05-01 NOTE — H&P (Signed)
[name], MD [address], [place], [place], [place], [address] [address], [place], [place], [place], [address] Phone: [phone]  Fax: [phone]  Primary Care Physician:  [name], MD   Pre-Procedure History & Physical: HPI:  Warren Tiberio Sausedo Sr. is a 67 y.o. male is here for a colonoscopy and EGD.   Past Medical History:  Diagnosis Date  . Arteriosclerosis of coronary artery 10/29/2015   Overview:  with mi   . Arthritis   . BPH (benign prostatic hyperplasia)   . Diabetes mellitus without complication ([place])   . Gout   . Heart attack ([place]) 1996  . Hypertension   . Prostate cancer ([place])   . Stroke [facility])     Past Surgical History:  Procedure Laterality Date  . CHOLECYSTECTOMY    . RIGHT HEART CATHETERIZATION WITH ADENOSINE STUDY  1996    Prior to Admission medications   Medication Sig Start Date End Date Taking? Authorizing Provider  amLODipine (NORVASC) 5 MG tablet Take 5 mg by mouth daily.  04/11/15  Yes [provider]  Ascorbic Acid (VITAMIN C) 1000 MG tablet Take 1,000 mg by mouth daily.   Yes [provider]  aspirin EC 81 MG tablet Take 81 mg by mouth daily.   Yes [provider]  atorvastatin (LIPITOR) 20 MG tablet Take 30 mg by mouth daily.   Yes [provider]  Cholecalciferol (VITAMIN D3) 1000 UNITS CAPS Take 1 capsule by mouth daily.   Yes [provider]  cloNIDine (CATAPRES) 0.1 MG tablet Take 0.1 mg by mouth 2 (two) times daily.  03/30/15  Yes [provider]  colchicine 0.6 MG tablet Take 0.6 mg by mouth daily as needed (gout flare). Reported on 11/25/2015 04/08/15  Yes [provider]  fenofibrate 160 MG tablet Take 160 mg by mouth daily.   Yes [provider]  ferrous sulfate (IRON SUPPLEMENT) 325 (65 FE) MG tablet Take 325 mg by mouth daily with breakfast.   Yes [provider]  glipiZIDE (GLUCOTROL XL) 5 MG 24 hr tablet Take 5 mg by mouth daily  with breakfast.  03/22/15  Yes [provider]  hydrochlorothiazide (HYDRODIURIL) 25 MG tablet Take 25 mg by mouth daily.  04/11/15  Yes [provider]  metFORMIN (GLUCOPHAGE-XR) 500 MG 24 hr tablet Take 1,000 mg by mouth daily with supper.  05/14/16  Yes [provider]  metoprolol (TOPROL-XL) 200 MG 24 hr tablet Take 200 mg by mouth daily.  04/11/15  Yes [provider]  Omega-3 Fatty Acids (FISH OIL) 1000 MG CAPS Take 2,000 mg by mouth daily.   Yes [provider]  potassium chloride SA (K-DUR,KLOR-CON) 20 MEQ tablet Take 20 mEq by mouth daily.  04/11/15  Yes [provider]  ramipril (ALTACE) 10 MG capsule Take 10 mg by mouth daily.  03/30/15  Yes [provider]  rOPINIRole (REQUIP) 0.25 MG tablet Take 0.5 mg by mouth at bedtime.  04/18/15  Yes [provider]  vitamin B-12 (CYANOCOBALAMIN) 100 MCG tablet Take 100 mcg by mouth daily.   Yes [provider]  finasteride (PROSCAR) 5 MG tablet Take 1 tablet (5 mg total) by mouth daily. Patient not taking: Reported on 04/30/2018 01/21/18   Bjorn Loser, MD  tamsulosin (FLOMAX) 0.4 MG CAPS capsule Take 1 capsule (0.4 mg total) by mouth daily. Patient not taking: Reported on 12/31/2017 03/28/17   Nickie Retort, MD    Allergies as of 04/29/2018 - Review  Complete 04/29/2018  Allergen Reaction Noted  . Pravastatin Nausea And Vomiting 08/26/2015  . Statins Rash 04/21/2015    History reviewed. No pertinent family history.  Social History   Socioeconomic History  . Marital status: Married    Spouse name: Not on file  . Number of children: Not on file  . Years of education: Not on file  . Highest education level: Not on file  Occupational History  . Not on file  Social Needs  . Financial resource strain: Not very hard  . Food insecurity:    Worry: Patient refused    Inability: Patient refused  . Transportation needs:    Medical: Patient refused    Non-medical:  Patient refused  Tobacco Use  . Smoking status: Former Smoker    Last attempt to quit: 09/15/1995    Years since quitting: 22.6  . Smokeless tobacco: Never Used  Substance and Sexual Activity  . Alcohol use: No    Alcohol/week: 0.0 oz  . Drug use: No  . Sexual activity: Yes  Lifestyle  . Physical activity:    Days per week: Patient refused    Minutes per session: Patient refused  . Stress: Only a little  Relationships  . Social connections:    Talks on phone: Patient refused    Gets together: Patient refused    Attends religious service: Patient refused    Active member of club or organization: Patient refused    Attends meetings of clubs or organizations: Patient refused    Relationship status: Patient refused  . Intimate partner violence:    Fear of current or ex partner: Patient refused    Emotionally abused: Patient refused    Physically abused: Patient refused    Forced sexual activity: Patient refused  Other Topics Concern  . Not on file  Social History Narrative  . Not on file    Review of Systems: See HPI, otherwise negative ROS  Physical Exam: BP (!) 163/72   Pulse 61   Temp 97.9 F (36.6 C) (Tympanic)   Resp 20   Ht 6\' 2"  (1.88 m)   Wt 262 lb 4.8 oz (119 kg)   SpO2 99%   BMI 33.68 kg/m  General:   Alert,  pleasant and cooperative in NAD Head:  Normocephalic and atraumatic. Neck:  Supple; no masses or thyromegaly. Lungs:  Clear throughout to auscultation, normal respiratory effort.    Heart:  +S1, +S2, Regular rate and rhythm, No edema. Abdomen:  Soft, nontender and nondistended. Normal bowel sounds, without guarding, and without rebound.   Neurologic:  Alert and  oriented x4;  grossly normal neurologically.  Impression/Plan: Warren Cera Kost Sr. is here for a colonoscopy and EGD for Hematochezia and melena  Risks, benefits, limitations, and alternatives regarding the procedures have been reviewed with the patient.  Questions have been answered.   All parties agreeable.   Virgel Manifold, MD  05/01/2018, 11:46 AM

## 2018-05-01 NOTE — Plan of Care (Signed)
  Problem: Education: Goal: Knowledge of General Education information will improve Outcome: Progressing   Problem: Health Behavior/Discharge Planning: Goal: Ability to manage health-related needs will improve Outcome: Progressing   Problem: Education: Goal: Ability to identify signs and symptoms of gastrointestinal bleeding will improve Outcome: Progressing   Problem: Bowel/Gastric: Goal: Will show no signs and symptoms of gastrointestinal bleeding Outcome: Progressing

## 2018-05-01 NOTE — Op Note (Signed)
[facility] Gastroenterology Patient Name: Warren Stone Procedure Date: 05/01/2018 11:42 AM MRN: [id number] Account #: [id number] Date of Birth: 1951-03-17 Admit Type: Inpatient Age: 67 Room: [facility] ENDO ROOM 4 Gender: Male Note Status: Finalized Procedure:            Upper GI endoscopy Indications:          Melena Providers:             B. Bonna Gains MD, MD Referring MD:         [name] MD, MD (Referring MD) Medicines:            Monitored Anesthesia Care Complications:        No immediate complications. Procedure:            Pre-Anesthesia Assessment:                       - The risks and benefits of the procedure and the                        sedation options and risks were discussed with the                        patient. All questions were answered and informed                        consent was obtained.                       - Patient identification and proposed procedure were                        verified prior to the procedure.                       - ASA Grade Assessment: III - A patient with severe                        systemic disease.                       After obtaining informed consent, the endoscope was                        passed under direct vision. Throughout the procedure,                        the patient's blood pressure, pulse, and oxygen                        saturations were monitored continuously. The Endoscope                        was introduced through the mouth, and advanced to the                        second part of duodenum. The upper GI endoscopy was                        accomplished with ease. The patient tolerated the  procedure well. Findings:      A single 5 mm mucosal nodule was found at the gastroesophageal junction.       Biopsies were taken with a cold forceps for histology. For hemostasis,       two hemostatic clips were successfully placed. There was no bleeding at       the end of the procedure.      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      The entire examined stomach was normal.      There is no endoscopic evidence of bleeding or ulceration in the entire       examined stomach.      The examined duodenum was normal.      There is no endoscopic evidence of bleeding or ulceration in the entire       examined duodenum. Impression:           - Mucosal nodule found in the esophagus. Biopsied.                        Clips were placed.                       - Ectopic gastric mucosa in the upper third of the                        esophagus.                       - Normal stomach.                       - Normal examined duodenum.                       - No findings to explain patient's symptoms, proceed to                        colonoscopy. The GE junction nodule seen today was an                        incidental finding. Recommendation:       - Return patient to hospital ward for ongoing care.                       - Soft diet for 7 days.                       - Use Prilosec (omeprazole) 20 mg PO daily.                       - Follow an antireflux regimen.                       - Await pathology results.                       - The findings and recommendations were discussed with                        the patient.                       -  The findings and recommendations were discussed with                        the patient's family. Procedure Code(s):    --- Professional ---                       [phone], 55, Esophagogastroduodenoscopy, flexible,                        transoral; with control of bleeding, any method                       43239, Esophagogastroduodenoscopy, flexible, transoral;                        with biopsy, single or multiple Diagnosis Code(s):    --- Professional ---                       K22.8, Other specified diseases of esophagus                        Q40.2, Other specified congenital malformations of                        stomach                       K92.1, Melena (includes Hematochezia) CPT copyright 2017 [employer]. All rights reserved. The codes documented in this report are preliminary and upon coder review may  be revised to meet current compliance requirements.  [name], MD Margretta Sidle B. Bonna Gains MD, MD 05/01/2018 12:19:13 PM This report has been signed electronically. Number of Addenda: 0 Note Initiated On: 05/01/2018 11:42 AM Estimated Blood Loss: Estimated blood loss: none.      [facility]

## 2018-05-02 ENCOUNTER — Telehealth: Payer: Self-pay | Admitting: Gastroenterology

## 2018-05-02 NOTE — Telephone Encounter (Signed)
Left vm to get scheduled for 4-8 week Fu with Dr. Bonna Gains

## 2018-05-03 ENCOUNTER — Encounter: Payer: Self-pay | Admitting: Gastroenterology

## 2018-05-03 NOTE — Discharge Summary (Signed)
[place] [facility] [place] NAME: Warren Stone    MR#:  [id number]  DATE OF BIRTH:  1951/05/24  DATE OF ADMISSION:  04/30/2018 ADMITTING PHYSICIAN: Harrie Foreman, MD  DATE OF DISCHARGE: 05/01/2018  5:57 PM  PRIMARY CARE PHYSICIAN: [name], MD   ADMISSION DIAGNOSIS:  Hematochezia [K92.1] Melena [K92.1] Rectal bleeding [K62.5]  DISCHARGE DIAGNOSIS:  Active Problems:   Blood in stool   Radiation proctitis   Internal hemorrhoids   Diverticulosis of large intestine without diverticulitis   Rectal polyp   Ectopic gastric tissue   Esophageal polyp   SECONDARY DIAGNOSIS:   Past Medical History:  Diagnosis Date  . Arteriosclerosis of coronary artery 10/29/2015   Overview:  with mi   . Arthritis   . BPH (benign prostatic hyperplasia)   . Diabetes mellitus without complication ([place])   . Gout   . Heart attack ([place]) 1996  . Hypertension   . Prostate cancer ([place])   . Stroke [facility])      ADMITTING HISTORY  Chief Complaint: Rectal bleeding HPI: The patient with past medical history of CAD status post MI and stent placement, hypertension, stroke and BPH presents the emergency department with rectal bleeding.  The patient states that he noticed bright red blood had soaked through his pants and onto a chair.  When the patient went to clean himself he also noticed clotted tarry looking blood on his toilet tissue.  The patient had an episode of bright red blood a few weeks before but thought that he had strained himself while working that made his hemorrhoids bleed.  The patient denies any pain with his bleeding.  He also denies nausea, vomiting or diarrhea.  The patient admits to severe constipation however.  He has not had a colonoscopy.  Due to his rectal bleeding the emergency department staff called the hospitalist service for admission.  HOSPITAL COURSE:   *Bright red blood per rectum. Patient was admitted to medical floor. Evaluated  by G.I. Patient had EGD and colonoscopy. EGD was normal. Endoscopy showed diverticulosis, radiation proctitis which is thought to be the reason for his hematochezia. Hemoglobin remains stable. Not anemic. Did not need transfusions. Patient has been started on soft diet as per G.I. recommendations which will be continued for the next five days. He has been advised to hold his aspirin for the next three days and restart. Patient will follow up with G.I. and primary care physician is outpatient.  No further bleeding.  Discharged home in stable condition.  CONSULTS OBTAINED:  Treatment Team:  Virgel Manifold, MD  DRUG ALLERGIES:   Allergies  Allergen Reactions  . Pravastatin Nausea And Vomiting    Other reaction(s): Unknown  . Statins Rash    DISCHARGE MEDICATIONS:   Allergies as of 05/01/2018      Reactions   Pravastatin Nausea And Vomiting   Other reaction(s): Unknown   Statins Rash      Medication List    TAKE these medications   amLODipine 5 MG tablet Commonly known as:  NORVASC Take 5 mg by mouth daily.   aspirin EC 81 MG tablet Take 81 mg by mouth daily.   atorvastatin 20 MG tablet Commonly known as:  LIPITOR Take 30 mg by mouth daily.   cloNIDine 0.1 MG tablet Commonly known as:  CATAPRES Take 0.1 mg by mouth 2 (two) times daily.   colchicine 0.6 MG tablet Take 0.6 mg by mouth daily as needed (gout flare). Reported  on 11/25/2015   fenofibrate 160 MG tablet Take 160 mg by mouth daily.   finasteride 5 MG tablet Commonly known as:  PROSCAR Take 1 tablet (5 mg total) by mouth daily.   Fish Oil 1000 MG Caps Take 2,000 mg by mouth daily.   glipiZIDE 5 MG 24 hr tablet Commonly known as:  GLUCOTROL XL Take 5 mg by mouth daily with breakfast.   hydrochlorothiazide 25 MG tablet Commonly known as:  HYDRODIURIL Take 25 mg by mouth daily.   IRON SUPPLEMENT 325 (65 FE) MG tablet Generic drug:  ferrous sulfate Take 325 mg by mouth daily with breakfast.    metFORMIN 500 MG 24 hr tablet Commonly known as:  GLUCOPHAGE-XR Take 1,000 mg by mouth daily with supper.   metoprolol 200 MG 24 hr tablet Commonly known as:  TOPROL-XL Take 200 mg by mouth daily.   pantoprazole 40 MG tablet Commonly known as:  PROTONIX Take 1 tablet (40 mg total) by mouth daily.   potassium chloride SA 20 MEQ tablet Commonly known as:  K-DUR,KLOR-CON Take 20 mEq by mouth daily.   ramipril 10 MG capsule Commonly known as:  ALTACE Take 10 mg by mouth daily.   rOPINIRole 0.25 MG tablet Commonly known as:  REQUIP Take 0.5 mg by mouth at bedtime.   tamsulosin 0.4 MG Caps capsule Commonly known as:  FLOMAX Take 1 capsule (0.4 mg total) by mouth daily.   vitamin B-12 100 MCG tablet Commonly known as:  CYANOCOBALAMIN Take 100 mcg by mouth daily.   vitamin C 1000 MG tablet Take 1,000 mg by mouth daily.   Vitamin D3 1000 units Caps Take 1 capsule by mouth daily.       Today   VITAL SIGNS:  Blood pressure 130/61, pulse 69, temperature (!) 97.5 F (36.4 C), temperature source Oral, resp. rate 20, height 6\' 2"  (1.88 m), weight 119 kg (262 lb 4.8 oz), SpO2 100 %.  I/O:  No intake or output data in the 24 hours ending 05/03/18 1645  PHYSICAL EXAMINATION:  Physical Exam  GENERAL:  67 y.o.-year-old patient lying in the bed with no acute distress.  LUNGS: Normal breath sounds bilaterally, no wheezing, rales,rhonchi or crepitation. No use of accessory muscles of respiration.  CARDIOVASCULAR: S1, S2 normal. No murmurs, rubs, or gallops.  ABDOMEN: Soft, non-tender, non-distended. Bowel sounds present. No organomegaly or mass.  NEUROLOGIC: Moves all 4 extremities. PSYCHIATRIC: The patient is alert and oriented x 3.  SKIN: No obvious rash, lesion, or ulcer.   DATA REVIEW:   CBC Recent Labs  Lab 04/29/18 1930 05/01/18 0807  WBC 10.3  --   HGB 14.9 13.3  HCT 43.0  --   PLT 165  --     Chemistries  Recent Labs  Lab 04/29/18 1930  NA 136  K 3.9   CL 99*  CO2 28  GLUCOSE 203*  BUN 23*  CREATININE 1.35*  CALCIUM 9.2  AST 29  ALT 25  ALKPHOS 54  BILITOT 0.7    Cardiac Enzymes No results for input(s): TROPONINI in the last 168 hours.  Microbiology Results  Results for orders placed or performed in visit on 11/25/15  Microscopic Examination     Status: Abnormal   Collection Time: 11/25/15 10:43 AM  Result Value Ref Range Status   WBC, UA None seen 0 - 5 /hpf Final   RBC, UA 0-2 0 - 2 /hpf Final   Epithelial Cells (non renal) None seen 0 - 10 /hpf Final  Mucus, UA Present (A) Not Estab. Final   Bacteria, UA Few (A) None seen/Few Final    RADIOLOGY:  No results found.  Follow up with PCP in 1 week.  Management plans discussed with the patient, family and they are in agreement.  CODE STATUS:  Code Status History    Date Active Date Inactive Code Status Order ID Comments User Context   04/30/2018 0426 05/01/2018 2108 Full Code [id number]  Harrie Foreman, MD Inpatient    Advance Directive Documentation     Most Recent Value  Type of Advance Directive  Healthcare Power of Attorney, Living will  Pre-existing out of facility DNR order (yellow form or pink MOST form)  -  "MOST" Form in Place?  -      TOTAL TIME TAKING CARE OF THIS PATIENT ON DAY OF DISCHARGE: more than 30 minutes.   Leia Alf Shaana Acocella M.D on 05/03/2018 at 4:45 PM  Between 7am to 6pm - Pager - [phone]  After 6pm go to [email or web address] - password [email or web address] [facility]  Office  [phone]  CC: Primary care physician; [name], MD  Note: This dictation was prepared with Dragon dictation along with smaller phrase technology. Any transcriptional errors that result from this process are unintentional.

## 2018-05-04 ENCOUNTER — Other Ambulatory Visit: Payer: Self-pay

## 2018-05-04 ENCOUNTER — Encounter: Payer: Self-pay | Admitting: Emergency Medicine

## 2018-05-04 ENCOUNTER — Emergency Department: Payer: Medicare Other

## 2018-05-04 ENCOUNTER — Emergency Department
Admission: EM | Admit: 2018-05-04 | Discharge: 2018-05-05 | Disposition: A | Discharge status: Home / Self Care | Payer: Medicare Other | Attending: Emergency Medicine | Admitting: Emergency Medicine

## 2018-05-04 DIAGNOSIS — Z79899 Other long term (current) drug therapy: Secondary | ICD-10-CM | POA: Diagnosis not present

## 2018-05-04 DIAGNOSIS — I2510 Atherosclerotic heart disease of native coronary artery without angina pectoris: Secondary | ICD-10-CM | POA: Insufficient documentation

## 2018-05-04 DIAGNOSIS — R42 Dizziness and giddiness: Secondary | ICD-10-CM | POA: Diagnosis present

## 2018-05-04 DIAGNOSIS — Z7982 Long term (current) use of aspirin: Secondary | ICD-10-CM | POA: Diagnosis not present

## 2018-05-04 DIAGNOSIS — Z87891 Personal history of nicotine dependence: Secondary | ICD-10-CM | POA: Insufficient documentation

## 2018-05-04 DIAGNOSIS — R112 Nausea with vomiting, unspecified: Secondary | ICD-10-CM | POA: Diagnosis not present

## 2018-05-04 DIAGNOSIS — Z8546 Personal history of malignant neoplasm of prostate: Secondary | ICD-10-CM | POA: Diagnosis not present

## 2018-05-04 DIAGNOSIS — R51 Headache: Secondary | ICD-10-CM | POA: Insufficient documentation

## 2018-05-04 DIAGNOSIS — Z7984 Long term (current) use of oral hypoglycemic drugs: Secondary | ICD-10-CM | POA: Insufficient documentation

## 2018-05-04 DIAGNOSIS — Z8673 Personal history of transient ischemic attack (TIA), and cerebral infarction without residual deficits: Secondary | ICD-10-CM | POA: Diagnosis not present

## 2018-05-04 DIAGNOSIS — I10 Essential (primary) hypertension: Secondary | ICD-10-CM | POA: Diagnosis not present

## 2018-05-04 DIAGNOSIS — R61 Generalized hyperhidrosis: Secondary | ICD-10-CM | POA: Diagnosis not present

## 2018-05-04 DIAGNOSIS — E119 Type 2 diabetes mellitus without complications: Secondary | ICD-10-CM | POA: Insufficient documentation

## 2018-05-04 LAB — COMPREHENSIVE METABOLIC PANEL
ALT: 17 U/L (ref 17–63)
AST: 23 U/L (ref 15–41)
Albumin: 3.9 g/dL (ref 3.5–5.0)
Alkaline Phosphatase: 41 U/L (ref 38–126)
Anion gap: 9 (ref 5–15)
BILIRUBIN TOTAL: 0.5 mg/dL (ref 0.3–1.2)
BUN: 13 mg/dL (ref 6–20)
CO2: 27 mmol/L (ref 22–32)
CREATININE: 1.07 mg/dL (ref 0.61–1.24)
Calcium: 9.1 mg/dL (ref 8.9–10.3)
Chloride: 100 mmol/L — ABNORMAL LOW (ref 101–111)
Glucose, Bld: 197 mg/dL — ABNORMAL HIGH (ref 65–99)
POTASSIUM: 3.5 mmol/L (ref 3.5–5.1)
Sodium: 136 mmol/L (ref 135–145)
Total Protein: 7.3 g/dL (ref 6.5–8.1)

## 2018-05-04 LAB — PROTIME-INR
INR: 1.01
Prothrombin Time: 13.2 seconds (ref 11.4–15.2)

## 2018-05-04 LAB — CBC
HEMATOCRIT: 38.7 % — AB (ref 40.0–52.0)
Hemoglobin: 13.4 g/dL (ref 13.0–18.0)
MCH: 27.9 pg (ref 26.0–34.0)
MCHC: 34.6 g/dL (ref 32.0–36.0)
MCV: 80.7 fL (ref 80.0–100.0)
Platelets: 131 10*3/uL — ABNORMAL LOW (ref 150–440)
RBC: 4.8 MIL/uL (ref 4.40–5.90)
RDW: 14.3 % (ref 11.5–14.5)
WBC: 8.8 10*3/uL (ref 3.8–10.6)

## 2018-05-04 LAB — APTT: APTT: 33 s (ref 24–36)

## 2018-05-04 LAB — TYPE AND SCREEN
ABO/RH(D): A POS
Antibody Screen: NEGATIVE

## 2018-05-04 LAB — TROPONIN I

## 2018-05-04 MED ORDER — SODIUM CHLORIDE 0.9 % IV SOLN
1000.0000 mL | Freq: Once | INTRAVENOUS | Status: AC
  Administered 2018-05-04: 1000 mL via INTRAVENOUS

## 2018-05-04 MED ORDER — MECLIZINE HCL 25 MG PO TABS
50.0000 mg | ORAL_TABLET | Freq: Once | ORAL | Status: AC
  Administered 2018-05-04: 50 mg via ORAL
  Filled 2018-05-04: qty 2

## 2018-05-04 MED ORDER — ONDANSETRON HCL 4 MG/2ML IJ SOLN
4.0000 mg | Freq: Once | INTRAMUSCULAR | Status: AC
  Administered 2018-05-04: 4 mg via INTRAVENOUS
  Filled 2018-05-04: qty 2

## 2018-05-04 NOTE — ED Triage Notes (Signed)
Pt has been experiencing N/V with dizziness x 1 hour. Dizziness increased with movement. Pt received 4mg  zofran en route via EMS. Pt vomiting upon arrival. Pt has hx of vertigo but does not typically take medication. States his wife made him take meclizine approx 1 hour ago. Currently denying any chest pain.   Pt was here Monday for rectal bleeding. No blood administration at that visit. Endoscopy and colonoscopy done as follow-up and only polyps found.

## 2018-05-04 NOTE — ED Provider Notes (Signed)
[facility] Emergency Department Provider Note   ____________________________________________    I have reviewed the triage vital signs and the nursing notes.   HISTORY  Chief Complaint Dizziness     HPI Warren Pound Mcnay Sr. is a 68 y.o. male with a history of diabetes, coronary artery disease, stroke who presents with complaints of dizziness nausea and vomiting.  Patient reports at approximately 630 he started to feel a sensation of the room spinning and became quite unsteady.  He broke out into cold sweat and became very nauseated and vomited several times.  Nonbilious nonbloody.  He complains of a mild headache frontally but no neuro deficits.  Recent admission for hematochezia per medical records, but had been feeling well since discharge   Past Medical History:  Diagnosis Date  . Arteriosclerosis of coronary artery 10/29/2015   Overview:  with mi   . Arthritis   . BPH (benign prostatic hyperplasia)   . Diabetes mellitus without complication ([place])   . Gout   . Heart attack ([place]) 1996  . Hypertension   . Prostate cancer ([place])   . Stroke [facility])     Patient Active Problem List   Diagnosis Date Noted  . Radiation proctitis   . Internal hemorrhoids   . Diverticulosis of large intestine without diverticulitis   . Rectal polyp   . Ectopic gastric tissue   . Esophageal polyp   . Blood in stool 04/30/2018  . Arteriosclerosis of coronary artery 10/29/2015  . Diabetes mellitus, type 2 ([place]) 10/29/2015  . Calculus of kidney 10/29/2015  . Type 2 diabetes mellitus ([place]) 10/29/2015  . Acute gouty arthritis 04/08/2015  . H/O renal calculi 04/08/2015  . CA of prostate ([place]) 04/01/2015  . Malignant neoplasm of prostate ([place]) 04/01/2015  . Essential (primary) hypertension 09/30/2014  . Abnormal prostate specific antigen 09/10/2014  . Episodic tension type headache 09/10/2014  . Adiposity 09/10/2014  . Bilateral tinnitus 09/10/2014  . Elevated  prostate specific antigen (PSA) 09/10/2014  . 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 Cephalalgia 05/06/2014  . Buzzing in ear 05/06/2014  . 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 Arthropathia 04/04/2013  . Back ache 04/04/2013  . Hypercholesterolemia without hypertriglyceridemia 02/13/2012  . Pure hypercholesterolemia 02/13/2012    Past Surgical History:  Procedure Laterality Date  . CHOLECYSTECTOMY    . COLONOSCOPY N/A 05/01/2018   Procedure: COLONOSCOPY;  Surgeon: Virgel Manifold, MD;  Location: [facility] ENDOSCOPY;  Service: Endoscopy;  Laterality: N/A;  . ESOPHAGOGASTRODUODENOSCOPY N/A 05/01/2018   Procedure: ESOPHAGOGASTRODUODENOSCOPY (EGD);  Surgeon: Virgel Manifold, MD;  Location: [facility] ENDOSCOPY;  Service: Endoscopy;  Laterality: N/A;  . [place]    Prior to Admission medications   Medication Sig Start Date End Date Taking? Authorizing Provider  amLODipine (NORVASC) 5 MG tablet Take 5 mg by mouth daily.  04/11/15   [provider]  Ascorbic Acid (VITAMIN C) 1000 MG tablet Take 1,000 mg by mouth daily.    [provider]  aspirin EC 81 MG tablet Take 81 mg by mouth daily.    [provider]  atorvastatin (LIPITOR) 20 MG tablet Take 30 mg by mouth daily.    [provider]  Cholecalciferol (VITAMIN D3) 1000 UNITS CAPS Take 1 capsule by mouth daily.    [provider]  cloNIDine (CATAPRES) 0.1 MG tablet Take  0.1 mg by mouth 2 (two) times daily.  03/30/15   [provider]  colchicine 0.6 MG tablet Take 0.6 mg by mouth daily as needed (gout flare). Reported on 11/25/2015 04/08/15   [provider]  fenofibrate 160 MG tablet Take 160 mg by mouth daily.    [provider]  ferrous sulfate (IRON SUPPLEMENT) 325 (65 FE) MG tablet Take 325 mg by  mouth daily with breakfast.    [provider]  finasteride (PROSCAR) 5 MG tablet Take 1 tablet (5 mg total) by mouth daily. Patient not taking: Reported on 04/30/2018 01/21/18   Bjorn Loser, MD  glipiZIDE (GLUCOTROL XL) 5 MG 24 hr tablet Take 5 mg by mouth daily with breakfast.  03/22/15   [provider]  hydrochlorothiazide (HYDRODIURIL) 25 MG tablet Take 25 mg by mouth daily.  04/11/15   [provider]  metFORMIN (GLUCOPHAGE-XR) 500 MG 24 hr tablet Take 1,000 mg by mouth daily with supper.  05/14/16   [provider]  metoprolol (TOPROL-XL) 200 MG 24 hr tablet Take 200 mg by mouth daily.  04/11/15   [provider]  Omega-3 Fatty Acids (FISH OIL) 1000 MG CAPS Take 2,000 mg by mouth daily.    [provider]  pantoprazole (PROTONIX) 40 MG tablet Take 1 tablet (40 mg total) by mouth daily. 05/01/18   Hillary Bow, MD  potassium chloride SA (K-DUR,KLOR-CON) 20 MEQ tablet Take 20 mEq by mouth daily.  04/11/15   [provider]  ramipril (ALTACE) 10 MG capsule Take 10 mg by mouth daily.  03/30/15   [provider]  rOPINIRole (REQUIP) 0.25 MG tablet Take 0.5 mg by mouth at bedtime.  04/18/15   [provider]  tamsulosin (FLOMAX) 0.4 MG CAPS capsule Take 1 capsule (0.4 mg total) by mouth daily. Patient not taking: Reported on 12/31/2017 03/28/17   Nickie Retort, MD  vitamin B-12 (CYANOCOBALAMIN) 100 MCG tablet Take 100 mcg by mouth daily.    [provider]     Allergies Pravachol [pravastatin sodium]; Pravastatin; and Statins  No family history on file.  Social History Social History   Tobacco Use  . Smoking status: Former Smoker    Last attempt to quit: 09/15/1995    Years since quitting: 22.6  . Smokeless tobacco: Never Used  Substance Use Topics  . Alcohol use: No    Alcohol/week: 0.0 oz  . Drug use: No    Review of Systems  Constitutional: No fever/chills Eyes: No visual changes.  ENT:  No sore throat. Cardiovascular: Denies chest pain. Respiratory: Denies shortness of breath. Gastrointestinal: No abdominal pain.  No nausea, no vomiting.   Genitourinary: Negative for dysuria. Musculoskeletal: Negative for back pain. Skin: Negative for rash. Neurological: Negative for headaches or weakness   ____________________________________________   PHYSICAL EXAM:  VITAL SIGNS: ED Triage Vitals  Enc Vitals Group     BP 05/04/18 2124 (!) 182/91     Pulse Rate 05/04/18 2124 (!) 58     Resp 05/04/18 2124 14     Temp 05/04/18 2124 (!) 97.5 F (36.4 C)     Temp Source 05/04/18 2124 Oral     SpO2 05/04/18 2124 100 %     Weight 05/04/18 2127 117.9 kg (260 lb)     Height 05/04/18 2127 1.88 m (6\' 2" )     Head Circumference --      Peak Flow --      Pain Score 05/04/18 2127 0  Pain Loc --      Pain Edu? --      Excl. in [place]? --     Constitutional: Alert and oriented.  Ill-appearing Eyes: Conjunctivae are normal.  PERRLA, EOMI Head: Atraumatic. Nose: No congestion/rhinnorhea. Mouth/Throat: Mucous membranes are moist.   Neck:  Painless ROM Cardiovascular: Normal rate, regular rhythm. Grossly normal heart sounds.  Good peripheral circulation. Respiratory: Normal respiratory effort.  No retractions.  Gastrointestinal: Soft and nontender. No distention.    Musculoskeletal:  Warm and well perfused, normal strength in all extremities Neurologic:  Normal speech and language. No gross focal neurologic deficits are appreciated.  Cranial nerves II through XII are normal Skin:  Skin is warm, dry and intact. No rash noted. Psychiatric: Mood and affect are normal. Speech and behavior are normal.  ____________________________________________   LABS (all labs ordered are listed, but only abnormal results are displayed)  Labs Reviewed  CBC  COMPREHENSIVE METABOLIC PANEL  TROPONIN I  APTT  PROTIME-INR  TYPE AND SCREEN    ____________________________________________  EKG  ED ECG REPORT I, Lavonia Drafts, the attending physician, personally viewed and interpreted this ECG.  Date: 05/04/2018  Rhythm: Sinus rhythm, AV block QRS Axis: normal Intervals: Abnormal ST/T Wave abnormalities: Nonspecific Narrative Interpretation: no evidence of acute ischemia  ____________________________________________  RADIOLOGY  CT head pending ____________________________________________   PROCEDURES  Procedure(s) performed: No  Procedures   Critical Care performed: No ____________________________________________   INITIAL IMPRESSION / ASSESSMENT AND PLAN / ED COURSE  Pertinent labs & imaging results that were available during my care of the patient were reviewed by me and considered in my medical decision making (see chart for details).  Patient presents with acute onset dizziness/vertigo with nausea and vomiting, diaphoresis.  No chest pain or palpitations.  No shortness of breath.  Differential includes BPPV, CVA, gastritis, near-syncope  Will obtain CT head, give IV fluids, IV Zofran, meclizine, check labs including troponin and reevaluate  Have asked my partner to follow up on results and re-evaluate    ____________________________________________   FINAL CLINICAL IMPRESSION(S) / ED DIAGNOSES  Final diagnoses:  Vertigo        Note:  This document was prepared using Dragon voice recognition software and may include unintentional dictation errors.    Lavonia Drafts, MD 05/04/18 2234

## 2018-05-04 NOTE — ED Notes (Signed)
Patient transported to CT 

## 2018-05-05 DIAGNOSIS — R42 Dizziness and giddiness: Secondary | ICD-10-CM | POA: Diagnosis not present

## 2018-05-05 MED ORDER — ONDANSETRON HCL 4 MG/2ML IJ SOLN
4.0000 mg | Freq: Once | INTRAMUSCULAR | Status: AC
  Administered 2018-05-05: 4 mg via INTRAVENOUS
  Filled 2018-05-05: qty 2

## 2018-05-05 MED ORDER — ONDANSETRON 4 MG PO TBDP: 4.0000 mg | ORAL_TABLET | Freq: Three times a day (TID) | ORAL | 0 refills | Status: DC | PRN

## 2018-05-05 MED ORDER — ACETAMINOPHEN 325 MG PO TABS
ORAL_TABLET | ORAL | Status: AC
  Filled 2018-05-05: qty 2

## 2018-05-05 MED ORDER — MIDAZOLAM HCL 5 MG/5ML IJ SOLN
1.0000 mg | Freq: Once | INTRAMUSCULAR | Status: AC
  Administered 2018-05-05: 1 mg via INTRAVENOUS
  Filled 2018-05-05: qty 5

## 2018-05-05 MED ORDER — ACETAMINOPHEN 325 MG PO TABS
650.0000 mg | ORAL_TABLET | Freq: Once | ORAL | Status: AC
  Administered 2018-05-05: 650 mg via ORAL

## 2018-05-05 NOTE — ED Notes (Signed)
ED Provider at bedside. 

## 2018-05-05 NOTE — ED Provider Notes (Signed)
-----------------------------------------   1:23 AM on 05/05/2018 -----------------------------------------  Reassessed patient who reports he is "worlds better" than when he first came in.  However, states he is still somewhat dizzy, especially after he was assisted to the commode.  Will administer IV Versed with Zofran for further symptomatic relief, and reassess.   ----------------------------------------- 2:40 AM on 05/05/2018 -----------------------------------------  Feeling even better than before.  Ambulated to commode without difficulty.  States he used to take meclizine twice daily directed by his neurologist until his PCP said he could take it as needed.  Still has meclizine at home.  I have encouraged patient to take it at least to the weekend.  Will write prescription for ODT Zofran to use as needed.  Patient has no focal neurological deficits on exam.  Strict return precautions given.  Patient and spouse verbalize understanding and agree with plan of care.   Paulette Blanch, MD 05/05/18 [phone]

## 2018-05-05 NOTE — Discharge Instructions (Signed)
Take your meclizine twice daily through the weekend.  You may take Zofran as needed for nausea/vomiting.  Return to the ER for worsening symptoms, persistent vomiting, difficulty breathing or other concerns.

## 2018-05-05 NOTE — ED Notes (Signed)
Patient resting quietly with eyes closed

## 2018-05-08 LAB — SURGICAL PATHOLOGY

## 2018-05-10 ENCOUNTER — Other Ambulatory Visit: Payer: Self-pay | Admitting: Gastroenterology

## 2018-05-16 ENCOUNTER — Telehealth: Payer: Self-pay | Admitting: Gastroenterology

## 2018-05-16 MED ORDER — FLUCONAZOLE 200 MG PO TABS: ORAL_TABLET | ORAL | 0 refills | Status: DC

## 2018-05-16 NOTE — Telephone Encounter (Signed)
Rx was sent into [facility] in [place]. Pt's pharmacy was previously listed as [facility] in [place]. Wife notified.

## 2018-05-16 NOTE — Telephone Encounter (Signed)
Pt wife  Is calling because pt rx has not been send [facility] in [place] she did not know the name of the rx please call pt once sent

## 2018-05-24 LAB — GLUCOSE, CAPILLARY
Glucose-Capillary: 135 mg/dL — ABNORMAL HIGH (ref 65–99)
Glucose-Capillary: 143 mg/dL — ABNORMAL HIGH (ref 65–99)

## 2018-06-12 ENCOUNTER — Other Ambulatory Visit: Payer: Self-pay

## 2018-06-12 ENCOUNTER — Encounter: Payer: Self-pay | Admitting: Gastroenterology

## 2018-06-12 ENCOUNTER — Ambulatory Visit (INDEPENDENT_AMBULATORY_CARE_PROVIDER_SITE_OTHER): Payer: Medicare Other | Admitting: Gastroenterology

## 2018-06-12 VITALS — BP 124/69 | HR 71 | Ht 74.0 in | Wt 266.0 lb

## 2018-06-12 DIAGNOSIS — K627 Radiation proctitis: Secondary | ICD-10-CM | POA: Diagnosis not present

## 2018-06-12 DIAGNOSIS — K219 Gastro-esophageal reflux disease without esophagitis: Secondary | ICD-10-CM | POA: Diagnosis not present

## 2018-06-12 MED ORDER — PANTOPRAZOLE SODIUM 20 MG PO TBEC: DELAYED_RELEASE_TABLET | ORAL | 3 refills | Status: DC

## 2018-06-12 NOTE — Progress Notes (Signed)
Warren Antigua, MD [address]  [place]  [place], [place] [address]  Main: [phone]  Fax: [phone]   Primary Care Physician: [name], MD  Primary Gastroenterologist:  Dr. Vonda Stone  Chief Complaint  Patient presents with  . Follow-up    HPI: Warren Bertoni Donofrio Sr. is a 67 y.o. 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  No further hematochezia since hospital discharge.  No abdominal pain, weight loss, dysphagia, nausea or vomiting no altered bowel habits.  No family history of colon cancer.  Colonoscopy May 01, 2018, showed a fair prep, extent of exam cecum.  4 mm polyp removed and showed hyperplastic polyp.  Radiation proctitis treated with APC.  EGD also done at the same time, showed a single 5 mm mucosal nodule at the GE junction.  Biopsy was performed, and 2 hemostatic clips were placed at the site.  (Hemostatic clips were placed, as when the area was biopsied, the entire nodule was inadvertently removed during the biopsy itself and some oozing was.  The gross pathology notes to tissue fragment to be 0.5 cm in size, which is also consistent with entire nodule being removed.)  Ectopic gastric mucosa at 18 cm. PPI, and fluconazole were prescribed.  Fluconazole was due to Candida seen on GE junction nodule biopsies.  See pathology report below.  Pathology report is consistent with the GE junction nodule in granulation tissue, and reactive epithelial changes, thus benign  Patient also states, that he used to take Tums every day prior to being prescribed PPI in the hospital and on discharge.  He ran out of his PPI 2 days ago, and has had heartburn daily due to this.  States he was prescribed Zantac in the past, and took it for 2 weeks consistently, which did not help his heartburn.   Surgical Pathology  DIAGNOSIS:  A.  GASTROESOPHAGEAL JUNCTION NODULE; COLD FORCEPS BIOPSY:  - SQUAMOUS MUCOSA WITH AREA OF EROSION AND POLYPOID GRANULATION TISSUE.  - CANDIDIASIS OF ADJACENT HYPERPLASTIC SQUAMOUS EPITHELIUM.  - REACTIVE EPITHELIAL AND STROMAL CHANGES, SEE COMMENT.   Comment:  There are scattered enlarged stromal cells with hyperchromatic nuclei  within the inflamed granulation tissue. Immunohistochemistry (IHC) for  cytokeratins (AE1/AE3/PCK26) was performed to exclude carcinoma. The  subepithelial cells are negative for cytokeratin and represent reactive  stromal cells. The inflamed squamous epithelium also shows reactive  changes. There is no definite dysplasia. No intraepithelial eosinophils  are seen.   No diagnostic viral inclusions are identified, but for completeness,  immunohistochemistry for CMV will be performed, and the result will be  reported in an addendum.    B. RECTUM POLYP; COLD SNARE:  - 2 MM MUCOSAL FRAGMENT WITH CRYPT HYPERPLASIA, COMPATIBLE WITH EARLY  HYPERPLASTIC POLYP.  - NEGATIVE FOR DYSPLASIA AND MALIGNANCY.   IHC slides were prepared by Launa Grill, [place]. All controls stained  appropriately.        Current Outpatient Medications  Medication Sig Dispense Refill  . amLODipine (NORVASC) 5 MG tablet Take 5 mg by mouth daily.   0  . Ascorbic Acid (VITAMIN C) 1000 MG tablet Take 1,000 mg by mouth daily.    [name] aspirin EC 81 MG tablet Take 81 mg by mouth daily.    [name] atorvastatin (LIPITOR) 20 MG tablet Take 30 mg by mouth daily.  1  . Cholecalciferol (VITAMIN D3) 1000 UNITS CAPS Take 1 capsule by mouth  daily.    . cloNIDine (CATAPRES) 0.1 MG tablet Take 0.1 mg by mouth 2 (two) times daily.   0  . colchicine 0.6 MG tablet Take 0.6 mg by mouth daily as needed (gout flare). Reported on 11/25/2015  0  . fenofibrate 160 MG tablet Take 160 mg by mouth daily.    . finasteride (PROSCAR) 5 MG tablet Take 1 tablet (5 mg total) by mouth daily. 30 tablet 11  . glipiZIDE (GLUCOTROL XL)  5 MG 24 hr tablet Take 5 mg by mouth daily with breakfast.   0  . hydrochlorothiazide (HYDRODIURIL) 25 MG tablet Take 25 mg by mouth daily.   0  . metFORMIN (GLUCOPHAGE-XR) 500 MG 24 hr tablet Take 1,000 mg by mouth daily with supper.   1  . metoprolol (TOPROL-XL) 200 MG 24 hr tablet Take 200 mg by mouth daily.   0  . Omega-3 Fatty Acids (FISH OIL) 1000 MG CAPS Take 2,000 mg by mouth daily.    . potassium chloride SA (K-DUR,KLOR-CON) 20 MEQ tablet Take 20 mEq by mouth daily.   0  . ramipril (ALTACE) 10 MG capsule Take 10 mg by mouth daily.   0  . rOPINIRole (REQUIP) 0.25 MG tablet Take 0.5 mg by mouth at bedtime.   0  . vitamin B-12 (CYANOCOBALAMIN) 100 MCG tablet Take 100 mcg by mouth daily.    . ferrous sulfate (IRON SUPPLEMENT) 325 (65 FE) MG tablet Take 325 mg by mouth daily with breakfast.    . fluconazole (DIFLUCAN) 200 MG tablet Take 2 tabs. (400mg ) on day 1,  then 200 mg 1 tab daily, x 14 days (Patient not taking: Reported on 06/12/2018) 15 tablet 0  . ondansetron (ZOFRAN ODT) 4 MG disintegrating tablet Take 1 tablet (4 mg total) by mouth every 8 (eight) hours as needed for nausea or vomiting. (Patient not taking: Reported on 06/12/2018) 20 tablet 0  . tamsulosin (FLOMAX) 0.4 MG CAPS capsule Take 1 capsule (0.4 mg total) by mouth daily. (Patient not taking: Reported on 12/31/2017) 30 capsule 12   No current facility-administered medications for this visit.     Allergies as of 06/12/2018 - Review Complete 06/12/2018  Allergen Reaction Noted  . Pravachol [pravastatin sodium]  05/04/2018  . Pravastatin Nausea And Vomiting 08/26/2015  . Statins Rash 04/21/2015    ROS:  General: Negative for anorexia, weight loss, fever, chills, fatigue, weakness. ENT: Negative for hoarseness, difficulty swallowing , nasal congestion. CV: Negative for chest pain, angina, palpitations, dyspnea on exertion, peripheral edema.  Respiratory: Negative for dyspnea at rest, dyspnea on exertion, cough, sputum,  wheezing.  GI: See history of present illness. GU:  Negative for dysuria, hematuria, urinary incontinence, urinary frequency, nocturnal urination.  Endo: Negative for unusual weight change.    Physical Examination:   BP 124/69   Pulse 71   Ht 6\' 2"  (1.88 m)   Wt 266 lb (120.7 kg)   BMI 34.15 kg/m   General: Well-nourished, well-developed in no acute distress.  Eyes: No icterus. Conjunctivae pink. Mouth: Oropharyngeal mucosa moist and pink , no lesions erythema or exudate. Neck: Supple, Trachea midline Abdomen: Bowel sounds are normal, nontender, nondistended, no hepatosplenomegaly or masses, no abdominal bruits or hernia , no rebound or guarding.   Extremities: No lower extremity edema. No clubbing or deformities. Neuro: Alert and oriented x 3.  Grossly intact. Skin: Warm and dry, no jaundice.   Psych: Alert and cooperative, normal mood and affect.   Labs: CMP  Component Value Date/Time   NA 136 05/04/2018 2211   NA 138 03/14/2014 0802   K 3.5 05/04/2018 2211   K 3.9 03/14/2014 0802   CL 100 (L) 05/04/2018 2211   CL 103 03/14/2014 0802   CO2 27 05/04/2018 2211   CO2 27 03/14/2014 0802   GLUCOSE 197 (H) 05/04/2018 2211   GLUCOSE 195 (H) 03/14/2014 0802   BUN 13 05/04/2018 2211   BUN 15 03/14/2014 0802   CREATININE 1.07 05/04/2018 2211   CREATININE 1.15 03/14/2014 0802   CALCIUM 9.1 05/04/2018 2211   CALCIUM 8.6 03/14/2014 0802   PROT 7.3 05/04/2018 2211   PROT 6.7 03/14/2014 0802   ALBUMIN 3.9 05/04/2018 2211   ALBUMIN 3.5 03/14/2014 0802   AST 23 05/04/2018 2211   AST 18 03/14/2014 0802   ALT 17 05/04/2018 2211   ALT 27 03/14/2014 0802   ALKPHOS 41 05/04/2018 2211   ALKPHOS 101 03/14/2014 0802   BILITOT 0.5 05/04/2018 2211   BILITOT 0.4 03/14/2014 0802   GFRNONAA >60 05/04/2018 2211   GFRNONAA >60 03/14/2014 0802   GFRAA >60 05/04/2018 2211   GFRAA >60 03/14/2014 0802   Lab Results  Component Value Date   WBC 8.8 05/04/2018   HGB 13.4 05/04/2018    HCT 38.7 (L) 05/04/2018   MCV 80.7 05/04/2018   PLT 131 (L) 05/04/2018    Imaging Studies: No results found.  Assessment and Plan:   Warren Cumpston Card Sr. is a 67 y.o. y/o male here for posthospitalization follow-up for hematochezia, found to be due to radiation proctitis and treated with APC in May 2019  No further hematochezia Maintain soft stool High-fiber diet Denies constipation at this time  No prior colonoscopies prior to the May 2019 colonoscopy. The colonoscopy was not considered a good exam for screening given his fair prep Therefore, screening colonoscopy was discussed with the patient.  I have recommended that he get this done soon, and he verbalized understanding.  He does not want to schedule it at this time, given that he has an upcoming appointment with his primary care provider, urologist, and oncologist.  He will call us when he is ready to schedule.  We will set a follow-up clinic appointment and set recall in his chart for the same.  At the time of his colonoscopy, we will also repeat EGD, to follow-up on the GE junction nodule He is agreeable with this plan Patient educated extensively on acid reflux lifestyle modification, including buying a bed wedge, not eating 3 hrs before bedtime, diet modifications, and handout given for the same.   Since he has return of heartburn significantly, since he ran out of his PPI, will refill his PPI prescription I have refilled it as Protonix 20 mg pills, and asked him to take 2 pills a day for 2 weeks, and then decrease it to 1 pill a day, to see if the lower dose helps his symptoms.  If not, we can increase the dose back to 40 mg daily (Risks of PPI use were discussed with patient including bone loss, C. Diff diarrhea, pneumonia, infections, CKD, electrolyte abnormalities.  If clinically possible based on symptoms, goal would be to maintain patient on the lowest dose possible, or discontinue the medication with institution of  acid reflux lifestyle modifications over time. Pt. Verbalizes understanding and chooses to continue the medication.)   Dr Warren Stone

## 2018-08-01 ENCOUNTER — Inpatient Hospital Stay: Payer: Medicare Other | Attending: Radiation Oncology

## 2018-08-01 DIAGNOSIS — C61 Malignant neoplasm of prostate: Secondary | ICD-10-CM | POA: Insufficient documentation

## 2018-08-01 LAB — PSA: PROSTATIC SPECIFIC ANTIGEN: 0.23 ng/mL (ref 0.00–4.00)

## 2018-08-08 ENCOUNTER — Other Ambulatory Visit: Payer: Self-pay | Admitting: *Deleted

## 2018-08-08 ENCOUNTER — Other Ambulatory Visit: Payer: Self-pay

## 2018-08-08 ENCOUNTER — Ambulatory Visit
Admission: RE | Admit: 2018-08-08 | Discharge: 2018-08-08 | Disposition: A | Discharge status: Home / Self Care | Payer: Medicare Other | Source: Ambulatory Visit | Attending: Radiation Oncology | Admitting: Radiation Oncology

## 2018-08-08 ENCOUNTER — Encounter: Payer: Self-pay | Admitting: Radiation Oncology

## 2018-08-08 VITALS — BP 135/62 | HR 55 | Temp 96.3°F | Resp 16 | Wt 267.6 lb

## 2018-08-08 DIAGNOSIS — C61 Malignant neoplasm of prostate: Secondary | ICD-10-CM | POA: Diagnosis not present

## 2018-08-08 DIAGNOSIS — Z923 Personal history of irradiation: Secondary | ICD-10-CM | POA: Insufficient documentation

## 2018-08-08 NOTE — Progress Notes (Signed)
Radiation Oncology Follow up Note  Name: Warren Fabiano Lemarr Sr.   Date:   08/08/2018 MRN:  [id number] DOB: 10/19/1951    This 67 y.o. male presents to the clinic today for 3 year follow-up status post I MRT radiation therapy for Gleason 7 adenocarcinoma the prostate.  REFERRING PROVIDER: [name], MD  HPI: patient is a 66 year old male now out 3 years having completed IM RT radiation therapy to his prostate for Gleason 7 (3+4) adenocarcinoma presenting the PSA of 6. He is seen today in routine follow-up and is doing well. He specifically denies any increased lower urinary tract symptoms or diarrhea. His most recent PSA is 9.16.  COMPLICATIONS OF TREATMENT: none  FOLLOW UP COMPLIANCE: keeps appointments   PHYSICAL EXAM:  BP 135/62 (BP Location: Left Arm, Patient Position: Sitting)   Pulse (!) 55   Temp (!) 96.3 F (35.7 C) (Tympanic)   Resp 16   Wt 267 lb 10.2 oz (121.4 kg)   BMI 34.36 kg/m  Well-developed well-nourished patient in NAD. HEENT reveals PERLA, EOMI, discs not visualized.  Oral cavity is clear. No oral mucosal lesions are identified. Neck is clear without evidence of cervical or supraclavicular adenopathy. Lungs are clear to A&P. Cardiac examination is essentially unremarkable with regular rate and rhythm without murmur rub or thrill. Abdomen is benign with no organomegaly or masses noted. Motor sensory and DTR levels are equal and symmetric in the upper and lower extremities. Cranial nerves II through XII are grossly intact. Proprioception is intact. No peripheral adenopathy or edema is identified. No motor or sensory levels are noted. Crude visual fields are within normal range.  RADIOLOGY RESULTS: CT head reviewed which was ordered for the dizziness showing no acute intracranial pathology  PLAN: present time patient is doing well under good biochemical control of his prostate cancer. I'm please was overall progress. I've asked to see him back in 1 year for  follow-up. Patient knows to call with any concerns at any time.  I would like to take this opportunity to thank you for allowing me to participate in the care of your patient.Noreene Filbert, MD

## 2018-09-09 ENCOUNTER — Other Ambulatory Visit: Payer: Self-pay | Admitting: Gastroenterology

## 2018-09-13 ENCOUNTER — Other Ambulatory Visit: Payer: Self-pay | Admitting: Otolaryngology

## 2018-09-13 DIAGNOSIS — H9041 Sensorineural hearing loss, unilateral, right ear, with unrestricted hearing on the contralateral side: Secondary | ICD-10-CM

## 2018-09-26 ENCOUNTER — Ambulatory Visit
Admission: RE | Admit: 2018-09-26 | Discharge: 2018-09-26 | Disposition: A | Discharge status: Home / Self Care | Payer: Medicare Other | Source: Ambulatory Visit | Attending: Otolaryngology | Admitting: Otolaryngology

## 2018-09-26 DIAGNOSIS — I6782 Cerebral ischemia: Secondary | ICD-10-CM | POA: Insufficient documentation

## 2018-09-26 DIAGNOSIS — H9041 Sensorineural hearing loss, unilateral, right ear, with unrestricted hearing on the contralateral side: Secondary | ICD-10-CM

## 2018-09-26 DIAGNOSIS — R9082 White matter disease, unspecified: Secondary | ICD-10-CM | POA: Insufficient documentation

## 2018-09-26 MED ORDER — GADOBUTROL 1 MMOL/ML IV SOLN
10.0000 mL | Freq: Once | INTRAVENOUS | Status: AC | PRN
  Administered 2018-09-26: 10 mL via INTRAVENOUS

## 2018-12-26 ENCOUNTER — Other Ambulatory Visit: Payer: Medicare Other

## 2018-12-26 DIAGNOSIS — C61 Malignant neoplasm of prostate: Secondary | ICD-10-CM

## 2018-12-27 LAB — PSA: Prostate Specific Ag, Serum: 0.2 ng/mL (ref 0.0–4.0)

## 2019-01-01 ENCOUNTER — Ambulatory Visit: Payer: Medicare Other | Admitting: Urology

## 2019-01-02 ENCOUNTER — Encounter: Payer: Self-pay | Admitting: Urology

## 2019-01-02 ENCOUNTER — Ambulatory Visit (INDEPENDENT_AMBULATORY_CARE_PROVIDER_SITE_OTHER): Payer: Medicare Other | Admitting: Urology

## 2019-01-02 VITALS — BP 143/79 | HR 69 | Ht 74.0 in | Wt 260.0 lb

## 2019-01-02 DIAGNOSIS — C61 Malignant neoplasm of prostate: Secondary | ICD-10-CM

## 2019-01-02 NOTE — Progress Notes (Signed)
01/02/2019 2:27 PM   Warren Cera Morey Sr. 25-Mar-1951 [id number]  Referring provider: [name], MD [address] [place], [place] [address]  Chief Complaint  Patient presents with  . Benign Prostatic Hypertrophy    1 yr follow up   Urologic history: 1.  Gleason 3+3/3+4 adenocarcinoma diagnosed 2016  -Treated IMRT completed August 2016   2.  Nephrolithiasis  -Nonobstructing left lower pole calculus   HPI: 68 year old male presents for annual follow-up.  His PSA has remained low and stable.  He saw Dr. [name] in August 2019.  He has no bothersome lower urinary tract symptoms.  Denies dysuria or gross hematuria.  Denies flank, abdominal, pelvic or scrotal pain.  PSA drawn earlier this week was 0.2   PMH: Past Medical History:  Diagnosis Date  . Arteriosclerosis of coronary artery 10/29/2015   Overview:  with mi   . Arthritis   . BPH (benign prostatic hyperplasia)   . Diabetes mellitus without complication ([place])   . Gout   . Heart attack ([place]) 1996  . Hypertension   . Prostate cancer ([place])   . Stroke [facility])     Surgical History: Past Surgical History:  Procedure Laterality Date  . CHOLECYSTECTOMY    . COLONOSCOPY N/A 05/01/2018   Procedure: COLONOSCOPY;  Surgeon: Virgel Manifold, MD;  Location: [facility] ENDOSCOPY;  Service: Endoscopy;  Laterality: N/A;  . ESOPHAGOGASTRODUODENOSCOPY N/A 05/01/2018   Procedure: ESOPHAGOGASTRODUODENOSCOPY (EGD);  Surgeon: Virgel Manifold, MD;  [facility]COPY;  Service: Endoscopy;  Laterality: N/A;  . RIGHT HEART CATHETERIZATION WITH ADENOSINE STUDY  1996    Home Medications:  Allergies as of 01/02/2019      Reactions   Pravachol [pravastatin Sodium]    Rash   Pravastatin Nausea And Vomiting   Other reaction(s): Unknown   Statins Rash   Pt reports only pravastatin causes rash - takes atorvastatin currently      Medication List       Accurate as of January 02, 2019  2:27 PM. Always use your most  recent med list.        amLODipine 5 MG tablet Commonly known as:  NORVASC Take 5 mg by mouth daily.   aspirin EC 81 MG tablet Take 81 mg by mouth daily.   atorvastatin 20 MG tablet Commonly known as:  LIPITOR Take 30 mg by mouth daily.   cloNIDine 0.1 MG tablet Commonly known as:  CATAPRES Take 0.1 mg by mouth 2 (two) times daily.   colchicine 0.6 MG tablet Take 0.6 mg by mouth daily as needed (gout flare). Reported on 11/25/2015   fenofibrate 160 MG tablet Take 160 mg by mouth daily.   Fish Oil 1000 MG Caps Take 2,000 mg by mouth daily.   glipiZIDE 5 MG 24 hr tablet Commonly known as:  GLUCOTROL XL Take 5 mg by mouth daily with breakfast.   hydrochlorothiazide 25 MG tablet Commonly known as:  HYDRODIURIL Take 25 mg by mouth daily.   metFORMIN 500 MG 24 hr tablet Commonly known as:  GLUCOPHAGE-XR Take 1,000 mg by mouth daily with supper.   metoprolol 200 MG 24 hr tablet Commonly known as:  TOPROL-XL Take 200 mg by mouth daily.   ondansetron 4 MG disintegrating tablet Commonly known as:  ZOFRAN ODT Take 1 tablet (4 mg total) by mouth every 8 (eight) hours as needed for nausea or vomiting.   pantoprazole 20 MG tablet Commonly known as:  PROTONIX Take 1 tablet (20 mg total) by mouth  daily.   potassium chloride SA 20 MEQ tablet Commonly known as:  K-DUR,KLOR-CON Take 20 mEq by mouth daily.   ramipril 10 MG capsule Commonly known as:  ALTACE Take 10 mg by mouth daily.   rOPINIRole 0.25 MG tablet Commonly known as:  REQUIP Take 0.5 mg by mouth at bedtime.   vitamin B-12 100 MCG tablet Commonly known as:  CYANOCOBALAMIN Take 100 mcg by mouth daily.   vitamin C 1000 MG tablet Take 1,000 mg by mouth daily.   Vitamin D3 25 MCG (1000 UT) Caps Take 1 capsule by mouth daily.       Allergies:  Allergies  Allergen Reactions  . Pravachol [Pravastatin Sodium]     Rash  . Pravastatin Nausea And Vomiting    Other reaction(s): Unknown  . Statins Rash      Pt reports only pravastatin causes rash - takes atorvastatin currently    Family History: No family history on file.  Social History:  reports that he quit smoking about 23 years ago. He has never used smokeless tobacco. He reports that he does not drink alcohol or use drugs.  ROS: UROLOGY Frequent Urination?: No Hard to postpone urination?: No Burning/pain with urination?: No Get up at night to urinate?: No Leakage of urine?: No Urine stream starts and stops?: No Trouble starting stream?: No Do you have to strain to urinate?: No Blood in urine?: No Urinary tract infection?: No Sexually transmitted disease?: No Injury to kidneys or bladder?: No Painful intercourse?: No Weak stream?: No Erection problems?: No Penile pain?: No  Gastrointestinal Nausea?: No Vomiting?: No Indigestion/heartburn?: No Diarrhea?: No Constipation?: No  Constitutional Fever: No Night sweats?: No Weight loss?: No Fatigue?: No  Skin Skin rash/lesions?: No Itching?: Yes  Eyes Blurred vision?: Yes Double vision?: No  Ears/Nose/Throat Sore throat?: No Sinus problems?: No  Hematologic/Lymphatic Swollen glands?: No Easy bruising?: No  Cardiovascular Leg swelling?: No Chest pain?: No  Respiratory Cough?: No Shortness of breath?: No  Endocrine Excessive thirst?: No  Musculoskeletal Back pain?: Yes Joint pain?: No  Neurological Headaches?: Yes Dizziness?: Yes(occasionally)  Psychologic Depression?: No Anxiety?: No  Physical Exam: BP (!) 143/79 (BP Location: Left Arm, Patient Position: Sitting)   Pulse 69   Ht 6\' 2"  (1.88 m)   Wt 260 lb (117.9 kg)   BMI 33.38 kg/m   Constitutional:  Alert and oriented, No acute distress. HEENT: [place] AT, moist mucus membranes.  Trachea midline, no masses. Cardiovascular: No clubbing, cyanosis, or edema. Respiratory: Normal respiratory effort, no increased work of breathing. Neurologic: Grossly intact, no focal deficits, moving all  4 extremities. Psychiatric: Normal mood and affect.   Assessment & Plan:   68 year old male with intermediate risk prostate cancer status post IMRT.  PSA is stable.  He has a follow-up in radiation oncology in 6 months and we will see him back in 1 year.   Abbie Sons, [place] [address] [place], [place] [address] [phone]

## 2019-01-08 ENCOUNTER — Other Ambulatory Visit: Payer: Self-pay | Admitting: Gastroenterology

## 2019-04-07 ENCOUNTER — Other Ambulatory Visit: Payer: Self-pay

## 2019-04-07 ENCOUNTER — Telehealth: Payer: Self-pay | Admitting: Gastroenterology

## 2019-04-07 MED ORDER — PANTOPRAZOLE SODIUM 20 MG PO TBEC: 20.0000 mg | DELAYED_RELEASE_TABLET | Freq: Every day | ORAL | 0 refills | Status: AC

## 2019-04-07 NOTE — Telephone Encounter (Signed)
Received a fax from [facility] fro rx PANTOPRAZOLE 20 MG TABLETS QTY 90

## 2019-04-07 NOTE — Telephone Encounter (Signed)
Received fax from [facility] in [place] for 90 day prescription Request for pantoprazole (PROTONIX) 20 MG tablet Original Quan:30 Quan:requested 90.  Fax has been placed on [employer]

## 2019-06-30 ENCOUNTER — Encounter: Payer: Self-pay | Admitting: *Deleted

## 2019-07-06 IMAGING — CT CT HEAD W/O CM
3 series · 16 of 47 positions shown, 19 images · non-contrast
Comparison: 03/14/2014

CLINICAL DATA: Ataxia, dizziness for 1 hour

EXAM:
CT HEAD WITHOUT CONTRAST
TECHNIQUE: Contiguous axial images were obtained from the base of the skull
through the vertex without intravenous contrast.

[Series 3: head wo · axial · 0.45mm/px · z∈[+780,+915]mm · 10 of 33 slices shown, 13 images]
[im 3/33  brain]
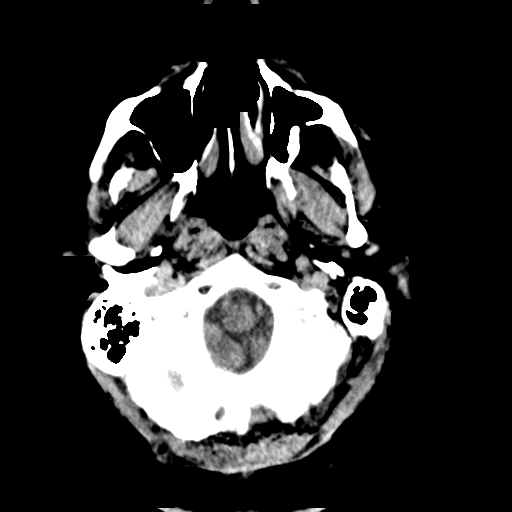
[im 3/33  bone]
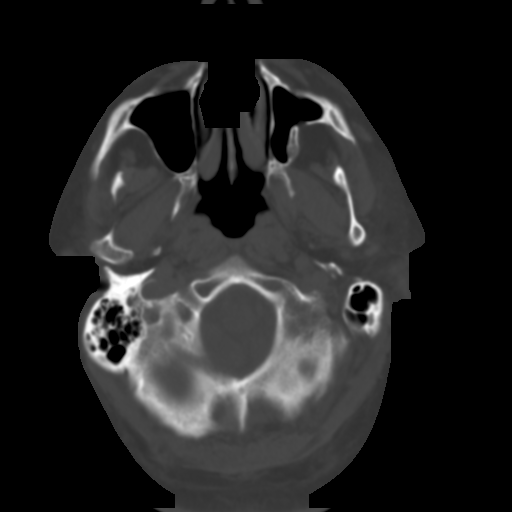
[im 6/33  brain]
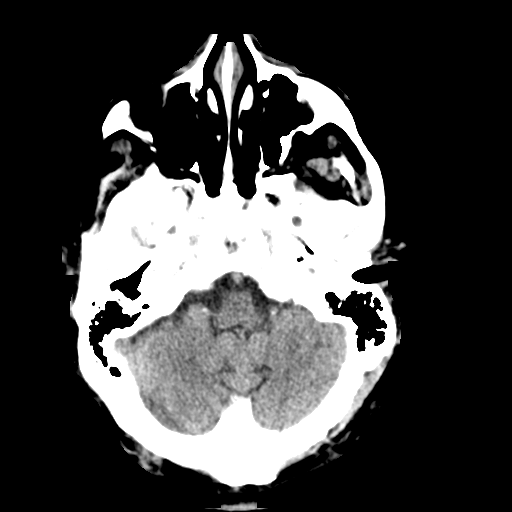
[im 9/33  brain]
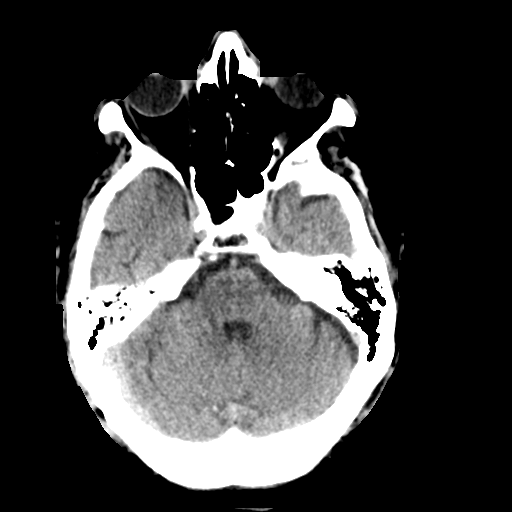
[im 12/33  brain]
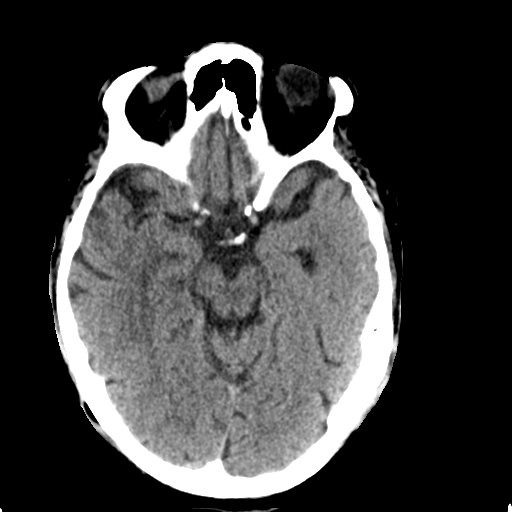
[im 15/33  brain]
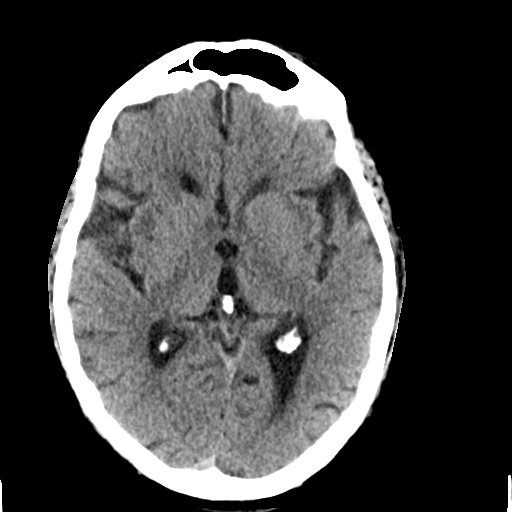
[im 15/33  bone]
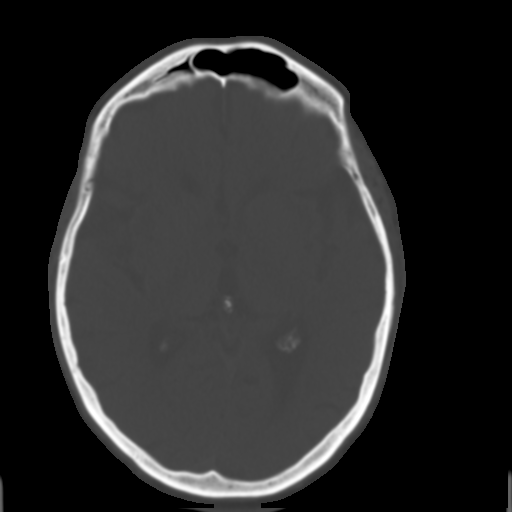
[im 18/33  brain]
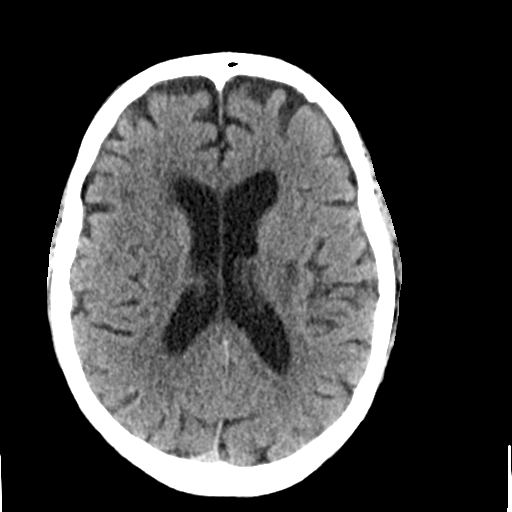
[im 21/33  brain]
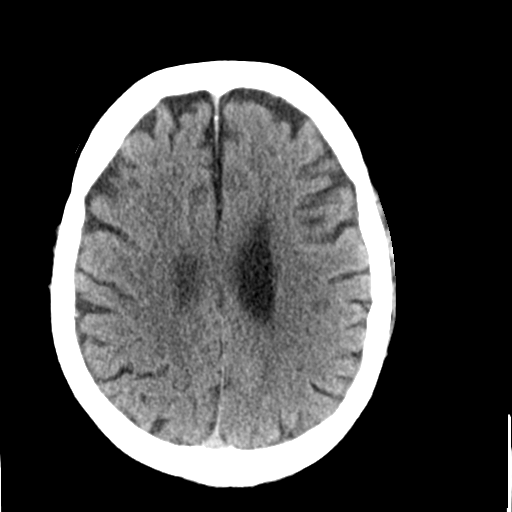
[im 25/33  brain]
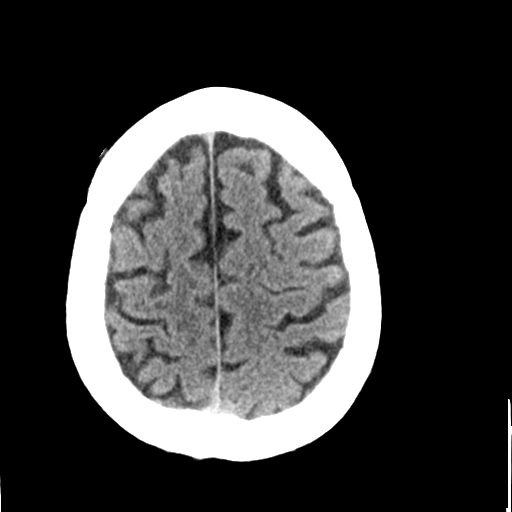
[im 27/33  brain]
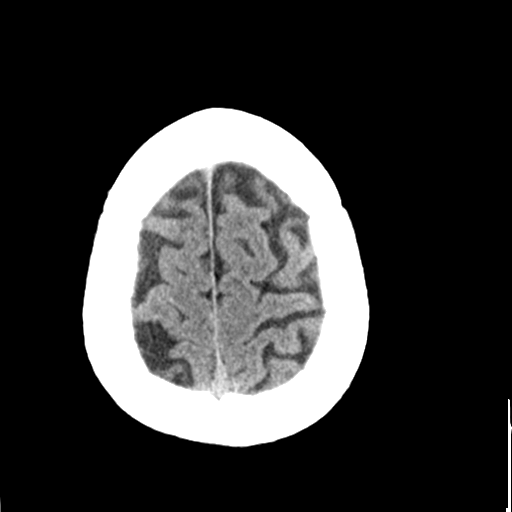
[im 27/33  bone]
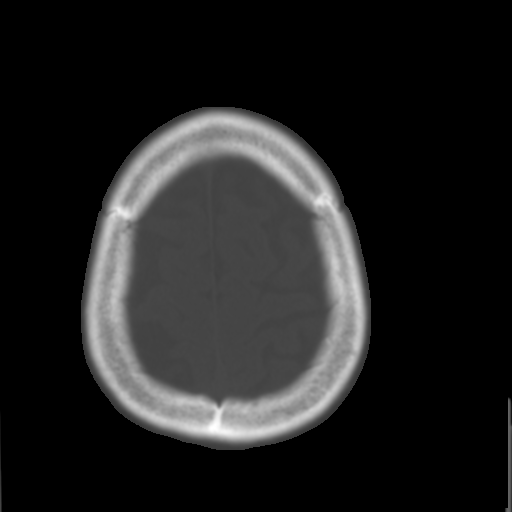
[im 30/33  brain]
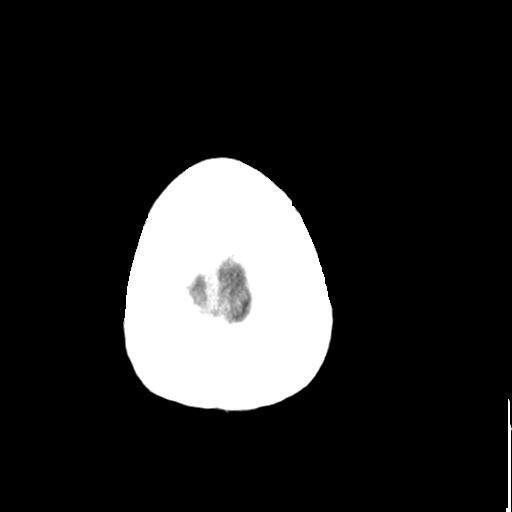

[Series 4: coronal soft tissue · coronal · 0.33mm/px · 3 of 74 slices shown]
[im 25/74  brain]
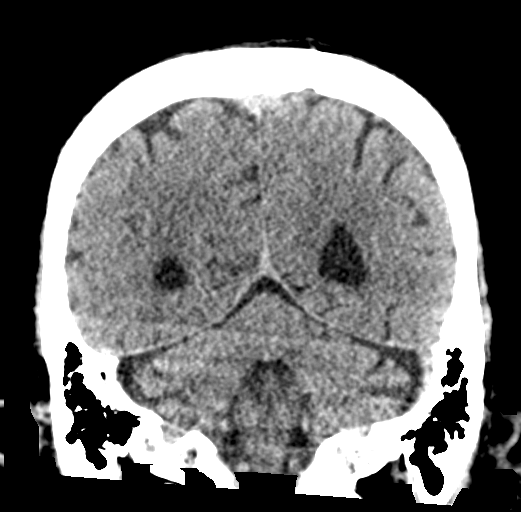
[im 33/74  brain]
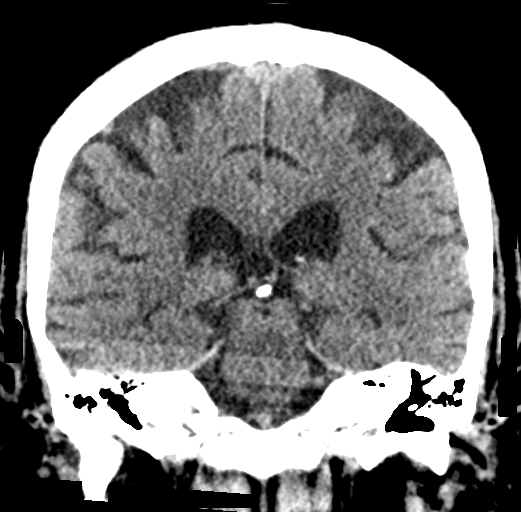
[im 41/74  brain]
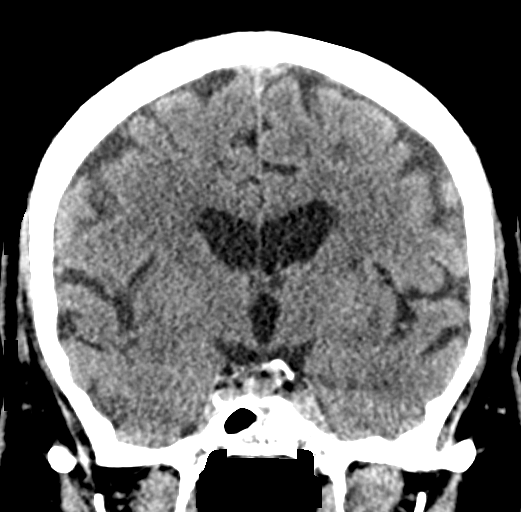

[Series 5: sagittal soft tissue · sagittal · 0.33mm/px · 3 of 57 slices shown]
[im 19/57  brain]
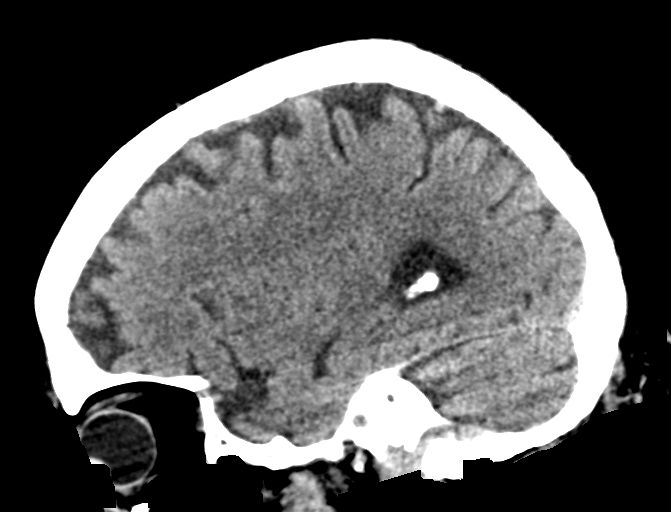
[im 29/57  brain]
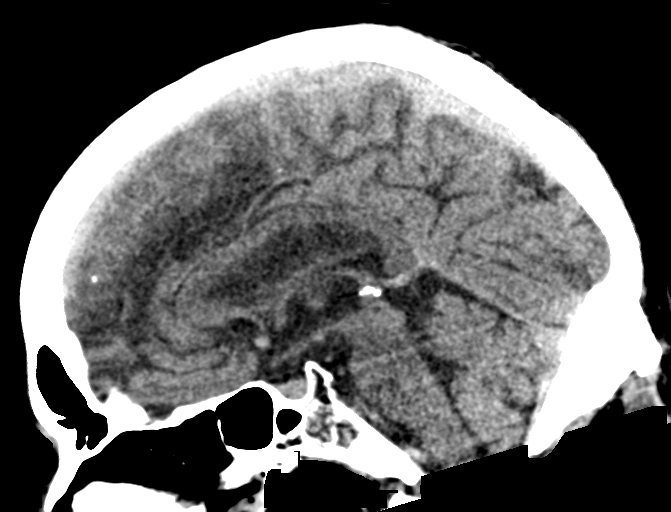
[im 38/57  brain]
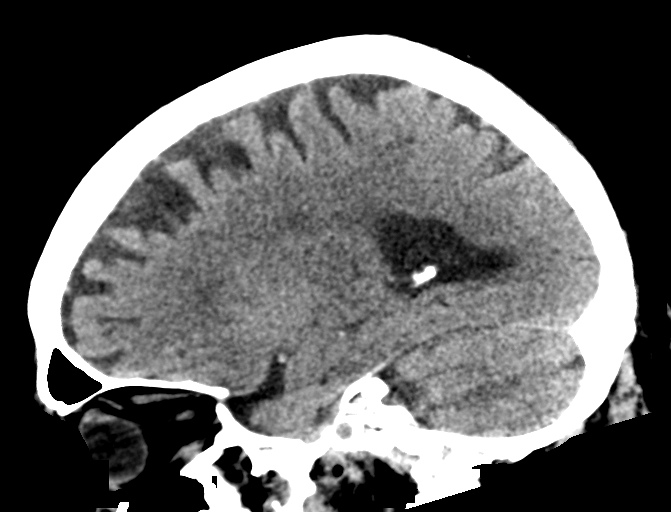

[16 of 47 positions shown; findings below may reference images not displayed]

FINDINGS: Brain: No evidence of acute infarction, hemorrhage, extra-axial
collection, ventriculomegaly, or mass effect. Small chronic left
subinsular lacunar infarct. Generalized cerebral atrophy.
Periventricular white matter low attenuation likely secondary to
microangiopathy.

Vascular: Cerebrovascular atherosclerotic calcifications are noted.

Skull: Negative for fracture or focal lesion.

Sinuses/Orbits: Visualized portions of the orbits are unremarkable.
Visualized portions of the paranasal sinuses and mastoid air cells
are unremarkable.

Other: None.
IMPRESSION: No acute intracranial pathology.

## 2019-08-07 ENCOUNTER — Inpatient Hospital Stay: Payer: Medicare Other | Attending: Radiation Oncology

## 2019-08-07 ENCOUNTER — Other Ambulatory Visit: Payer: Self-pay

## 2019-08-07 DIAGNOSIS — C61 Malignant neoplasm of prostate: Secondary | ICD-10-CM | POA: Insufficient documentation

## 2019-08-07 LAB — PSA: Prostatic Specific Antigen: 0.19 ng/mL (ref 0.00–4.00)

## 2019-08-14 ENCOUNTER — Ambulatory Visit: Payer: Medicare Other | Admitting: Radiation Oncology

## 2019-08-19 ENCOUNTER — Encounter: Payer: Self-pay | Admitting: Radiation Oncology

## 2019-08-19 ENCOUNTER — Other Ambulatory Visit: Payer: Self-pay | Admitting: *Deleted

## 2019-08-19 ENCOUNTER — Ambulatory Visit
Admission: RE | Admit: 2019-08-19 | Discharge: 2019-08-19 | Disposition: A | Discharge status: Home / Self Care | Payer: Medicare Other | Source: Ambulatory Visit | Attending: Radiation Oncology | Admitting: Radiation Oncology

## 2019-08-19 ENCOUNTER — Other Ambulatory Visit: Payer: Self-pay

## 2019-08-19 VITALS — BP 128/63 | HR 64 | Temp 94.7°F | Resp 16 | Wt 259.0 lb

## 2019-08-19 DIAGNOSIS — Z923 Personal history of irradiation: Secondary | ICD-10-CM | POA: Insufficient documentation

## 2019-08-19 DIAGNOSIS — C61 Malignant neoplasm of prostate: Secondary | ICD-10-CM

## 2019-08-19 NOTE — Progress Notes (Signed)
Radiation Oncology Follow up Note  Name: Warren Mcfaul Cartier Sr.   Date:   08/19/2019 MRN:  [id number] DOB: 1951/02/08    This 68 y.o. male presents to the clinic today for 4-year follow-up status post IMRT radiation therapy for Gleason 7 adenocarcinoma the prostate.  REFERRING PROVIDER: [name], MD  HPI: Patient is a 68 year old male now about 4 years having pleated IMRT radiation therapy to his prostate for a Gleason 7 (3+4) adenocarcinoma the prostate presenting with a PSA of 6.  Seen today in routine follow-up he is doing well.  He specifically denies diarrhea fatigue or any increased lower urinary tract symptoms..  COMPLICATIONS OF TREATMENT: none  FOLLOW UP COMPLIANCE: keeps appointments   PHYSICAL EXAM:  BP 128/63 (BP Location: Left Arm, Patient Position: Sitting)   Pulse 64   Temp (!) 94.7 F (34.8 C) (Tympanic)   Resp 16   Wt 259 lb (117.5 kg)   BMI 33.25 kg/m  Well-developed well-nourished patient in NAD. HEENT reveals PERLA, EOMI, discs not visualized.  Oral cavity is clear. No oral mucosal lesions are identified. Neck is clear without evidence of cervical or supraclavicular adenopathy. Lungs are clear to A&P. Cardiac examination is essentially unremarkable with regular rate and rhythm without murmur rub or thrill. Abdomen is benign with no organomegaly or masses noted. Motor sensory and DTR levels are equal and symmetric in the upper and lower extremities. Cranial nerves II through XII are grossly intact. Proprioception is intact. No peripheral adenopathy or edema is identified. No motor or sensory levels are noted. Crude visual fields are within normal range.  RADIOLOGY RESULTS: No current films to review  PLAN: Present time patient is doing well under excellent biochemical control of his prostate cancer.  I will see him back one more time at 5 years and then discontinue follow-up care should his PSA remain stable.  Patient comprehends my treatment plan well.   Continues to do extremely well.  I would like to take this opportunity to thank you for allowing me to participate in the care of your patient.Noreene Filbert, MD

## 2020-01-01 ENCOUNTER — Other Ambulatory Visit: Payer: Self-pay

## 2020-01-01 DIAGNOSIS — C61 Malignant neoplasm of prostate: Secondary | ICD-10-CM

## 2020-01-02 ENCOUNTER — Other Ambulatory Visit: Payer: Self-pay

## 2020-01-02 ENCOUNTER — Other Ambulatory Visit: Payer: Medicare Other

## 2020-01-02 DIAGNOSIS — C61 Malignant neoplasm of prostate: Secondary | ICD-10-CM

## 2020-01-03 LAB — PSA: Prostate Specific Ag, Serum: 0.3 ng/mL (ref 0.0–4.0)

## 2020-01-05 ENCOUNTER — Encounter: Payer: Self-pay | Admitting: Urology

## 2020-01-05 ENCOUNTER — Other Ambulatory Visit: Payer: Self-pay

## 2020-01-05 ENCOUNTER — Ambulatory Visit (INDEPENDENT_AMBULATORY_CARE_PROVIDER_SITE_OTHER): Payer: Medicare Other | Admitting: Urology

## 2020-01-05 VITALS — BP 165/78 | HR 68 | Ht 73.0 in | Wt 253.0 lb

## 2020-01-05 DIAGNOSIS — C61 Malignant neoplasm of prostate: Secondary | ICD-10-CM

## 2020-01-05 DIAGNOSIS — Z87442 Personal history of urinary calculi: Secondary | ICD-10-CM

## 2020-01-05 DIAGNOSIS — N401 Enlarged prostate with lower urinary tract symptoms: Secondary | ICD-10-CM | POA: Diagnosis not present

## 2020-01-05 NOTE — Progress Notes (Signed)
01/05/2020 2:04 PM   Warren Cera Pfiester Sr. 1951/08/01 [id number]  Referring provider: [name], MD [address] [place],  [place] [address]  Chief Complaint  Patient presents with  . Follow-up    Urologic history: 1.    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  BPH with lower urinary tract symptoms -Prostate volume 103 cc TRUS 2016           3.    History nephrolithiasis              HPI: 69 y.o. male presents for annual follow-up.  Overall he feels well and has no bothersome lower urinary tract symptoms.  He does have some urgency and nocturia x1-2.  He has recently been troubled by pain from the hips to the lower extremities and states his primary care physician is referring him to vascular surgery for further evaluation.  He was seen in radiation oncology September 2020.  PSA last week was stable at 0.3.  PMH: Past Medical History:  Diagnosis Date  . Arteriosclerosis of coronary artery 10/29/2015   Overview:  with mi   . Arthritis   . BPH (benign prostatic hyperplasia)   . Diabetes mellitus without complication ([place])   . Gout   . Heart attack ([place]) 1996  . Hypertension   . Prostate cancer ([place])   . Stroke [facility])     Surgical History: Past Surgical History:  Procedure Laterality Date  . CHOLECYSTECTOMY    . COLONOSCOPY N/A 05/01/2018   Procedure: COLONOSCOPY;  Surgeon: Virgel Manifold, MD;  Location: [facility] ENDOSCOPY;  Service: Endoscopy;  Laterality: N/A;  . ESOPHAGOGASTRODUODENOSCOPY N/A 05/01/2018   Procedure: ESOPHAGOGASTRODUODENOSCOPY (EGD);  Surgeon: Virgel Manifold, MD;  Location: [facility] ENDOSCOPY;  Service: Endoscopy;  Laterality: N/A;  . RIGHT HEART CATHETERIZATION WITH ADENOSINE STUDY  1996    Home Medications:  Allergies as of 01/05/2020      Reactions   Pravachol [pravastatin Sodium]    Rash   Pravastatin Nausea And Vomiting   Other  reaction(s): Unknown   Statins Rash   Pt reports only pravastatin causes rash - takes atorvastatin currently      Medication List       Accurate as of January 05, 2020  2:04 PM. If you have any questions, ask your nurse or doctor.        amLODipine 5 MG tablet Commonly known as: NORVASC Take 5 mg by mouth daily.   aspirin EC 81 MG tablet Take 81 mg by mouth daily.   atorvastatin 20 MG tablet Commonly known as: LIPITOR Take 30 mg by mouth daily.   cloNIDine 0.1 MG tablet Commonly known as: CATAPRES Take 0.1 mg by mouth 2 (two) times daily.   colchicine 0.6 MG tablet Take 0.6 mg by mouth daily as needed (gout flare). Reported on 11/25/2015   colestipol 1 g tablet Commonly known as: COLESTID   fenofibrate 160 MG tablet Take 160 mg by mouth daily.   Fish Oil 1000 MG Caps Take 2,000 mg by mouth daily.   glipiZIDE 5 MG 24 hr tablet Commonly known as: GLUCOTROL XL Take 5 mg by mouth daily with breakfast.   hydrochlorothiazide 25 MG tablet Commonly known as: HYDRODIURIL Take 25 mg by mouth daily.   metFORMIN 500 MG 24 hr tablet Commonly known as: GLUCOPHAGE-XR Take 1,000 mg by mouth daily with supper.   metoprolol 200 MG 24 hr  tablet Commonly known as: TOPROL-XL Take 200 mg by mouth daily.   ondansetron 4 MG disintegrating tablet Commonly known as: Zofran ODT Take 1 tablet (4 mg total) by mouth every 8 (eight) hours as needed for nausea or vomiting.   pantoprazole 20 MG tablet Commonly known as: PROTONIX Take 1 tablet (20 mg total) by mouth daily.   Potassium Chloride ER 20 MEQ Tbcr   potassium chloride SA 20 MEQ tablet Commonly known as: KLOR-CON Take 20 mEq by mouth daily.   ramipril 10 MG capsule Commonly known as: ALTACE Take 10 mg by mouth daily.   rOPINIRole 0.25 MG tablet Commonly known as: REQUIP Take 0.5 mg by mouth at bedtime.   vitamin B-12 100 MCG tablet Commonly known as: CYANOCOBALAMIN Take 100 mcg by mouth daily.   vitamin C 1000  MG tablet Take 1,000 mg by mouth daily.   Vitamin D3 25 MCG (1000 UT) Caps Take 1 capsule by mouth daily.       Allergies:  Allergies  Allergen Reactions  . Pravachol [Pravastatin Sodium]     Rash  . Pravastatin Nausea And Vomiting    Other reaction(s): Unknown  . Statins Rash    Pt reports only pravastatin causes rash - takes atorvastatin currently    Family History: No family history on file.  Social History:  reports that he quit smoking about 24 years ago. He has never used smokeless tobacco. He reports that he does not drink alcohol or use drugs.  ROS: UROLOGY Frequent Urination?: No Hard to postpone urination?: Yes Burning/pain with urination?: No Get up at night to urinate?: No Leakage of urine?: No Urine stream starts and stops?: No Trouble starting stream?: No Do you have to strain to urinate?: No Blood in urine?: No Urinary tract infection?: No Sexually transmitted disease?: No Injury to kidneys or bladder?: No Painful intercourse?: No Weak stream?: No Erection problems?: No Penile pain?: No  Gastrointestinal Nausea?: No Vomiting?: No Indigestion/heartburn?: No Diarrhea?: No Constipation?: No  Constitutional Fever: No Night sweats?: No Weight loss?: No Fatigue?: No  Skin Skin rash/lesions?: No Itching?: Yes  Eyes Blurred vision?: No Double vision?: No  Ears/Nose/Throat Sore throat?: No Sinus problems?: No  Hematologic/Lymphatic Swollen glands?: No Easy bruising?: No  Cardiovascular Leg swelling?: No Chest pain?: No  Respiratory Cough?: No Shortness of breath?: No  Endocrine Excessive thirst?: No  Musculoskeletal Back pain?: Yes Joint pain?: Yes  Neurological Headaches?: Yes Dizziness?: No  Psychologic Depression?: No Anxiety?: No  Physical Exam: BP (!) 165/78   Pulse 68   Ht 6\' 1"  (1.854 m)   Wt 253 lb (114.8 kg)   BMI 33.38 kg/m   Constitutional:  Alert and oriented, No acute distress. HEENT: [place] AT,  moist mucus membranes.  Trachea midline, no masses. Cardiovascular: No clubbing, cyanosis, or edema. Respiratory: Normal respiratory effort, no increased work of breathing. Skin: No rashes, bruises or suspicious lesions. Neurologic: Grossly intact, no focal deficits, moving all 4 extremities. Psychiatric: Normal mood and affect.   Assessment & Plan:    - Prostate cancer Stable PSA.  He has follow-up in radiation oncology September 2021 and will see him in 1 year.   Abbie Sons, [place] [address] [place], [place] [address] [phone]

## 2020-01-08 ENCOUNTER — Encounter: Payer: Self-pay | Admitting: *Deleted

## 2020-08-11 ENCOUNTER — Inpatient Hospital Stay: Payer: Medicare Other | Attending: Radiation Oncology

## 2020-08-11 ENCOUNTER — Other Ambulatory Visit: Payer: Self-pay

## 2020-08-11 DIAGNOSIS — C61 Malignant neoplasm of prostate: Secondary | ICD-10-CM | POA: Diagnosis present

## 2020-08-11 LAB — PSA: Prostatic Specific Antigen: 0.17 ng/mL (ref 0.00–4.00)

## 2020-08-18 ENCOUNTER — Ambulatory Visit: Payer: Medicare Other | Admitting: Radiation Oncology

## 2020-08-26 ENCOUNTER — Ambulatory Visit
Admission: RE | Admit: 2020-08-26 | Discharge: 2020-08-26 | Disposition: A | Discharge status: Home / Self Care | Payer: Medicare Other | Source: Ambulatory Visit | Attending: Radiation Oncology | Admitting: Radiation Oncology

## 2020-08-26 ENCOUNTER — Other Ambulatory Visit: Payer: Self-pay

## 2020-08-26 ENCOUNTER — Encounter: Payer: Self-pay | Admitting: Radiation Oncology

## 2020-08-26 DIAGNOSIS — Z923 Personal history of irradiation: Secondary | ICD-10-CM | POA: Insufficient documentation

## 2020-08-26 DIAGNOSIS — C61 Malignant neoplasm of prostate: Secondary | ICD-10-CM | POA: Diagnosis not present

## 2020-08-26 NOTE — Progress Notes (Signed)
Radiation Oncology Follow up Note  Name: Warren Say Reine Sr.   Date:   08/26/2020 MRN:  [id number] DOB: 11-29-1951    This 69 y.o. male presents to the clinic today for 5-year follow-up status post IMRT radiation therapy for Gleason 7 adenocarcinoma the prostate.  REFERRING PROVIDER: [name], MD  HPI: Patient is a 69 year old male now out 5 years having completed IMRT radiation therapy to his prostate for Gleason 7 (3+4) adenocarcinoma the prostate presenting with a PSA of 6 seen today in routine follow-up he is doing well well.  He specifically denies any increased lower urinary tract symptoms diarrhea or fatigue.  Most recent PSA is been stable at 0.17.  COMPLICATIONS OF TREATMENT: none  FOLLOW UP COMPLIANCE: keeps appointments   PHYSICAL EXAM:  BP (!) (P) 118/59 (BP Location: Left Arm, Patient Position: Sitting)    Pulse (!) (P) 55    Temp (!) (P) 96.4 F (35.8 C) (Tympanic)    Resp (P) 18    Wt (P) 255 lb 3.2 oz (115.8 kg)    BMI (P) 33.67 kg/m  Well-developed well-nourished patient in NAD. HEENT reveals PERLA, EOMI, discs not visualized.  Oral cavity is clear. No oral mucosal lesions are identified. Neck is clear without evidence of cervical or supraclavicular adenopathy. Lungs are clear to A&P. Cardiac examination is essentially unremarkable with regular rate and rhythm without murmur rub or thrill. Abdomen is benign with no organomegaly or masses noted. Motor sensory and DTR levels are equal and symmetric in the upper and lower extremities. Cranial nerves II through XII are grossly intact. Proprioception is intact. No peripheral adenopathy or edema is identified. No motor or sensory levels are noted. Crude visual fields are within normal range.  RADIOLOGY RESULTS: No current films to review  PLAN: Present time patient is under excellent biochemical control of his prostate cancer now out 5 years I am going to discontinue follow-up care.  He continues with yearly follow-ups  with Dr. Bernardo Heater.  Patient knows to call with any concerns at any time.  I would like to take this opportunity to thank you for allowing me to participate in the care of your patient.Noreene Filbert, MD

## 2021-01-06 ENCOUNTER — Ambulatory Visit: Payer: Self-pay | Admitting: Urology

## 2021-01-14 ENCOUNTER — Ambulatory Visit (INDEPENDENT_AMBULATORY_CARE_PROVIDER_SITE_OTHER): Payer: Medicare Other | Admitting: Urology

## 2021-01-14 ENCOUNTER — Encounter: Payer: Self-pay | Admitting: Urology

## 2021-01-14 ENCOUNTER — Other Ambulatory Visit: Payer: Self-pay

## 2021-01-14 VITALS — BP 150/72 | HR 73 | Ht 74.0 in | Wt 253.0 lb

## 2021-01-14 DIAGNOSIS — C61 Malignant neoplasm of prostate: Secondary | ICD-10-CM

## 2021-01-14 DIAGNOSIS — N401 Enlarged prostate with lower urinary tract symptoms: Secondary | ICD-10-CM | POA: Diagnosis not present

## 2021-01-14 NOTE — Progress Notes (Signed)
01/14/2021 12:14 PM   Warren Cera Ruscitti Sr. December 19, 1950 [id number]  Referring provider: [name], MD [address] [place],  [place] [address]  Chief Complaint  Patient presents with  . Prostate Cancer    Urologic history: 1.  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  BPH with lower urinary tract symptoms -Prostate volume 103 cc TRUS 2016  3.  History nephrolithiasis  HPI: 70 y.o. male presents for annual follow-up.   Seen in radiation oncology September 2021 with PSA stable 0.17  Has noted some increased urinary frequency since last years visit but not bothersome enough that he desires medical management  Denies dysuria, gross hematuria  Denies flank, abdominal or pelvic pain  PMH: Past Medical History:  Diagnosis Date  . Arteriosclerosis of coronary artery 10/29/2015   Overview:  with mi   . Arthritis   . BPH (benign prostatic hyperplasia)   . Diabetes mellitus without complication ([place])   . Gout   . Heart attack ([place]) 1996  . Hypertension   . Prostate cancer ([place])   . Stroke [facility])     Surgical History: Past Surgical History:  Procedure Laterality Date  . CHOLECYSTECTOMY    . COLONOSCOPY N/A 05/01/2018   Procedure: COLONOSCOPY;  Surgeon: Virgel Manifold, MD;  Location: [facility] ENDOSCOPY;  Service: Endoscopy;  Laterality: N/A;  . ESOPHAGOGASTRODUODENOSCOPY N/A 05/01/2018   Procedure: ESOPHAGOGASTRODUODENOSCOPY (EGD);  Surgeon: Virgel Manifold, MD;  Location: [facility] ENDOSCOPY;  Service: Endoscopy;  Laterality: N/A;  . RIGHT HEART CATHETERIZATION WITH ADENOSINE STUDY  1996    Home Medications:  Allergies as of 01/14/2021      Reactions   Pravachol [pravastatin Sodium]    Rash   Pravastatin Nausea And Vomiting   Other reaction(s): Unknown   Statins Rash   Pt reports only pravastatin causes rash - takes atorvastatin currently       Medication List       Accurate as of January 14, 2021 12:14 PM. If you have any questions, ask your nurse or doctor.        amLODipine 5 MG tablet Commonly known as: NORVASC Take 5 mg by mouth daily.   aspirin EC 81 MG tablet Take 81 mg by mouth daily.   atorvastatin 40 MG tablet Commonly known as: LIPITOR Take by mouth.   cloNIDine 0.1 MG tablet Commonly known as: CATAPRES Take 0.1 mg by mouth 2 (two) times daily.   colchicine 0.6 MG tablet Take 0.6 mg by mouth daily as needed (gout flare). Reported on 11/25/2015   colestipol 1 g tablet Commonly known as: COLESTID   fenofibrate 160 MG tablet Take 160 mg by mouth daily.   Fish Oil 1000 MG Caps Take 2,000 mg by mouth daily.   glipiZIDE 5 MG 24 hr tablet Commonly known as: GLUCOTROL XL Take 5 mg by mouth daily with breakfast.   hydrochlorothiazide 25 MG tablet Commonly known as: HYDRODIURIL Take 25 mg by mouth daily.   metFORMIN 500 MG 24 hr tablet Commonly known as: GLUCOPHAGE-XR Take 1,000 mg by mouth daily with supper.   metoprolol 200 MG 24 hr tablet Commonly known as: TOPROL-XL Take 200 mg by mouth daily.   ondansetron 4 MG disintegrating tablet Commonly known as: Zofran ODT Take 1 tablet (4 mg total) by mouth every 8 (eight) hours as needed for nausea or vomiting.   pantoprazole 20 MG tablet Commonly known as: PROTONIX Take 1 tablet (20 mg  total) by mouth daily.   Potassium Chloride ER 20 MEQ Tbcr   potassium chloride SA 20 MEQ tablet Commonly known as: KLOR-CON Take 20 mEq by mouth daily.   ramipril 10 MG capsule Commonly known as: ALTACE Take 10 mg by mouth daily.   rOPINIRole 0.25 MG tablet Commonly known as: REQUIP Take 0.5 mg by mouth at bedtime.   vitamin B-12 100 MCG tablet Commonly known as: CYANOCOBALAMIN Take 100 mcg by mouth daily.   vitamin C 1000 MG tablet Take 1,000 mg by mouth daily.   Vitamin D3 25 MCG (1000 UT) Caps Take 1 capsule by mouth daily.        Allergies:  Allergies  Allergen Reactions  . Pravachol [Pravastatin Sodium]     Rash  . Pravastatin Nausea And Vomiting    Other reaction(s): Unknown  . Statins Rash    Pt reports only pravastatin causes rash - takes atorvastatin currently    Family History: History reviewed. No pertinent family history.  Social History:  reports that he quit smoking about 25 years ago. He has never used smokeless tobacco. He reports that he does not drink alcohol and does not use drugs.   Physical Exam: BP (!) 150/72   Pulse 73   Ht 6\' 2"  (1.88 m)   Wt 253 lb (114.8 kg)   BMI 32.48 kg/m   Constitutional:  Alert and oriented, No acute distress. HEENT: [place] AT, moist mucus membranes.  Trachea midline, no masses. Cardiovascular: No clubbing, cyanosis, or edema. Respiratory: Normal respiratory effort, no increased work of breathing. Psychiatric: Normal mood and affect.   Assessment & Plan:    1.  Prostate cancer  PSA remains low and stable  Follow-up 1 year with PSA  2.  Lower urinary tract symptoms  Bladder scan PVR 0 mL  Urinary frequency worse but not bothersome enough that he desires medical management   Abbie Sons, MD  [place] [address], [place], [place] [address] [phone]

## 2021-10-07 ENCOUNTER — Inpatient Hospital Stay
Admission: EM | Admit: 2021-10-07 | Discharge: 2021-10-07 | DRG: 215 | Disposition: A | Discharge status: Other Acute Inpatient Hospital | Payer: Medicare Other | Attending: Internal Medicine | Admitting: Internal Medicine

## 2021-10-07 ENCOUNTER — Other Ambulatory Visit: Payer: Self-pay

## 2021-10-07 ENCOUNTER — Encounter
Admission: EM | Disposition: A | Discharge status: Other Acute Inpatient Hospital | Payer: Self-pay | Source: Home / Self Care | Attending: Internal Medicine

## 2021-10-07 ENCOUNTER — Inpatient Hospital Stay (HOSPITAL_COMMUNITY)
Admission: AD | Admit: 2021-10-07 | Discharge: 2021-11-10 | DRG: 219 | Disposition: E | Payer: Medicare Other | Source: Other Acute Inpatient Hospital | Attending: Internal Medicine | Admitting: Internal Medicine

## 2021-10-07 ENCOUNTER — Emergency Department: Payer: Medicare Other

## 2021-10-07 ENCOUNTER — Encounter: Payer: Self-pay | Admitting: Emergency Medicine

## 2021-10-07 DIAGNOSIS — I5041 Acute combined systolic (congestive) and diastolic (congestive) heart failure: Secondary | ICD-10-CM | POA: Diagnosis present

## 2021-10-07 DIAGNOSIS — Z955 Presence of coronary angioplasty implant and graft: Secondary | ICD-10-CM | POA: Diagnosis not present

## 2021-10-07 DIAGNOSIS — I2510 Atherosclerotic heart disease of native coronary artery without angina pectoris: Secondary | ICD-10-CM

## 2021-10-07 DIAGNOSIS — I5082 Biventricular heart failure: Secondary | ICD-10-CM | POA: Diagnosis present

## 2021-10-07 DIAGNOSIS — J9601 Acute respiratory failure with hypoxia: Secondary | ICD-10-CM | POA: Diagnosis present

## 2021-10-07 DIAGNOSIS — N179 Acute kidney failure, unspecified: Secondary | ICD-10-CM | POA: Diagnosis present

## 2021-10-07 DIAGNOSIS — Z833 Family history of diabetes mellitus: Secondary | ICD-10-CM

## 2021-10-07 DIAGNOSIS — R570 Cardiogenic shock: Secondary | ICD-10-CM | POA: Diagnosis not present

## 2021-10-07 DIAGNOSIS — M1990 Unspecified osteoarthritis, unspecified site: Secondary | ICD-10-CM | POA: Diagnosis present

## 2021-10-07 DIAGNOSIS — I493 Ventricular premature depolarization: Secondary | ICD-10-CM | POA: Diagnosis not present

## 2021-10-07 DIAGNOSIS — C61 Malignant neoplasm of prostate: Secondary | ICD-10-CM | POA: Diagnosis present

## 2021-10-07 DIAGNOSIS — Z7982 Long term (current) use of aspirin: Secondary | ICD-10-CM

## 2021-10-07 DIAGNOSIS — Z9049 Acquired absence of other specified parts of digestive tract: Secondary | ICD-10-CM

## 2021-10-07 DIAGNOSIS — Z8673 Personal history of transient ischemic attack (TIA), and cerebral infarction without residual deficits: Secondary | ICD-10-CM | POA: Diagnosis not present

## 2021-10-07 DIAGNOSIS — R319 Hematuria, unspecified: Secondary | ICD-10-CM | POA: Diagnosis not present

## 2021-10-07 DIAGNOSIS — R0600 Dyspnea, unspecified: Secondary | ICD-10-CM

## 2021-10-07 DIAGNOSIS — Z8546 Personal history of malignant neoplasm of prostate: Secondary | ICD-10-CM

## 2021-10-07 DIAGNOSIS — Z66 Do not resuscitate: Secondary | ICD-10-CM | POA: Diagnosis not present

## 2021-10-07 DIAGNOSIS — I4729 Other ventricular tachycardia: Secondary | ICD-10-CM

## 2021-10-07 DIAGNOSIS — I10 Essential (primary) hypertension: Secondary | ICD-10-CM | POA: Diagnosis present

## 2021-10-07 DIAGNOSIS — M1A9XX0 Chronic gout, unspecified, without tophus (tophi): Secondary | ICD-10-CM | POA: Diagnosis present

## 2021-10-07 DIAGNOSIS — E1165 Type 2 diabetes mellitus with hyperglycemia: Secondary | ICD-10-CM | POA: Diagnosis present

## 2021-10-07 DIAGNOSIS — I700 Atherosclerosis of aorta: Secondary | ICD-10-CM | POA: Diagnosis present

## 2021-10-07 DIAGNOSIS — I959 Hypotension, unspecified: Secondary | ICD-10-CM | POA: Diagnosis not present

## 2021-10-07 DIAGNOSIS — E669 Obesity, unspecified: Secondary | ICD-10-CM | POA: Diagnosis present

## 2021-10-07 DIAGNOSIS — Z87891 Personal history of nicotine dependence: Secondary | ICD-10-CM | POA: Diagnosis not present

## 2021-10-07 DIAGNOSIS — I252 Old myocardial infarction: Secondary | ICD-10-CM | POA: Diagnosis not present

## 2021-10-07 DIAGNOSIS — I509 Heart failure, unspecified: Secondary | ICD-10-CM | POA: Diagnosis not present

## 2021-10-07 DIAGNOSIS — I472 Ventricular tachycardia, unspecified: Secondary | ICD-10-CM | POA: Diagnosis not present

## 2021-10-07 DIAGNOSIS — Z515 Encounter for palliative care: Secondary | ICD-10-CM

## 2021-10-07 DIAGNOSIS — I5021 Acute systolic (congestive) heart failure: Secondary | ICD-10-CM | POA: Diagnosis not present

## 2021-10-07 DIAGNOSIS — E119 Type 2 diabetes mellitus without complications: Secondary | ICD-10-CM | POA: Diagnosis present

## 2021-10-07 DIAGNOSIS — Z79899 Other long term (current) drug therapy: Secondary | ICD-10-CM

## 2021-10-07 DIAGNOSIS — I110 Hypertensive heart disease with heart failure: Secondary | ICD-10-CM | POA: Diagnosis present

## 2021-10-07 DIAGNOSIS — J9602 Acute respiratory failure with hypercapnia: Secondary | ICD-10-CM | POA: Diagnosis present

## 2021-10-07 DIAGNOSIS — Z4659 Encounter for fitting and adjustment of other gastrointestinal appliance and device: Secondary | ICD-10-CM

## 2021-10-07 DIAGNOSIS — N400 Enlarged prostate without lower urinary tract symptoms: Secondary | ICD-10-CM | POA: Diagnosis present

## 2021-10-07 DIAGNOSIS — I255 Ischemic cardiomyopathy: Secondary | ICD-10-CM | POA: Diagnosis present

## 2021-10-07 DIAGNOSIS — I5023 Acute on chronic systolic (congestive) heart failure: Secondary | ICD-10-CM | POA: Diagnosis present

## 2021-10-07 DIAGNOSIS — J810 Acute pulmonary edema: Secondary | ICD-10-CM | POA: Diagnosis not present

## 2021-10-07 DIAGNOSIS — Z6833 Body mass index (BMI) 33.0-33.9, adult: Secondary | ICD-10-CM

## 2021-10-07 DIAGNOSIS — Z8249 Family history of ischemic heart disease and other diseases of the circulatory system: Secondary | ICD-10-CM

## 2021-10-07 DIAGNOSIS — I214 Non-ST elevation (NSTEMI) myocardial infarction: Principal | ICD-10-CM | POA: Diagnosis present

## 2021-10-07 DIAGNOSIS — D696 Thrombocytopenia, unspecified: Secondary | ICD-10-CM | POA: Diagnosis not present

## 2021-10-07 DIAGNOSIS — E785 Hyperlipidemia, unspecified: Secondary | ICD-10-CM | POA: Diagnosis present

## 2021-10-07 DIAGNOSIS — Z9911 Dependence on respirator [ventilator] status: Secondary | ICD-10-CM | POA: Diagnosis not present

## 2021-10-07 DIAGNOSIS — J189 Pneumonia, unspecified organism: Secondary | ICD-10-CM | POA: Diagnosis present

## 2021-10-07 DIAGNOSIS — Y95 Nosocomial condition: Secondary | ICD-10-CM | POA: Diagnosis present

## 2021-10-07 DIAGNOSIS — I25119 Atherosclerotic heart disease of native coronary artery with unspecified angina pectoris: Secondary | ICD-10-CM | POA: Diagnosis present

## 2021-10-07 DIAGNOSIS — Z9189 Other specified personal risk factors, not elsewhere classified: Secondary | ICD-10-CM

## 2021-10-07 DIAGNOSIS — M109 Gout, unspecified: Secondary | ICD-10-CM | POA: Diagnosis present

## 2021-10-07 DIAGNOSIS — Z7189 Other specified counseling: Secondary | ICD-10-CM | POA: Diagnosis not present

## 2021-10-07 DIAGNOSIS — R1310 Dysphagia, unspecified: Secondary | ICD-10-CM | POA: Diagnosis not present

## 2021-10-07 DIAGNOSIS — D62 Acute posthemorrhagic anemia: Secondary | ICD-10-CM | POA: Diagnosis not present

## 2021-10-07 DIAGNOSIS — Z20822 Contact with and (suspected) exposure to covid-19: Secondary | ICD-10-CM | POA: Diagnosis present

## 2021-10-07 DIAGNOSIS — Z888 Allergy status to other drugs, medicaments and biological substances status: Secondary | ICD-10-CM

## 2021-10-07 DIAGNOSIS — R0602 Shortness of breath: Secondary | ICD-10-CM | POA: Diagnosis not present

## 2021-10-07 DIAGNOSIS — Z7984 Long term (current) use of oral hypoglycemic drugs: Secondary | ICD-10-CM

## 2021-10-07 DIAGNOSIS — Z978 Presence of other specified devices: Secondary | ICD-10-CM

## 2021-10-07 DIAGNOSIS — R0603 Acute respiratory distress: Secondary | ICD-10-CM

## 2021-10-07 DIAGNOSIS — Z95811 Presence of heart assist device: Secondary | ICD-10-CM | POA: Diagnosis not present

## 2021-10-07 HISTORY — PX: RIGHT/LEFT HEART CATH AND CORONARY ANGIOGRAPHY: CATH118266

## 2021-10-07 HISTORY — PX: VENTRICULAR ASSIST DEVICE INSERTION: CATH118273

## 2021-10-07 HISTORY — PX: CORONARY STENT INTERVENTION W/IMPELLA: CATH118235

## 2021-10-07 LAB — BASIC METABOLIC PANEL
Anion gap: 10 (ref 5–15)
Anion gap: 9 (ref 5–15)
BUN: 20 mg/dL (ref 8–23)
BUN: 24 mg/dL — ABNORMAL HIGH (ref 8–23)
CO2: 26 mmol/L (ref 22–32)
CO2: 23 mmol/L (ref 22–32)
Calcium: 9.1 mg/dL (ref 8.9–10.3)
Calcium: 8.2 mg/dL — ABNORMAL LOW (ref 8.9–10.3)
Chloride: 102 mmol/L (ref 98–111)
Chloride: 105 mmol/L (ref 98–111)
Creatinine, Ser: 1.13 mg/dL (ref 0.61–1.24)
Creatinine, Ser: 1.24 mg/dL (ref 0.61–1.24)
GFR, Estimated: >60 mL/min (ref >60)
GFR, Estimated: >60 mL/min (ref >60)
Glucose, Bld: 122 mg/dL — ABNORMAL HIGH (ref 70–99)
Glucose, Bld: 172 mg/dL — ABNORMAL HIGH (ref 70–99)
Potassium: 4 mmol/L (ref 3.5–5.1)
Potassium: 4.6 mmol/L (ref 3.5–5.1)
Sodium: 138 mmol/L (ref 135–145)
Sodium: 137 mmol/L (ref 135–145)

## 2021-10-07 LAB — CBC
HCT: 44.4 % (ref 39.0–52.0)
Hemoglobin: 14.4 g/dL (ref 13.0–17.0)
MCH: 26.8 pg (ref 26.0–34.0)
MCHC: 32.4 g/dL (ref 30.0–36.0)
MCV: 82.5 fL (ref 80.0–100.0)
Platelets: 160 10*3/uL (ref 150–400)
RBC: 5.38 MIL/uL (ref 4.22–5.81)
RDW: 14.6 % (ref 11.5–15.5)
WBC: 12.3 10*3/uL — ABNORMAL HIGH (ref 4.0–10.5)
nRBC: 0 % (ref 0.0–0.2)

## 2021-10-07 LAB — RESP PANEL BY RT-PCR (FLU A&B, COVID) ARPGX2
Influenza A by PCR: NEGATIVE
Influenza B by PCR: NEGATIVE
SARS Coronavirus 2 by RT PCR: NEGATIVE

## 2021-10-07 LAB — POCT ACTIVATED CLOTTING TIME
Activated Clotting Time: 179 seconds
Activated Clotting Time: 271 seconds
Activated Clotting Time: 260 seconds
Activated Clotting Time: 289 seconds
Activated Clotting Time: 237 seconds
Activated Clotting Time: 202 seconds
Activated Clotting Time: 126 seconds
Activated Clotting Time: 121 seconds
Activated Clotting Time: 126 seconds

## 2021-10-07 LAB — TROPONIN I (HIGH SENSITIVITY)
Troponin I (High Sensitivity): 1296 ng/L (ref <18)
Troponin I (High Sensitivity): 7889 ng/L (ref <18)

## 2021-10-07 LAB — BLOOD GAS, ARTERIAL
Acid-base deficit: 4.8 mmol/L — ABNORMAL HIGH (ref 0.0–2.0)
Bicarbonate: 21.6 mmol/L (ref 20.0–28.0)
FIO2: 1
MECHVT: 500 mL
Mechanical Rate: 14
O2 Saturation: 88.1 %
PEEP: 8 cmH2O
Patient temperature: 37
RATE: 14 resp/min
pCO2 arterial: 44 mmHg (ref 32.0–48.0)
pH, Arterial: 7.3 — ABNORMAL LOW (ref 7.350–7.450)
pO2, Arterial: 61 mmHg — ABNORMAL LOW (ref 83.0–108.0)

## 2021-10-07 LAB — POCT I-STAT 7, (LYTES, BLD GAS, ICA,H+H)
Acid-Base Excess: 0 mmol/L (ref 0.0–2.0)
Bicarbonate: 26.8 mmol/L (ref 20.0–28.0)
Calcium, Ion: 1.21 mmol/L (ref 1.15–1.40)
HCT: 43 % (ref 39.0–52.0)
Hemoglobin: 14.6 g/dL (ref 13.0–17.0)
O2 Saturation: 98 %
Patient temperature: 98.7
Potassium: 4.9 mmol/L (ref 3.5–5.1)
Sodium: 139 mmol/L (ref 135–145)
TCO2: 28 mmol/L (ref 22–32)
pCO2 arterial: 49.6 mmHg — ABNORMAL HIGH (ref 32.0–48.0)
pH, Arterial: 7.342 — ABNORMAL LOW (ref 7.350–7.450)
pO2, Arterial: 119 mmHg — ABNORMAL HIGH (ref 83.0–108.0)

## 2021-10-07 LAB — PROTIME-INR
INR: 1.2 (ref 0.8–1.2)
Prothrombin Time: 15.1 seconds (ref 11.4–15.2)

## 2021-10-07 LAB — CBG MONITORING, ED: Glucose-Capillary: 116 mg/dL — ABNORMAL HIGH (ref 70–99)

## 2021-10-07 LAB — SURGICAL PCR SCREEN
MRSA, PCR: NEGATIVE
Staphylococcus aureus: NEGATIVE

## 2021-10-07 LAB — GLUCOSE, CAPILLARY
Glucose-Capillary: 105 mg/dL — ABNORMAL HIGH (ref 70–99)
Glucose-Capillary: 161 mg/dL — ABNORMAL HIGH (ref 70–99)

## 2021-10-07 LAB — APTT: aPTT: 31 seconds (ref 24–36)

## 2021-10-07 LAB — HEMOGLOBIN A1C
Hgb A1c MFr Bld: 6.1 % — ABNORMAL HIGH (ref 4.8–5.6)
Mean Plasma Glucose: 128.37 mg/dL

## 2021-10-07 LAB — LACTIC ACID, PLASMA: Lactic Acid, Venous: 1.8 mmol/L (ref 0.5–1.9)

## 2021-10-07 LAB — HIV ANTIBODY (ROUTINE TESTING W REFLEX): HIV Screen 4th Generation wRfx: NONREACTIVE

## 2021-10-07 LAB — COOXEMETRY PANEL
Carboxyhemoglobin: 1.2 % (ref 0.5–1.5)
Methemoglobin: 1 % (ref 0.0–1.5)
O2 Saturation: 71.1 %
Total hemoglobin: 13.9 g/dL (ref 12.0–16.0)

## 2021-10-07 LAB — MAGNESIUM: Magnesium: 1.8 mg/dL (ref 1.7–2.4)

## 2021-10-07 LAB — BRAIN NATRIURETIC PEPTIDE: B Natriuretic Peptide: 344.3 pg/mL — ABNORMAL HIGH (ref 0.0–100.0)

## 2021-10-07 SURGERY — RIGHT/LEFT HEART CATH AND CORONARY ANGIOGRAPHY
Anesthesia: Moderate Sedation

## 2021-10-07 MED ORDER — HEPARIN (PORCINE) IN NACL 1000-0.9 UT/500ML-% IV SOLN
INTRAVENOUS | Status: AC
  Filled 2021-10-07: qty 1000

## 2021-10-07 MED ORDER — MIDAZOLAM HCL 2 MG/2ML IJ SOLN
INTRAMUSCULAR | Status: AC
  Filled 2021-10-07: qty 2

## 2021-10-07 MED ORDER — CANGRELOR TETRASODIUM 50 MG IV SOLR
INTRAVENOUS | Status: AC
  Filled 2021-10-07: qty 50

## 2021-10-07 MED ORDER — HEPARIN SODIUM (PORCINE) 1000 UNIT/ML IJ SOLN
INTRAMUSCULAR | Status: AC
  Filled 2021-10-07: qty 1

## 2021-10-07 MED ORDER — HEPARIN SODIUM (PORCINE) 1000 UNIT/ML IJ SOLN: 4000.0000 [IU] | Freq: Once | INTRAMUSCULAR | Status: DC

## 2021-10-07 MED ORDER — HEPARIN SODIUM (PORCINE) 5000 UNIT/ML IJ SOLN: 4000.0000 [IU] | Freq: Once | INTRAMUSCULAR | Status: DC

## 2021-10-07 MED ORDER — TICAGRELOR 90 MG PO TABS
ORAL_TABLET | ORAL | Status: DC | PRN
  Administered 2021-10-07: 180 mg via NASOGASTRIC

## 2021-10-07 MED ORDER — IOHEXOL 350 MG/ML SOLN
INTRAVENOUS | Status: DC | PRN
  Administered 2021-10-07: 166 mL

## 2021-10-07 MED ORDER — DOCUSATE SODIUM 50 MG/5ML PO LIQD
100.0000 mg | Freq: Two times a day (BID) | ORAL | Status: DC
  Administered 2021-10-07 – 2021-10-16 (×18): 100 mg
  Filled 2021-10-07 (×18): qty 10

## 2021-10-07 MED ORDER — SODIUM CHLORIDE 0.9% FLUSH: 10.0000 mL | INTRAVENOUS | Status: DC | PRN

## 2021-10-07 MED ORDER — MIDAZOLAM-SODIUM CHLORIDE 100-0.9 MG/100ML-% IV SOLN
0.0000 mg/h | INTRAVENOUS | Status: DC
  Administered 2021-10-08: 2.5 mg/h via INTRAVENOUS
  Administered 2021-10-09: 2 mg/h via INTRAVENOUS
  Filled 2021-10-07 (×2): qty 100

## 2021-10-07 MED ORDER — SUCCINYLCHOLINE CHLORIDE 200 MG/10ML IV SOSY
PREFILLED_SYRINGE | INTRAVENOUS | Status: AC
  Filled 2021-10-07: qty 10

## 2021-10-07 MED ORDER — HEPARIN BOLUS VIA INFUSION
4000.0000 [IU] | Freq: Once | INTRAVENOUS | Status: AC
  Administered 2021-10-07: 4000 [IU] via INTRAVENOUS
  Filled 2021-10-07: qty 4000

## 2021-10-07 MED ORDER — FENTANYL CITRATE PF 50 MCG/ML IJ SOSY: 25.0000 ug | PREFILLED_SYRINGE | Freq: Once | INTRAMUSCULAR | Status: DC

## 2021-10-07 MED ORDER — VERAPAMIL HCL 2.5 MG/ML IV SOLN
INTRAVENOUS | Status: DC | PRN
  Administered 2021-10-07: 2.5 mg via INTRA_ARTERIAL

## 2021-10-07 MED ORDER — CHLORHEXIDINE GLUCONATE CLOTH 2 % EX PADS
6.0000 | MEDICATED_PAD | Freq: Every day | CUTANEOUS | Status: DC
  Administered 2021-10-07 – 2021-10-15 (×11): 6 via TOPICAL

## 2021-10-07 MED ORDER — FENTANYL BOLUS VIA INFUSION
25.0000 ug | INTRAVENOUS | Status: DC | PRN
  Administered 2021-10-09 – 2021-10-10 (×4): 50 ug via INTRAVENOUS
  Administered 2021-10-11: 100 ug via INTRAVENOUS
  Administered 2021-10-11: 75 ug via INTRAVENOUS
  Filled 2021-10-07: qty 100

## 2021-10-07 MED ORDER — LIDOCAINE HCL 1 % IJ SOLN
INTRAMUSCULAR | Status: AC
  Filled 2021-10-07: qty 20

## 2021-10-07 MED ORDER — ROCURONIUM BROMIDE 50 MG/5ML IV SOLN
INTRAVENOUS | Status: DC | PRN
  Administered 2021-10-07: 100 mg via INTRAVENOUS

## 2021-10-07 MED ORDER — CHLORHEXIDINE GLUCONATE 0.12% ORAL RINSE (MEDLINE KIT)
15.0000 mL | Freq: Two times a day (BID) | OROMUCOSAL | Status: DC
  Administered 2021-10-07 – 2021-10-16 (×16): 15 mL via OROMUCOSAL

## 2021-10-07 MED ORDER — MUPIROCIN 2 % EX OINT
1.0000 "application " | TOPICAL_OINTMENT | Freq: Two times a day (BID) | CUTANEOUS | Status: AC
  Administered 2021-10-07 – 2021-10-12 (×10): 1 via NASAL
  Filled 2021-10-07 (×2): qty 22

## 2021-10-07 MED ORDER — SODIUM CHLORIDE 0.9% FLUSH
10.0000 mL | Freq: Two times a day (BID) | INTRAVENOUS | Status: DC
  Administered 2021-10-07 – 2021-10-13 (×10): 10 mL

## 2021-10-07 MED ORDER — TICAGRELOR 90 MG PO TABS
ORAL_TABLET | ORAL | Status: AC
  Filled 2021-10-07: qty 2

## 2021-10-07 MED ORDER — CANGRELOR BOLUS VIA INFUSION
INTRAVENOUS | Status: DC | PRN
  Administered 2021-10-07: 3348 ug via INTRAVENOUS

## 2021-10-07 MED ORDER — FENTANYL CITRATE PF 50 MCG/ML IJ SOSY
50.0000 ug | PREFILLED_SYRINGE | Freq: Once | INTRAMUSCULAR | Status: AC
Start: 2021-10-07 — End: 2021-10-07
  Administered 2021-10-07: 50 ug via INTRAVENOUS
  Filled 2021-10-07: qty 1

## 2021-10-07 MED ORDER — ATORVASTATIN CALCIUM 80 MG PO TABS: 80.0000 mg | ORAL_TABLET | Freq: Every day | ORAL | Status: DC

## 2021-10-07 MED ORDER — HEPARIN (PORCINE) 25000 UT/250ML-% IV SOLN
1350.0000 [IU]/h | INTRAVENOUS | Status: DC
  Administered 2021-10-07: 1350 [IU]/h via INTRAVENOUS
  Filled 2021-10-07: qty 250

## 2021-10-07 MED ORDER — INSULIN ASPART 100 UNIT/ML IJ SOLN
0.0000 [IU] | INTRAMUSCULAR | Status: DC
  Administered 2021-10-07 – 2021-10-08 (×5): 3 [IU] via SUBCUTANEOUS
  Administered 2021-10-08: 2 [IU] via SUBCUTANEOUS
  Administered 2021-10-08 – 2021-10-09 (×6): 3 [IU] via SUBCUTANEOUS
  Administered 2021-10-09: 2 [IU] via SUBCUTANEOUS
  Administered 2021-10-09: 3 [IU] via SUBCUTANEOUS
  Administered 2021-10-10: 5 [IU] via SUBCUTANEOUS
  Administered 2021-10-10 (×3): 3 [IU] via SUBCUTANEOUS
  Administered 2021-10-10: 5 [IU] via SUBCUTANEOUS
  Administered 2021-10-10 – 2021-10-11 (×6): 3 [IU] via SUBCUTANEOUS
  Administered 2021-10-11: 2 [IU] via SUBCUTANEOUS
  Administered 2021-10-12 – 2021-10-13 (×7): 3 [IU] via SUBCUTANEOUS
  Administered 2021-10-13 (×4): 5 [IU] via SUBCUTANEOUS
  Administered 2021-10-14: 3 [IU] via SUBCUTANEOUS
  Administered 2021-10-14 (×3): 5 [IU] via SUBCUTANEOUS
  Administered 2021-10-14 (×2): 3 [IU] via SUBCUTANEOUS
  Administered 2021-10-14: 5 [IU] via SUBCUTANEOUS
  Administered 2021-10-15 (×2): 3 [IU] via SUBCUTANEOUS
  Administered 2021-10-15: 8 [IU] via SUBCUTANEOUS
  Administered 2021-10-15 – 2021-10-16 (×6): 5 [IU] via SUBCUTANEOUS
  Administered 2021-10-16: 3 [IU] via SUBCUTANEOUS

## 2021-10-07 MED ORDER — HEPARIN SODIUM (PORCINE) 5000 UNIT/ML IJ SOLN
INTRAVENOUS | Status: DC
  Filled 2021-10-07 (×4): qty 10

## 2021-10-07 MED ORDER — MIDAZOLAM BOLUS VIA INFUSION
0.0000 mg | INTRAVENOUS | Status: DC | PRN
  Administered 2021-10-09: 0.5 mg via INTRAVENOUS
  Administered 2021-10-09 – 2021-10-10 (×2): 1 mg via INTRAVENOUS
  Administered 2021-10-10: 2 mg via INTRAVENOUS
  Administered 2021-10-10: 1 mg via INTRAVENOUS
  Administered 2021-10-11 (×2): 1.5 mg via INTRAVENOUS
  Filled 2021-10-07: qty 5

## 2021-10-07 MED ORDER — MILRINONE LACTATE IN DEXTROSE 20-5 MG/100ML-% IV SOLN
0.1250 ug/kg/min | INTRAVENOUS | Status: DC
  Administered 2021-10-07 – 2021-10-13 (×8): 0.125 ug/kg/min via INTRAVENOUS
  Filled 2021-10-07 (×8): qty 100

## 2021-10-07 MED ORDER — SODIUM CHLORIDE 0.9% FLUSH
3.0000 mL | Freq: Two times a day (BID) | INTRAVENOUS | Status: DC
  Administered 2021-10-08 – 2021-10-14 (×12): 3 mL via INTRAVENOUS

## 2021-10-07 MED ORDER — FENTANYL CITRATE (PF) 100 MCG/2ML IJ SOLN
INTRAMUSCULAR | Status: AC
  Filled 2021-10-07: qty 2

## 2021-10-07 MED ORDER — HEPARIN (PORCINE) IN NACL 1000-0.9 UT/500ML-% IV SOLN
INTRAVENOUS | Status: DC | PRN
  Administered 2021-10-07: 1000 mL

## 2021-10-07 MED ORDER — POLYETHYLENE GLYCOL 3350 17 G PO PACK
17.0000 g | PACK | Freq: Every day | ORAL | Status: DC
  Administered 2021-10-08 – 2021-10-16 (×7): 17 g
  Filled 2021-10-07 (×8): qty 1

## 2021-10-07 MED ORDER — MIDAZOLAM HCL 2 MG/2ML IJ SOLN
INTRAMUSCULAR | Status: DC | PRN
  Administered 2021-10-07: 1 mg via INTRAVENOUS
  Administered 2021-10-07: 2 mg via INTRAVENOUS
  Administered 2021-10-07: 1 mg via INTRAVENOUS
  Administered 2021-10-07 (×3): 2 mg via INTRAVENOUS

## 2021-10-07 MED ORDER — NOREPINEPHRINE 4 MG/250ML-% IV SOLN
INTRAVENOUS | Status: AC
  Administered 2021-10-07: 4 mg
  Filled 2021-10-07: qty 250

## 2021-10-07 MED ORDER — SODIUM CHLORIDE 0.9 % IV BOLUS
500.0000 mL | Freq: Once | INTRAVENOUS | Status: AC
  Administered 2021-10-07: 500 mL via INTRAVENOUS

## 2021-10-07 MED ORDER — ETOMIDATE 2 MG/ML IV SOLN
INTRAVENOUS | Status: AC
  Filled 2021-10-07: qty 20

## 2021-10-07 MED ORDER — SODIUM CHLORIDE 0.9% FLUSH: 3.0000 mL | INTRAVENOUS | Status: DC | PRN

## 2021-10-07 MED ORDER — TICAGRELOR 90 MG PO TABS
90.0000 mg | ORAL_TABLET | Freq: Two times a day (BID) | ORAL | Status: DC
  Administered 2021-10-08: 90 mg via ORAL
  Filled 2021-10-07: qty 1

## 2021-10-07 MED ORDER — ONDANSETRON HCL 4 MG/2ML IJ SOLN
INTRAMUSCULAR | Status: AC
  Filled 2021-10-07: qty 2

## 2021-10-07 MED ORDER — FUROSEMIDE 10 MG/ML IJ SOLN
INTRAMUSCULAR | Status: DC | PRN
  Administered 2021-10-07: 40 mg via INTRAVENOUS

## 2021-10-07 MED ORDER — ASPIRIN 81 MG PO CHEW: 81.0000 mg | CHEWABLE_TABLET | Freq: Every day | ORAL | Status: DC

## 2021-10-07 MED ORDER — DIGOXIN 125 MCG PO TABS
0.1250 mg | ORAL_TABLET | Freq: Every day | ORAL | Status: DC
  Administered 2021-10-07: 0.125 mg via ORAL
  Filled 2021-10-07: qty 1

## 2021-10-07 MED ORDER — ROCURONIUM BROMIDE 10 MG/ML (PF) SYRINGE
PREFILLED_SYRINGE | INTRAVENOUS | Status: AC
  Filled 2021-10-07: qty 10

## 2021-10-07 MED ORDER — ONDANSETRON HCL 4 MG/2ML IJ SOLN
INTRAMUSCULAR | Status: DC | PRN
  Administered 2021-10-07: 4 mg via INTRAVENOUS

## 2021-10-07 MED ORDER — ASPIRIN 81 MG PO CHEW: 324.0000 mg | CHEWABLE_TABLET | Freq: Once | ORAL | Status: DC

## 2021-10-07 MED ORDER — VERAPAMIL HCL 2.5 MG/ML IV SOLN
INTRAVENOUS | Status: AC
  Filled 2021-10-07: qty 2

## 2021-10-07 MED ORDER — LIDOCAINE HCL (PF) 1 % IJ SOLN
INTRAMUSCULAR | Status: DC | PRN
  Administered 2021-10-07: 2 mL

## 2021-10-07 MED ORDER — NITROGLYCERIN 1 MG/10 ML FOR IR/CATH LAB
INTRA_ARTERIAL | Status: DC | PRN
  Administered 2021-10-07 (×2): 200 ug via INTRACORONARY

## 2021-10-07 MED ORDER — MIDAZOLAM-SODIUM CHLORIDE 100-0.9 MG/100ML-% IV SOLN
0.5000 mg/h | INTRAVENOUS | Status: DC
  Administered 2021-10-07: 3 mg/h via INTRAVENOUS
  Filled 2021-10-07: qty 100

## 2021-10-07 MED ORDER — NOREPINEPHRINE BITARTRATE 1 MG/ML IV SOLN
INTRAVENOUS | Status: AC
  Filled 2021-10-07: qty 4

## 2021-10-07 MED ORDER — ETOMIDATE 2 MG/ML IV SOLN
INTRAVENOUS | Status: DC | PRN
  Administered 2021-10-07: 30 mg via INTRAVENOUS

## 2021-10-07 MED ORDER — NOREPINEPHRINE 4 MG/250ML-% IV SOLN
0.0000 ug/min | INTRAVENOUS | Status: DC
  Administered 2021-10-07 – 2021-10-10 (×2): 1 ug/min via INTRAVENOUS
  Filled 2021-10-07 (×2): qty 250

## 2021-10-07 MED ORDER — FENTANYL 2500MCG IN NS 250ML (10MCG/ML) PREMIX INFUSION
25.0000 ug/h | INTRAVENOUS | Status: DC
  Administered 2021-10-08 – 2021-10-09 (×2): 100 ug/h via INTRAVENOUS
  Administered 2021-10-10: 150 ug/h via INTRAVENOUS
  Administered 2021-10-10: 125 ug/h via INTRAVENOUS
  Administered 2021-10-11: 150 ug/h via INTRAVENOUS
  Filled 2021-10-07 (×5): qty 250

## 2021-10-07 MED ORDER — FENTANYL 2500MCG IN NS 250ML (10MCG/ML) PREMIX INFUSION
0.0000 ug/h | INTRAVENOUS | Status: DC
  Administered 2021-10-07: 50 ug/h via INTRAVENOUS
  Filled 2021-10-07: qty 250

## 2021-10-07 MED ORDER — SODIUM CHLORIDE 0.9 % IV SOLN
INTRAVENOUS | Status: DC | PRN
  Administered 2021-10-07: 4 ug/kg/min via INTRAVENOUS

## 2021-10-07 MED ORDER — SODIUM CHLORIDE 0.9 % IV SOLN: INTRAVENOUS | Status: DC

## 2021-10-07 MED ORDER — ORAL CARE MOUTH RINSE
15.0000 mL | OROMUCOSAL | Status: DC
  Administered 2021-10-08 – 2021-10-11 (×37): 15 mL via OROMUCOSAL

## 2021-10-07 MED ORDER — HEPARIN SODIUM (PORCINE) 1000 UNIT/ML IJ SOLN
INTRAMUSCULAR | Status: DC | PRN
  Administered 2021-10-07: 2000 [IU] via INTRAVENOUS
  Administered 2021-10-07: 6000 [IU] via INTRAVENOUS
  Administered 2021-10-07: 2000 [IU] via INTRAVENOUS
  Administered 2021-10-07: 5000 [IU] via INTRAVENOUS
  Administered 2021-10-07: 2000 [IU] via INTRAVENOUS

## 2021-10-07 MED ORDER — FENTANYL CITRATE (PF) 100 MCG/2ML IJ SOLN
INTRAMUSCULAR | Status: DC | PRN
  Administered 2021-10-07: 25 ug via INTRAVENOUS
  Administered 2021-10-07: 50 ug via INTRAVENOUS
  Administered 2021-10-07 (×2): 25 ug via INTRAVENOUS

## 2021-10-07 MED ORDER — SODIUM CHLORIDE 0.9 % IV SOLN
250.0000 mL | INTRAVENOUS | Status: DC | PRN
Start: 2021-10-07 — End: 2021-10-17
  Administered 2021-10-07 – 2021-10-16 (×3): 250 mL via INTRAVENOUS

## 2021-10-07 MED ORDER — NOREPINEPHRINE BITARTRATE 1 MG/ML IV SOLN
INTRAVENOUS | Status: DC | PRN
  Administered 2021-10-07: 5 ug/kg/min via INTRAVENOUS

## 2021-10-07 MED ORDER — FUROSEMIDE 10 MG/ML IJ SOLN
INTRAMUSCULAR | Status: AC
  Filled 2021-10-07: qty 4

## 2021-10-07 SURGICAL SUPPLY — 34 items
BALLN MINITREK RX 2.0X12 (BALLOONS) ×2
BALLN ~~LOC~~ TREK RX 2.75X15 (BALLOONS) ×2
BALLN ~~LOC~~ TREK RX 3.5X8 (BALLOONS) ×2
BALLOON MINITREK RX 2.0X12 (BALLOONS) IMPLANT
BALLOON ~~LOC~~ TREK RX 2.75X15 (BALLOONS) IMPLANT
BALLOON ~~LOC~~ TREK RX 3.5X8 (BALLOONS) IMPLANT
CANNULA 5F STIFF (CANNULA) ×1 IMPLANT
CASSETTE CONTROL PURGE IMPELLA (MISCELLANEOUS) ×2 IMPLANT
CATH INFINITI 5 FR JL3.5 (CATHETERS) ×1 IMPLANT
CATH INFINITI 5FR ANG PIGTAIL (CATHETERS) ×1 IMPLANT
CATH INFINITI JR4 5F (CATHETERS) ×1 IMPLANT
CATH LAUNCHER 6FR EBU3.5 (CATHETERS) ×1 IMPLANT
CATH SWAN GANZ VIP 7.5F (CATHETERS) ×1 IMPLANT
DEVICE RAD TR BAND REGULAR (VASCULAR PRODUCTS) ×1 IMPLANT
DRAPE BRACHIAL (DRAPES) ×1 IMPLANT
GLIDESHEATH SLEND SS 6F .021 (SHEATH) ×1 IMPLANT
GUIDEWIRE EMER 3M J .025X150CM (WIRE) ×1 IMPLANT
IMMOB KNEE 14 THIGH 24 706614 (SOFTGOODS) ×1 IMPLANT
KIT ENCORE 26 ADVANTAGE (KITS) ×1 IMPLANT
PACK CARDIAC CATH (CUSTOM PROCEDURE TRAY) ×2 IMPLANT
PROTECTION STATION PRESSURIZED (MISCELLANEOUS) ×2
SET ATX SIMPLICITY (MISCELLANEOUS) ×1 IMPLANT
SET IMPELLA CP PUMP (CATHETERS) ×1 IMPLANT
SHEATH AVANTI 5FR X 11CM (SHEATH) ×1 IMPLANT
SHEATH BRITE TIP 8FRX11 (SHEATH) ×1 IMPLANT
SLEEVE REPOSITIONING LENGTH 30 (MISCELLANEOUS) ×1 IMPLANT
STATION PROTECTION PRESSURIZED (MISCELLANEOUS) IMPLANT
STENT ONYX FRONTIER 2.75X38 (Permanent Stent) ×1 IMPLANT
SUT SILK 0 FSL (SUTURE) ×1 IMPLANT
TUBING CIL FLEX 10 FLL-RA (TUBING) ×1 IMPLANT
WIRE ASAHI FIELDER XT 300CM (WIRE) ×1 IMPLANT
WIRE GUIDERIGHT .035X150 (WIRE) ×1 IMPLANT
WIRE HI TORQ VERSACORE J 260CM (WIRE) ×1 IMPLANT
WIRE RUNTHROUGH .014X180CM (WIRE) ×1 IMPLANT

## 2021-10-07 NOTE — ED Notes (Signed)
Dr. Ellender Hose at bedside. Repeat EKG performed. Pt endorses increased CP and SOB. Pt on 2L [place]. Secretary to call CODE STEMI.

## 2021-10-07 NOTE — ED Notes (Signed)
Report from [facility]

## 2021-10-07 NOTE — ED Provider Notes (Signed)
Emergency Medicine Provider Triage Evaluation Note  Warren Zeck Frye Sr. , a 70 y.o. male  was evaluated in triage.  Pt complains of acute onset sharp pain in the right side of his chest starting at 4 AM this morning. Now states chest pain is improved but complains of SOB, denies fever or cough.  Review of Systems  Positive: Chest pain, SOB Negative: Fever, cough, nausea, vomiting.  Physical Exam  BP 133/76 (BP Location: Right Arm)   Pulse 93   Temp 97.8 F (36.6 C) (Oral)   Resp (!) 30   Ht 6\' 1"  (1.854 m)   Wt 111.6 kg   SpO2 98%   BMI 32.46 kg/m  Gen:   Awake, no distress  Resp:  Normal effort, CTAB MSK:   Moves extremities without difficulty, 2+ radial pulses bilaterally Other:  Regular rate and rhythm, no murmurs, rubs, or gallops  Medical Decision Making  Medically screening exam initiated at 7:34 AM.  Appropriate orders placed.  Warren Cera Ege Sr. was informed that the remainder of the evaluation will be completed by another provider, this initial triage assessment does not replace that evaluation, and the importance of remaining in the ED until their evaluation is complete.    Blake Divine, MD 09/11/2021 [phone]

## 2021-10-07 NOTE — H&P (Addendum)
Advanced Heart Failure Team History and Physical Note   PCP:  [name], MD  PCP-Cardiology: None     Reason for Admission: Cardiogenic shock   HPI:    Warren Kye Gunther Sr. is a 70 y.o. male with history of CAD s/p MI with placement of 2 stents in RCA 1996, prostate cancer, HTN, DM type II, HLD, hx CVA.    Echo 06/22: LVEF > 55%, moderate AI, mild MR   Lexiscan Cardiolite, 06/22: Large anterior apical defect with reversible ischemia, some increased GI uptake.   Follows with Dr. Yaakov Guthrie at [facility]. Last seen in August 2022 and noted improvement in exertional angina with increasing nitrates.   Presented to the ED at [facility] this am with complaints of chest pressure and dyspnea. Ruled in for NSTEMI. Subtle ST elevation in anterior leads that did not meet criteria for STEMI. Taken to the cath lab emergently. Found to have 75% distal LM, occlusion pLAD and ostial Lcx. Prior to intervention developed respiratory failure and was intubated. Underwent Impella supported PCI/DES distal LM through mid LAD. EF 25% on LV gram. LVEDP 45 mmHg. Patient subsequently transferred to [facility] for management of cardiogenic shock.    Review of Systems: [y] = yes, [ ]  = no  Unable to obtain. Intubated and sedated. General: Weight gain [ ] ; Weight loss [ ] ; Anorexia [ ] ; Fatigue [ ] ; Fever [ ] ; Chills [ ] ; Weakness [ ]   Cardiac: Chest pain/pressure [ ] ; Resting SOB [ ] ; Exertional SOB [ ] ; Orthopnea [ ] ; Pedal Edema [ ] ; Palpitations [ ] ; Syncope [ ] ; Presyncope [ ] ; Paroxysmal nocturnal dyspnea[ ]   Pulmonary: Cough [ ] ; Wheezing[ ] ; Hemoptysis[ ] ; Sputum [ ] ; Snoring [ ]   GI: Vomiting[ ] ; Dysphagia[ ] ; Melena[ ] ; Hematochezia [ ] ; Heartburn[ ] ; Abdominal pain [ ] ; Constipation [ ] ; Diarrhea [ ] ; BRBPR [ ]   GU: Hematuria[ ] ; Dysuria [ ] ; Nocturia[ ]   Vascular: Pain in legs with walking [ ] ; Pain in feet with lying flat [ ] ; Non-healing sores [ ] ; Stroke [ ] ; TIA [ ] ; Slurred speech [ ] ;  Neuro: Headaches[  ]; Vertigo[ ] ; Seizures[ ] ; Paresthesias[ ] ;Blurred vision [ ] ; Diplopia [ ] ; Vision changes [ ]   Ortho/Skin: Arthritis [ ] ; Joint pain [ ] ; Muscle pain [ ] ; Joint swelling [ ] ; Back Pain [ ] ; Rash [ ]   Psych: Depression[ ] ; Anxiety[ ]   Heme: Bleeding problems [ ] ; Clotting disorders [ ] ; Anemia [ ]   Endocrine: Diabetes [ ] ; Thyroid dysfunction[ ]    Home Medications Prior to Admission medications   Medication Sig Start Date End Date Taking? Authorizing Provider  amLODipine (NORVASC) 5 MG tablet Take 5 mg by mouth daily.  04/11/15   [provider]  Ascorbic Acid (VITAMIN C) 1000 MG tablet Take 1,000 mg by mouth daily.    [provider]  aspirin EC 81 MG tablet Take 81 mg by mouth daily.    [provider]  atorvastatin (LIPITOR) 40 MG tablet Take by mouth. 08/14/20   [provider]  Cholecalciferol (VITAMIN D3) 1000 UNITS CAPS Take 1 capsule by mouth daily.    [provider]  cloNIDine (CATAPRES) 0.1 MG tablet Take 0.1 mg by mouth 2 (two) times daily.  03/30/15   [provider]  colchicine 0.6 MG tablet Take 0.6 mg by mouth daily as needed (gout flare). Reported on 11/25/2015 04/08/15   [provider]  colestipol (COLESTID) 1 g tablet  08/16/19  [provider]  fenofibrate 160 MG tablet Take 160 mg by mouth daily.    [provider]  glipiZIDE (GLUCOTROL XL) 5 MG 24 hr tablet Take 5 mg by mouth daily with breakfast.  03/22/15   [provider]  hydrochlorothiazide (HYDRODIURIL) 25 MG tablet Take 25 mg by mouth daily.  04/11/15   [provider]  metFORMIN (GLUCOPHAGE-XR) 500 MG 24 hr tablet Take 1,000 mg by mouth daily with supper.  05/14/16   [provider]  metoprolol (TOPROL-XL) 200 MG 24 hr tablet Take 200 mg by mouth daily.  04/11/15   [provider]  Omega-3 Fatty Acids (FISH OIL) 1000 MG CAPS Take 2,000 mg by mouth daily.    [provider]  ondansetron (ZOFRAN ODT) 4  MG disintegrating tablet Take 1 tablet (4 mg total) by mouth every 8 (eight) hours as needed for nausea or vomiting. 05/05/18   Paulette Blanch, MD  pantoprazole (PROTONIX) 20 MG tablet Take 1 tablet (20 mg total) by mouth daily. 04/07/19   Virgel Manifold, MD  Potassium Chloride ER 20 MEQ TBCR  08/16/19   [provider]  potassium chloride SA (K-DUR,KLOR-CON) 20 MEQ tablet Take 20 mEq by mouth daily.  04/11/15   [provider]  ramipril (ALTACE) 10 MG capsule Take 10 mg by mouth daily.  03/30/15   [provider]  vitamin B-12 (CYANOCOBALAMIN) 100 MCG tablet Take 100 mcg by mouth daily.    [provider]    Past Medical History: Past Medical History:  Diagnosis Date   Arteriosclerosis of coronary artery 10/29/2015   Overview:  with mi    Arthritis    BPH (benign prostatic hyperplasia)    Diabetes mellitus without complication ([place])    Gout    Heart attack ([place]) 1996   Hypertension    Prostate cancer ([place])    Stroke [facility])     Past Surgical History: Past Surgical History:  Procedure Laterality Date   CHOLECYSTECTOMY     COLONOSCOPY N/A 05/01/2018   Procedure: COLONOSCOPY;  Surgeon: Virgel Manifold, MD;  Location: [facility] ENDOSCOPY;  Service: Endoscopy;  Laterality: N/A;   ESOPHAGOGASTRODUODENOSCOPY N/A 05/01/2018   Procedure: ESOPHAGOGASTRODUODENOSCOPY (EGD);  Surgeon: Virgel Manifold, MD;  Location: [facility] ENDOSCOPY;  Service: Endoscopy;  Laterality: N/A;   RIGHT HEART CATHETERIZATION WITH ADENOSINE STUDY  1996    Family History: Unable to review with patient at time of evaluation - intubated and sedated No family history on file.  Social History: Social History   Socioeconomic History   Marital status: Married    Spouse name: Not on file   Number of children: Not on file   Years of education: Not on file   Highest education level: Not on file  Occupational History   Not on file  Tobacco Use   Smoking status: Former    Types:  Cigarettes    Quit date: 09/15/1995    Years since quitting: 26.0   Smokeless tobacco: Never  Vaping Use   Vaping Use: Never used  Substance and Sexual Activity   Alcohol use: No    Alcohol/week: 0.0 standard drinks   Drug use: No   Sexual activity: Yes  Other Topics Concern   Not on file  Social History Narrative   Not on file   Social Determinants of Health   Financial Resource Strain: Not on file  Food Insecurity: Not on file  Transportation Needs: Not on file  Physical Activity: Not on  file  Stress: Not on file  Social Connections: Not on file    Allergies:  Allergies  Allergen Reactions   Pravachol [Pravastatin Sodium]     Rash   Pravastatin Nausea And Vomiting    Other reaction(s): Unknown   Statins Rash    Pt reports only pravastatin causes rash - takes atorvastatin currently    Objective:    Vital Signs:   Temp:  [97.8 F (36.6 C)] 97.8 F (36.6 C) (10/28 0650) Pulse Rate:  [72-113] 113 (10/28 1544) Resp:  [16-30] 20 (10/28 1544) BP: (79-162)/(60-106) 160/100 (10/28 1544) SpO2:  [91 %-99 %] 96 % (10/28 1739) FiO2 (%):  [70 %-100 %] 70 % (10/28 1739) Weight:  [111.6 kg] 111.6 kg (10/28 0651)   There were no vitals filed for this visit.   Physical Exam   CVP 12 General:  Intubated, sedated.  HEENT: ETT tube Neck: Supple. + JVD. Carotids 2+ bilat; no bruits. No lymphadenopathy or thyromegaly appreciated. Cor: PMI nondisplaced. Regular rate & rhythm. No rubs, gallops or murmurs. Lungs: Clear anteriorly Abdomen: Soft, nontender, nondistended.  Extremities: Lower extremities are cool, right femoral CVC, impella CP Neuro: Sedated  EKG   Sinus 1st degree AVB, 90 bpm, ST elevation in anterior leads  Labs     Basic Metabolic Panel: Recent Labs  Lab 10/01/2021 0659  NA 138  K 4.0  CL 102  CO2 26  GLUCOSE 122*  BUN 20  CREATININE 1.13  CALCIUM 9.1    Liver Function Tests: No results for input(s): AST, ALT, ALKPHOS, BILITOT, PROT, ALBUMIN  in the last 168 hours. No results for input(s): LIPASE, AMYLASE in the last 168 hours. No results for input(s): AMMONIA in the last 168 hours.  CBC: Recent Labs  Lab 10/05/2021 0659  WBC 12.3*  HGB 14.4  HCT 44.4  MCV 82.5  PLT 160    Cardiac Enzymes: No results for input(s): CKTOTAL, CKMB, CKMBINDEX, TROPONINI in the last 168 hours.  BNP: BNP (last 3 results) Recent Labs    10/08/2021 0659  BNP 344.3*    ProBNP (last 3 results) No results for input(s): PROBNP in the last 8760 hours.   CBG: Recent Labs  Lab 09/29/2021 1037 09/25/2021 1211  GLUCAP 116* 105*    Coagulation Studies: Recent Labs    10/04/2021 1055  LABPROT 15.1  INR 1.2    Imaging: DG Chest 2 View  Result Date: 10/04/2021 CLINICAL DATA:  Chest pain EXAM: CHEST - 2 VIEW COMPARISON:  04/24/2009 FINDINGS: The heart size and mediastinal contours are within normal limits. Diffuse bilateral interstitial pulmonary opacity. The visualized skeletal structures are unremarkable. IMPRESSION: Diffuse bilateral interstitial pulmonary opacity, consistent with edema or infection. No focal airspace opacity. Electronically Signed   By: Delanna Ahmadi M.D.   On: 09/24/2021 07:25   CARDIAC CATHETERIZATION  Result Date: 09/19/2021 Conclusion: Severe two-vessel coronary artery disease with 75% distal LMCA stenosis, as well as occlusions of proximal LAD and ostial LCx.  Mild-moderate disease also noted in ramus intermedius and RCA/PDA. Severely reduced LVEF and severely elevated left ventricular filling pressure. Marked respiratory distress prior to PCI and Impella insertion requiring emergent intubation. Successful Impella supported PCI to distal LMCA through mid LAD using Onyx Frontier 2.75 x 38 mm drug-eluting stent with 0% residual stenosis and TIMI-3 flow. Recommendations: Continue dual antiplatelet therapy with aspirin and ticagrelor for at least 12 months. Discontinue cangrelor infusion 2 hours after ticagrelor load was  given. Transfer to [name] for ongoing management/weaning of  pressors and Impella per advanced heart failure service. Aggressive secondary prevention of coronary artery disease. Nelva Bush, MD [facility]    Patient Profile   70 y.o. male with history of CAD with prior MI in 18s, DM II, HTN, HLD, hx CVA. Now admitted with NSTEMI complicated by cardiogenic shock and acute systolic HF.  Assessment/Plan   Acute systolic HF/Cardiogenic shock: -2/2 NSTEMI. LVEF 25% on LV gram. LVEDP severely elevated -Impella CP, P-8. -SVR 1400, CO 4.8 -Start milrinone 0.125 -Monitor Co-ox -Given 40 mg furosemide IV this afternoon. Appears volume up. CVP 12 -Echo pending   2. NSTEMI with known CAD: -Prior inferior MI and placement of 2 stents to RCA in 1996 -Presented with this am with chest pain. Troponin 1,296 > 7,889. Subtle ST elevation in anterior leads but did not meet criteria for STEMI. -LHC with 75% dLM, occlusion pLAD and ostial Lcx. S/p Impella supported PCI to distal LM through mid LAD -Received loading dose brilinta 4 pm. ASA. DAPT X 1 year. -Statin.  -No beta blocker d/t shock.   3. Acute respiratory failure with hypoxia: -Emergently intubated prior to PCI -Consult CCM  4. DM II -Last A1c 6.4 -SSI  FINCH, LINDSAY N, PA-C 09/20/2021, 5:58 PM  Advanced Heart Failure Team Pager [phone] (M-F; 7a - 5p)  Please contact [facility] for night-coverage after hours (4p -7a ) and weekends on [email or web address]    Agree with above.   70 y/o male with CAD, DM2 admitted to [facility] with NSTEMI. Found to have 75% LM occluded and occluded LAD and LCx. Underwent stenting of LM and LAD.  EF ~20%. RV ok. Intubated. Impella placed.  Arrived on NE 1 and Impella P-8 with 3.6L flow.   General:  Intubated. Sedated HEENT: normal + ETT Neck: supple. JVP up  Carotids 2+ bilat; no bruits. No lymphadenopathy or thryomegaly appreciated. Cor: PMI nondisplaced. Regular rate & rhythm. No rubs, gallops  or murmurs. Lungs: clear Abdomen: obese soft, nontender, nondistended. No hepatosplenomegaly. No bruits or masses. Good bowel sounds. Extremities: RFA impella RFV swan no cyanosis, clubbing, rash, 1+ edema  cool distally  Neuro: intubated sedated  He is critically ill with cardiogenic shock in setting of recent MI. Now s/p stenting of LM and LAD with Impella placement.   I did echo at bedside EF ~20% RV normal. Impella repositioned (pulled back a little bit more than 1cm).   [place] numbers reviewed. Will add milrinone and digoxin.   Hopefully can wean support over next few days. If not can consider durable VAD.   Watch LE perfusion.   CRITICAL CARE Performed by: Glori Bickers  Total critical care time: 60 minutes  Critical care time was exclusive of separately billable procedures and treating other patients.  Critical care was necessary to treat or prevent imminent or life-threatening deterioration.  Critical care was time spent personally by me (independent of midlevel providers or residents) on the following activities: development of treatment plan with patient and/or surrogate as well as nursing, discussions with consultants, evaluation of patient's response to treatment, examination of patient, obtaining history from patient or surrogate, ordering and performing treatments and interventions, ordering and review of laboratory studies, ordering and review of radiographic studies, pulse oximetry and re-evaluation of patient's condition.  Glori Bickers, MD  6:42 PM

## 2021-10-07 NOTE — ED Notes (Signed)
Currently discharging a patient to make a bed available for patient.

## 2021-10-07 NOTE — ED Notes (Addendum)
Report given to cath lab RN. PT R Arm IV not flushing, RN notified in cath lab

## 2021-10-07 NOTE — Consult Note (Signed)
ANTICOAGULATION CONSULT NOTE - Initial Consult  Pharmacy Consult for Heparin infusion Indication: chest pain/ACS  Allergies  Allergen Reactions   Pravachol [Pravastatin Sodium]     Rash   Pravastatin Nausea And Vomiting    Other reaction(s): Unknown   Statins Rash    Pt reports only pravastatin causes rash - takes atorvastatin currently    Patient Measurements: Height: 6\' 1"  (185.4 cm) Weight: 111.6 kg (246 lb) IBW/kg (Calculated) : 79.9 Heparin Dosing Weight: 103.4 kg   Vital Signs: Temp: 97.8 F (36.6 C) (10/28 0650) Temp Source: Oral (10/28 0650) BP: 95/64 (10/28 0918) Pulse Rate: 72 (10/28 0918)  Labs: Recent Labs    09/23/2021 0659 09/27/2021 0920  HGB 14.4  --   HCT 44.4  --   PLT 160  --   CREATININE 1.13  --   TROPONINIHS 1,296* 7,889*    Estimated Creatinine Clearance: 79.7 mL/min (by C-G formula based on SCr of 1.13 mg/dL).   Medical History: Past Medical History:  Diagnosis Date   Arteriosclerosis of coronary artery 10/29/2015   Overview:  with mi    Arthritis    BPH (benign prostatic hyperplasia)    Diabetes mellitus without complication ([place])    Gout    Heart attack ([place]) 1996   Hypertension    Prostate cancer ([place])    Stroke ([place])     Medications:  No prior AC noted   Assessment: a 70 y.o. male complains of acute onset sharp pain in the right side of his chest. Troponin 1300>7800. Pharmacy has been consulted for initiation and management of heparin infusion  Goal of Therapy:  Heparin level 0.3-0.7 units/ml Monitor platelets by anticoagulation protocol: Yes   Plan:  Give 4000 units bolus x 1 Start heparin infusion at 1350 units/hr Check anti-Xa level in 6 hours and daily while on heparin Continue to monitor H&H and platelets  Dorothe Pea, PharmD, BCPS Clinical Pharmacist   09/19/2021,10:54 AM

## 2021-10-07 NOTE — H&P (View-Only) (Signed)
Cardiology Consultation:   Patient ID: Warren Rinehart Hardgrave Sr. MRN: [id number]; DOB: Apr 19, 1951  Admit date: 10/03/2021 Date of Consult: 09/30/2021  PCP:  Warren Curry, MD   Warren Stone Providers Cardiologist:  Dr. Clayborn Stone   Patient Profile:   Warren Miley Fritzsche Sr. is a 70 y.o. male with a hx of coronary artery disease status post 2 stents placed in the 1990s, hypertension, hyperlipidemia, stroke, type 2 diabetes mellitus, kidney stones, and prostate cancer, who is being seen 10/08/2021 for the evaluation of chest pain and elevated troponin at the request of her Warren Stone.  History of Present Illness:   Warren Stone reports waking up in a cold sweat around 4:30 this morning.  He also had severe chest and his pain radiating up to the back of his neck.  He rates the pain as 8/10 with minimal improvement with sublingual nitroglycerin.  He subsequently contacted EMS and was given 4 baby aspirin with improvement in the chest pain.  He has continued to have waxing and waning chest pain here in the ED.  Initial EKGs showed subtle anterior ST elevation, most pronounced in V3, that was not felt to be consistent with STEMI criteria.  His chest pain have subsided to 2/10 but has now recrudesce to 4/10.  Serial EKGs have showed subtle variation in his ST elevations though they do not meet STEMI criteria.  He has experienced exertional chest pain and shortness of breath for months, having last been seen by Dr. Clayborn Stone in mid August.  At that time, he reported some improvement in his exertional angina following escalation of isosorbide mononitrate.   Past Medical History:  Diagnosis Date   Arteriosclerosis of coronary artery 10/29/2015   Overview:  with mi    Arthritis    BPH (benign prostatic hyperplasia)    Diabetes mellitus without complication (Warren Stone)    Gout    Heart attack ([place]) 1996   Hypertension    Prostate cancer (Warren Stone)    Stroke Warren Stone)     Past Surgical History:  Procedure  Laterality Date   CHOLECYSTECTOMY     COLONOSCOPY N/A 05/01/2018   Procedure: COLONOSCOPY;  Surgeon: Warren Manifold, MD;  Location: [facility] ENDOSCOPY;  Service: Endoscopy;  Laterality: N/A;   ESOPHAGOGASTRODUODENOSCOPY N/A 05/01/2018   Procedure: ESOPHAGOGASTRODUODENOSCOPY (EGD);  Surgeon: Warren Manifold, MD;  Location: [facility] ENDOSCOPY;  Service: Endoscopy;  Laterality: N/A;   RIGHT HEART CATHETERIZATION WITH ADENOSINE STUDY  1996     Home Medications:  Prior to Admission medications   Medication Sig Start Date [place] Date Taking? Authorizing Provider  amLODipine (NORVASC) 5 MG tablet Take 5 mg by mouth daily.  04/11/15   [provider]  Ascorbic Acid (VITAMIN C) 1000 MG tablet Take 1,000 mg by mouth daily.    [provider]  aspirin EC 81 MG tablet Take 81 mg by mouth daily.    [provider]  atorvastatin (LIPITOR) 40 MG tablet Take by mouth. 08/14/20   [provider]  Cholecalciferol (VITAMIN D3) 1000 UNITS CAPS Take 1 capsule by mouth daily.    [provider]  cloNIDine (CATAPRES) 0.1 MG tablet Take 0.1 mg by mouth 2 (two) times daily.  03/30/15   [provider]  colchicine 0.6 MG tablet Take 0.6 mg by mouth daily as needed (gout flare). Reported on 11/25/2015 04/08/15   [provider]  colestipol (COLESTID) 1 g tablet  08/16/19   [provider]  fenofibrate 160 MG tablet Take 160 mg by mouth  daily.    [provider]  glipiZIDE (GLUCOTROL XL) 5 MG 24 hr tablet Take 5 mg by mouth daily with breakfast.  03/22/15   [provider]  hydrochlorothiazide (HYDRODIURIL) 25 MG tablet Take 25 mg by mouth daily.  04/11/15   [provider]  metFORMIN (GLUCOPHAGE-XR) 500 MG 24 hr tablet Take 1,000 mg by mouth daily with supper.  05/14/16   [provider]  metoprolol (TOPROL-XL) 200 MG 24 hr tablet Take 200 mg by mouth daily.  04/11/15   [provider]  Omega-3 Fatty Acids (FISH OIL)  1000 MG CAPS Take 2,000 mg by mouth daily.    [provider]  ondansetron (ZOFRAN ODT) 4 MG disintegrating tablet Take 1 tablet (4 mg total) by mouth every 8 (eight) hours as needed for nausea or vomiting. 05/05/18   Warren Blanch, MD  pantoprazole (PROTONIX) 20 MG tablet Take 1 tablet (20 mg total) by mouth daily. 04/07/19   Warren Manifold, MD  Potassium Chloride ER 20 MEQ TBCR  08/16/19   [provider]  potassium chloride SA (K-DUR,KLOR-CON) 20 MEQ tablet Take 20 mEq by mouth daily.  04/11/15   [provider]  ramipril (ALTACE) 10 MG capsule Take 10 mg by mouth daily.  03/30/15   [provider]  vitamin B-12 (CYANOCOBALAMIN) 100 MCG tablet Take 100 mcg by mouth daily.    [provider]    Inpatient Medications: Scheduled Meds:  Continuous Infusions:  sodium chloride Stopped (09/20/2021 1132)   heparin 1,350 Units/hr (09/30/2021 1106)   PRN Meds:   Allergies:    Allergies  Allergen Reactions   Pravachol [Pravastatin Sodium]     Rash   Pravastatin Nausea And Vomiting    Other reaction(s): Unknown   Statins Rash    Pt reports only pravastatin causes rash - takes atorvastatin currently    Social History:   Social History   Tobacco Use   Smoking status: Former    Types: Cigarettes    Quit date: 09/15/1995    Years since quitting: 26.0   Smokeless tobacco: Never  Vaping Use   Vaping Use: Never used  Substance Use Topics   Alcohol use: No    Alcohol/week: 0.0 standard drinks   Drug use: No     Family History:   Mother - CAD, DM, and HTN  ROS:  Please see the history of present illness. All other ROS reviewed and negative.     Physical Exam/Data:   Vitals:   09/20/2021 0918 10/10/2021 1045 09/28/2021 1100 10/09/2021 1115  BP: 95/64 98/69 107/67 99/60  Pulse: 72 90 94 92  Resp: 16 (!) 28 (!) 21 (!) 21  Temp:      TempSrc:      SpO2: 96% 99% 96% 96%  Weight:      Height:       No intake or output data in the 24 hours ending  09/11/2021 1150 Last 3 Weights 09/26/2021 01/14/2021 08/26/2020  Weight (lbs) 246 lb 253 lb 255 lb 3.2 oz  Weight (kg) 111.585 kg 114.76 kg 115.758 kg     Body mass index is 32.46 kg/m.  General: Unwell appearing man, lying on stretcher.  His wife is at the bedside. HEENT: normal Neck: no JVD Vascular: No carotid bruits; Distal pulses 2+ bilaterally Cardiac: Regular rate and rhythm with no murmurs, rubs, or gallops. Lungs: Mildly diminished breath sounds throughout with faint crackles at the left lung base. Abd: soft, nontender, no hepatomegaly  Ext: Trace pretibial edema. Musculoskeletal:  No deformities, BUE and BLE strength normal and equal Skin: warm and dry  Neuro:  CNs 2-12 intact, no focal abnormalities noted Psych:  Normal affect   EKG:  The EKG was personally reviewed and demonstrates: Normal sinus rhythm with first-degree AV block, LVH, and 2 mm of ST elevation isolated to V3.  There is subtle ST elevation in V2 and V4 as well. Telemetry:  Telemetry was personally reviewed and demonstrates: Sinus rhythm with PVCs.  Relevant CV Studies: Pharmacologic MPI (06/07/2021): Large reversible anterior defect.  LVEF 46%.  Laboratory Data:  High Sensitivity Troponin:   Recent Labs  Lab 10/02/2021 0659 10/01/2021 0920  TROPONINIHS 1,296* 7,889*     Chemistry Recent Labs  Lab 09/18/2021 0659  NA 138  K 4.0  CL 102  CO2 26  GLUCOSE 122*  BUN 20  CREATININE 1.13  CALCIUM 9.1  GFRNONAA >60  ANIONGAP 10    No results for input(s): PROT, ALBUMIN, AST, ALT, ALKPHOS, BILITOT in the last 168 hours. Lipids No results for input(s): CHOL, TRIG, HDL, LABVLDL, LDLCALC, CHOLHDL in the last 168 hours.  Hematology Recent Labs  Lab 09/17/2021 0659  WBC 12.3*  RBC 5.38  HGB 14.4  HCT 44.4  MCV 82.5  MCH 26.8  MCHC 32.4  RDW 14.6  PLT 160   Thyroid No results for input(s): TSH, FREET4 in the last 168 hours.  BNP Recent Labs  Lab 09/28/2021 0659  BNP 344.3*    DDimer No results for  input(s): DDIMER in the last 168 hours.   Radiology/Studies:  DG Chest 2 View  Result Date: 10/10/2021 CLINICAL DATA:  Chest pain EXAM: CHEST - 2 VIEW COMPARISON:  04/24/2009 FINDINGS: The heart size and mediastinal contours are within normal limits. Diffuse bilateral interstitial pulmonary opacity. The visualized skeletal structures are unremarkable. IMPRESSION: Diffuse bilateral interstitial pulmonary opacity, consistent with edema or infection. No focal airspace opacity. Electronically Signed   By: Delanna Ahmadi M.D.   On: 10/04/2021 07:25     Assessment and Plan:   High risk NSTEMI: Patient presents with sudden onset of chest pain and EKG findings concerning for acute MI with subtle anterior ST elevation that does not meet STEMI criteria.  Given continued chest pain and significantly elevated troponin, I recommended proceeding with urgent cardiac catheterization and possible PCI.  I discussed this procedure with the patient, his wife, and Dr. Nehemiah Massed, who is covering for Dr. Clayborn Stone.  We have agreed to proceed.  In the meantime, IV heparin and aspirin should be continued.  I would hold further IV fluids, as the patient has some dyspnea with crackles on lung exam concerning for acute heart failure.  LVEF noted to be mildly reduced on recent myocardial perfusion stress test.  Acute heart failure: Patient with shortness of breath and crackles on lung exam today (NYHA class IV).  BNP also elevated.  LVEF noted to be mildly reduced on stress test earlier this year.  Will he will likely need diuresis, though I will defer that until after initiation cardiac catheterization.  Shared Decision Making/Informed Consent The risks [stroke (1 in 1000), death (1 in 1000), kidney failure [usually temporary] (1 in 500), bleeding (1 in 200), allergic reaction [possibly serious] (1 in 200)], benefits (diagnostic support and management of coronary artery disease) and alternatives of a cardiac catheterization  were discussed in detail with Warren Stone and he is willing to proceed.   For questions or updates, please contact [place] Please  consult [email or web address] for contact info under    Signed, Nelva Bush, MD  09/21/2021 11:50 AM

## 2021-10-07 NOTE — ED Provider Notes (Signed)
[facility] Emergency Department Provider Note  ____________________________________________   Event Date/Time   First MD Initiated Contact with Patient 09/29/2021 1037     (approximate)  I have reviewed the triage vital signs and the nursing notes.   HISTORY  Chief Complaint Chest Pain and Code STEMI    HPI Warren Stecklein Feng Sr. is a 70 y.o. male with past medical history as below here with chest pain.  The patient states he awoke this morning at around 4 AM with dull, aching, substernal chest pressure.  He felt short of breath.  He had difficulty catching his breath.  He states he took nitroglycerin and it improved somewhat.  However, it persisted throughout the morning so he called EMS.  With EMS, he was given an aspirin and additional nitroglycerin which did essentially resolve his pain.  It has since returned.  He has had some associated shortness of breath.  He has had some mild diaphoresis.  Symptoms radiate down towards his left arm.  Denies any specific worsening factors.  He does note that over the last several weeks, he has had some increasing dyspnea and mild chest pressure with exertion.    Past Medical History:  Diagnosis Date   Arteriosclerosis of coronary artery 10/29/2015   Overview:  with mi    Arthritis    BPH (benign prostatic hyperplasia)    Diabetes mellitus without complication ([place])    Gout    Heart attack ([place]) 1996   Hypertension    Prostate cancer ([place])    Stroke [facility])     Patient Active Problem List   Diagnosis Date Noted   Benign prostatic hyperplasia with lower urinary tract symptoms 01/05/2020   Radiation proctitis    Internal hemorrhoids    Diverticulosis of large intestine without diverticulitis    Rectal polyp    Ectopic gastric tissue    Esophageal polyp    Blood in stool 04/30/2018   Arteriosclerosis of coronary artery 10/29/2015   Diabetes mellitus, type 2 ([place]) 10/29/2015   Type 2 diabetes mellitus ([place])  10/29/2015   Acute gouty arthritis 04/08/2015   H/O renal calculi 04/08/2015   CA of prostate ([place]) 04/01/2015   Malignant neoplasm of prostate ([place]) 04/01/2015   Essential (primary) hypertension 09/30/2014   Episodic tension type headache 09/10/2014   Adiposity 09/10/2014   Bilateral tinnitus 09/10/2014   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[place]) 06/23/2014   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[place]) 06/23/2014   Cephalalgia 05/06/2014   Buzzing in ear 05/06/2014   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[place]) 01/05/2014   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[place]) 01/05/2014   Arthropathia 04/04/2013   Back ache 04/04/2013   Hypercholesterolemia without hypertriglyceridemia 02/13/2012   Pure hypercholesterolemia 02/13/2012    Past Surgical History:  Procedure Laterality Date   CHOLECYSTECTOMY     COLONOSCOPY N/A 05/01/2018   Procedure: COLONOSCOPY;  Surgeon: Virgel Manifold, MD;  Location: [facility] ENDOSCOPY;  Service: Endoscopy;  Laterality: N/A;   ESOPHAGOGASTRODUODENOSCOPY N/A 05/01/2018   Procedure: ESOPHAGOGASTRODUODENOSCOPY (EGD);  Surgeon: Virgel Manifold, MD;  Location[facility] ENDOSCOPY;  Service: Endoscopy;  Laterality: N/A;   RIGHT HEART CATHETERIZATION WITH ADENOSINE STUDY  1996    Prior to Admission medications   Medication Sig Start Date End Date Taking? Authorizing Provider  amLODipine (NORVASC) 5 MG tablet Take 5 mg by mouth daily.  04/11/15   [provider]  Ascorbic Acid (VITAMIN C) 1000 MG tablet Take 1,000 mg by mouth daily.    [provider]  aspirin EC 81 MG tablet Take 81 mg by mouth daily.    [provider]  atorvastatin (LIPITOR) 40 MG tablet Take by mouth. 08/14/20   [provider]  Cholecalciferol (VITAMIN D3) 1000 UNITS CAPS Take 1 capsule by mouth daily.    [provider]  cloNIDine (CATAPRES) 0.1 MG tablet Take 0.1 mg by mouth 2 (two) times daily.  03/30/15   [provider]  colchicine 0.6 MG tablet Take 0.6 mg by  mouth daily as needed (gout flare). Reported on 11/25/2015 04/08/15   [provider]  colestipol (COLESTID) 1 g tablet  08/16/19   [provider]  fenofibrate 160 MG tablet Take 160 mg by mouth daily.    [provider]  glipiZIDE (GLUCOTROL XL) 5 MG 24 hr tablet Take 5 mg by mouth daily with breakfast.  03/22/15   [provider]  hydrochlorothiazide (HYDRODIURIL) 25 MG tablet Take 25 mg by mouth daily.  04/11/15   [provider]  metFORMIN (GLUCOPHAGE-XR) 500 MG 24 hr tablet Take 1,000 mg by mouth daily with supper.  05/14/16   [provider]  metoprolol (TOPROL-XL) 200 MG 24 hr tablet Take 200 mg by mouth daily.  04/11/15   [provider]  Omega-3 Fatty Acids (FISH OIL) 1000 MG CAPS Take 2,000 mg by mouth daily.    [provider]  ondansetron (ZOFRAN ODT) 4 MG disintegrating tablet Take 1 tablet (4 mg total) by mouth every 8 (eight) hours as needed for nausea or vomiting. 05/05/18   Paulette Blanch, MD  pantoprazole (PROTONIX) 20 MG tablet Take 1 tablet (20 mg total) by mouth daily. 04/07/19   Virgel Manifold, MD  Potassium Chloride ER 20 MEQ TBCR  08/16/19   [provider]  potassium chloride SA (K-DUR,KLOR-CON) 20 MEQ tablet Take 20 mEq by mouth daily.  04/11/15   [provider]  ramipril (ALTACE) 10 MG capsule Take 10 mg by mouth daily.  03/30/15   [provider]  vitamin B-12 (CYANOCOBALAMIN) 100 MCG tablet Take 100 mcg by mouth daily.    [provider]    Allergies Pravachol [pravastatin sodium], Pravastatin, and Statins  No family history on file.  Social History Social History   Tobacco Use   Smoking status: Former    Types: Cigarettes    Quit date: 09/15/1995    Years since quitting: 26.0   Smokeless tobacco: Never  Vaping Use   Vaping Use: Never used  Substance Use Topics   Alcohol use: No    Alcohol/week: 0.0 standard drinks   Drug use: No    Review of Systems   Review of Systems  Constitutional:  Positive for fatigue. Negative for chills and fever.  HENT:  Negative for sore throat.   Respiratory:  Positive for chest tightness. Negative for shortness of breath.   Cardiovascular:  Positive for chest pain.  Gastrointestinal:  Negative for abdominal pain.  Genitourinary:  Negative for flank pain.  Musculoskeletal:  Negative for neck pain.  Skin:  Negative for rash and wound.  Allergic/Immunologic: Negative for immunocompromised state.  Neurological:  Negative for weakness and numbness.  Hematological:  Does not bruise/bleed easily.  All other systems reviewed and are negative.   ____________________________________________  PHYSICAL EXAM:      VITAL SIGNS: ED Triage Vitals  Enc Vitals Group     BP 09/23/2021 0650 133/76     Pulse Rate 10/05/2021 0650 93     Resp 09/22/2021 0650 [name])  30     Temp 09/22/2021 0650 97.8 F (36.6 C)     Temp Source 09/13/2021 0650 Oral     SpO2 09/27/2021 0646 94 %     Weight 09/16/2021 0651 246 lb (111.6 kg)     Height 10/04/2021 0651 6\' 1"  (1.854 m)     Head Circumference --      Peak Flow --      Pain Score 10/04/2021 0651 2     Pain Loc --      Pain Edu? --      Excl. in [place]? --      Physical Exam Vitals and nursing note reviewed.  Constitutional:      General: He is not in acute distress.    Appearance: He is well-developed.  HENT:     Head: Normocephalic and atraumatic.  Eyes:     Conjunctiva/sclera: Conjunctivae normal.  Cardiovascular:     Rate and Rhythm: Normal rate and regular rhythm.     Heart sounds: Normal heart sounds. No murmur heard.   No friction rub.  Pulmonary:     Effort: Pulmonary effort is normal. No respiratory distress.     Breath sounds: Normal breath sounds. No wheezing or rales.  Abdominal:     General: There is no distension.     Palpations: Abdomen is soft.     Tenderness: There is no abdominal tenderness.  Musculoskeletal:     Cervical back: Neck supple.  Skin:    General:  Skin is warm.     Capillary Refill: Capillary refill takes less than 2 seconds.  Neurological:     Mental Status: He is alert and oriented to person, place, and time.     Motor: No abnormal muscle tone.      ____________________________________________   LABS (all labs ordered are listed, but only abnormal results are displayed)  Labs Reviewed  CBC - Abnormal; Notable for the following components:      Result Value   WBC 12.3 (*)    All other components within normal limits  BASIC METABOLIC PANEL - Abnormal; Notable for the following components:   Glucose, Bld 122 (*)    All other components within normal limits  BRAIN NATRIURETIC PEPTIDE - Abnormal; Notable for the following components:   B Natriuretic Peptide 344.3 (*)    All other components within normal limits  BLOOD GAS, ARTERIAL - Abnormal; Notable for the following components:   pH, Arterial 7.30 (*)    pO2, Arterial 61 (*)    Acid-base deficit 4.8 (*)    Allens test (pass/fail) NOT APPLICABLE (*)    All other components within normal limits  CBG MONITORING, ED - Abnormal; Notable for the following components:   Glucose-Capillary 116 (*)    All other components within normal limits  TROPONIN I (HIGH SENSITIVITY) - Abnormal; Notable for the following components:   Troponin I (High Sensitivity) 1,296 (*)    All other components within normal limits  TROPONIN I (HIGH SENSITIVITY) - Abnormal; Notable for the following components:   Troponin I (High Sensitivity) 7,889 (*)    All other components within normal limits  RESP PANEL BY RT-PCR (FLU A&B, COVID) ARPGX2  APTT  PROTIME-INR  HEPARIN LEVEL (UNFRACTIONATED)    ____________________________________________  EKG: Normal sinus rhythm, ventricular rate 91.  PR 238, QRS 114, QTc 432.  ST changes noted in V2 and V3.  T wave inversions in the inferior leads. ________________________________________  RADIOLOGY All imaging, including plain films, CT scans,  and  ultrasounds, independently reviewed by me, and interpretations confirmed via formal radiology reads.  ED MD interpretation:   Chest x-ray: Diffuse bilateral pulmonary opacities concerning for edema  Official radiology report(s): DG Chest 2 View  Result Date: 09/15/2021 CLINICAL DATA:  Chest pain EXAM: CHEST - 2 VIEW COMPARISON:  04/24/2009 FINDINGS: The heart size and mediastinal contours are within normal limits. Diffuse bilateral interstitial pulmonary opacity. The visualized skeletal structures are unremarkable. IMPRESSION: Diffuse bilateral interstitial pulmonary opacity, consistent with edema or infection. No focal airspace opacity. Electronically Signed   By: Delanna Ahmadi M.D.   On: 09/29/2021 07:25    ____________________________________________  PROCEDURES   Procedure(s) performed (including Critical Care):  .1-3 Lead EKG Interpretation Performed by: Duffy Bruce, MD Authorized by: Duffy Bruce, MD     Interpretation: normal     ECG rate:  90-100   ECG rate assessment: normal     Rhythm: sinus rhythm     Ectopy: none     Conduction: normal   Comments:     Indication: Chest pain .Critical Care Performed by: Duffy Bruce, MD Authorized by: Duffy Bruce, MD   Critical care provider statement:    Critical care time (minutes):  45   Critical care time was exclusive of:  Separately billable procedures and treating other patients   Critical care was necessary to treat or prevent imminent or life-threatening deterioration of the following conditions:  Cardiac failure, circulatory failure and respiratory failure   Critical care was time spent personally by me on the following activities:  Development of treatment plan with patient or surrogate, discussions with consultants, evaluation of patient's response to treatment, examination of patient, interpretation of cardiac output measurements, ordering and performing treatments and interventions, ordering and review of  laboratory studies, re-evaluation of patient's condition, review of old charts, pulse oximetry and ordering and review of radiographic studies   I assumed direction of critical care for this patient from another provider in my specialty: no     Care discussed with: admitting provider    ____________________________________________  [place] / MDM / [place] / ED COURSE  As part of my medical decision making, I reviewed the following data within the [place] notes reviewed and incorporated, Old chart reviewed, Notes from prior ED visits, and  Controlled Substance Database       *Warren Tinnell Mahadeo Sr. was evaluated in Emergency Department on 10/01/2021 for the symptoms described in the history of present illness. He was evaluated in the context of the global COVID-19 pandemic, which necessitated consideration that the patient might be at risk for infection with the SARS-CoV-2 virus that causes COVID-19. Institutional protocols and algorithms that pertain to the evaluation of patients at risk for COVID-19 are in a state of rapid change based on information released by regulatory bodies including the CDC and federal and state organizations. These policies and algorithms were followed during the patient's care in the ED.  Some ED evaluations and interventions may be delayed as a result of limited staffing during the pandemic.*     Medical Decision Making: 70 year old male here with chest pressure and shortness of breath.  Patient history and EKG concerning for acute ischemia.  Initial EKG does show ST changes in V2 and V3, with possible reciprocal changes in the inferior leads (see Dr. Charna Archer notes).  The second EKG obtained in triage seems to have resolved these elevations. Immediately upon my assessment upon rooming in the ED, repeat EKG obtained  and to me, is concerning for dynamic ST changes in the precordial leads. CODE STEMI activated by myself.  Changes are subtle but suspect at least highly unstable NSTEMI..  Patient given aspirin with EMS and placed on heparin with bolus in the ED.  Dr. Saunders Revel notified after activation and at bedside to discuss.  Chest x-ray reviewed, shows possible pulmonary edema.  Suspect patient very well may have an ongoing or completed infarct with developing failure.  Will hold on fluids.  This was ordered but not administered.  Low-dose fentanyl given for pain as I am hesitant to give nitroglycerin with borderline low blood pressures.  Patient will be taken to the Cath Lab and admitted.  Reviewed prior Cardiology notes, which show h/o CAD, recent increase in [place]. ____________________________________________  FINAL CLINICAL IMPRESSION(S) / ED DIAGNOSES  Final diagnoses:  NSTEMI (non-ST elevated myocardial infarction) ([place])     MEDICATIONS GIVEN DURING THIS VISIT:  Medications  heparin bolus via infusion 4,000 Units (4,000 Units Intravenous Bolus from Bag 10/01/2021 1106)    Followed by  heparin ADULT infusion 100 units/mL (25000 units/226mL) (0 Units/hr Intravenous Stopped 09/18/2021 1205)  0.9 %  sodium chloride infusion (0 mLs Intravenous Stopped 09/29/2021 1132)  verapamil (ISOPTIN) injection (2.5 mg Intra-arterial Given 10/09/2021 1216)  lidocaine (PF) (XYLOCAINE) 1 % injection (2 mLs Other Given 09/27/2021 1216)  ondansetron (ZOFRAN) injection (4 mg Intravenous Given 09/22/2021 1217)  Heparin (Porcine) in NaCl 1000-0.9 UT/500ML-% SOLN (1,000 mLs  Given 09/20/2021 1218)  heparin sodium (porcine) injection (2,000 Units Intravenous Given 09/14/2021 1349)  furosemide (LASIX) injection (40 mg Intravenous Given 10/05/2021 1220)  etomidate (AMIDATE) 2 MG/ML injection (has no administration in time range)  etomidate (AMIDATE) injection (30 mg Intravenous Given 09/28/2021 1310)  rocuronium (ZEMURON) injection (100 mg Intravenous Given 09/17/2021 1312)  fentaNYL (SUBLIMAZE) injection (50 mcg Intravenous Given 10/10/2021 1335)   midazolam (VERSED) injection (2 mg Intravenous Given 09/13/2021 1335)  norepinephrine (LEVOPHED) 4 mg in dextrose 5 % 250 mL (0.016 mg/mL) infusion (5 mcg/min Intravenous Rate/Dose Change 10/06/2021 1336)  cangrelor (KENGREAL) bolus via infusion (3,348 mcg Intravenous Given 10/03/2021 1328)  cangrelor (KENGREAL) 50,000 mcg in sodium chloride 0.9 % 250 mL (200 mcg/mL) infusion (4 mcg/kg/min  111.6 kg Intravenous New Bag/Given 10/06/2021 1329)  nitroGLYCERIN 1 mg/10 mL (100 mcg/mL) - IR/CATH LAB (200 mcg Intracoronary Given 10/06/2021 1346)  sodium chloride 0.9 % bolus 500 mL (0 mLs Intravenous Stopped 09/14/2021 1132)  fentaNYL (SUBLIMAZE) injection 50 mcg (50 mcg Intravenous Given 09/13/2021 1136)     ED Discharge Orders     None        Note:  This document was prepared using Dragon voice recognition software and may include unintentional dictation errors.   Duffy Bruce, MD 09/29/2021 1352

## 2021-10-07 NOTE — Progress Notes (Signed)
   09/26/2021 1100  Clinical Encounter Type  Visited With Patient and family together  Visit Type Initial;Code  Spiritual Encounters  Spiritual Needs Other (Comment) (social support)  Chaplain Burris responded to a code STEMi. Met Pt and spouse. Provided hospitality and non-anxious presence. Chaplain aimed to support Pt and spouse and to contribute to their overall comfort and to promote a sense of calm and and well-being. Will continue to follow as they move to cath lab later this afternoon or to the floor. 

## 2021-10-07 NOTE — ED Triage Notes (Signed)
EMS brings pt in from home for c/o rt sided CP since awakening at [facility] by diaphoresis & SHOB; 2 SL NTG taken PTA decreasing pain from 8/10 to 6/10; hx card cath

## 2021-10-07 NOTE — Consult Note (Signed)
NAME:  Warren Haff Sr., MRN:  [id number], DOB:  08/29/1951, LOS: 0 ADMISSION DATE:  09/23/2021, CONSULTATION DATE:  09/30/2021 REFERRING MD:  Bensimhon - Adv HF, CHIEF COMPLAINT:  Respiratory failure    History of Present Illness:  70 yo M PMH CAD s/p 2x stents RCA (1996), CVA, HTN, HLD, DM2, prostate cancer who presented to ED 10/28 with R sided chest pain, reportedly beginning 0400 10/28. Later developed SOB, improved with nitro (took nitro at home and again with EMS). In ED chest pain recurred, with associated SOB and diaphoresis, pain radiating down L arm. Found to have NSTEMI, develope respiratory distress and was intubated.  At [facility] he was taken to cath lab and found to have 75% distal LM occlusion, pLAD and Lcx occlusion. He underwent impella supported PCI / DES distal LM through mid LAD. LVEF 25%. Decision made to transfer to [name] for admission.   PCCM is consulted for vent management in setting of respiratory failure requiring intubation   Pertinent  Medical History  CAD HLD HTN DM2 CVA Significant Hospital Events: Including procedures, antibiotic start and stop dates in addition to other pertinent events   10/28 [facility] for chest pain. Taken to cath lab, ruled in for NSTEMI, intubated for resp distress, underwent impella supported PCI/DES with LM to LAD intervention. Transferred to [facility] for cardiogenic shock. PCCM consulted for vent   Interim History / Subjective:  Arrives to [facility] CVICU intubated and on impella   Objective   SpO2 96 %.    Vent Mode: PRVC FiO2 (%):  [70 %-100 %] 70 % Set Rate:  [14 bmp] 14 bmp Vt Set:  [500 mL] 500 mL PEEP:  [8 cmH20] 8 cmH20  No intake or output data in the 24 hours ending 10/01/2021 1833 There were no vitals filed for this visit.  Examination: General: Critically ill, lying in bed HENT: EOMI, no icterus Lungs: Clear, ventilated, coarse Cardiovascular: Tacky, no murmur Abdomen: Nondistended, bowel sounds present Extremities:  Cool, edema present, Swan and Impella site clean dry and intact, bandages in place Neuro: Sedated, does not follow commands  Resolved Hospital Problem list     Assessment & Plan:   Acute respiratory failure with hypoxia Pulmonary Edema P -Full MV support -VAP, PAD -ABG -Wean as able -diuresis per Adv HF, agree with additional dose of Lasix  Cardiogenic Shock NSTEMI CAD  P -per Adv HF  -starting milrinone, on minimal NE -DAPT  -follow coox, renal function, cbc  -Wean Impella per heart failure  HTN HLD -holding home meds  Dm2 -SSI   Best Practice (right click and "Reselect all SmartList Selections" daily)   Per primary  Labs   CBC: Recent Labs  Lab 09/15/2021 0659  WBC 12.3*  HGB 14.4  HCT 44.4  MCV 82.5  PLT 226    Basic Metabolic Panel: Recent Labs  Lab 09/18/2021 0659  NA 138  K 4.0  CL 102  CO2 26  GLUCOSE 122*  BUN 20  CREATININE 1.13  CALCIUM 9.1   GFR: Estimated Creatinine Clearance: 79.7 mL/min (by C-G formula based on SCr of 1.13 mg/dL). Recent Labs  Lab 09/16/2021 0659  WBC 12.3*    Liver Function Tests: No results for input(s): AST, ALT, ALKPHOS, BILITOT, PROT, ALBUMIN in the last 168 hours. No results for input(s): LIPASE, AMYLASE in the last 168 hours. No results for input(s): AMMONIA in the last 168 hours.  ABG    Component Value Date/Time   PHART 7.30 (L)  10/09/2021 1324   PCO2ART 44 10/02/2021 1324   PO2ART 61 (L) 09/12/2021 1324   HCO3 21.6 09/14/2021 1324   ACIDBASEDEF 4.8 (H) 10/06/2021 1324   O2SAT 88.1 10/08/2021 1324     Coagulation Profile: Recent Labs  Lab 10/08/2021 1055  INR 1.2    Cardiac Enzymes: No results for input(s): CKTOTAL, CKMB, CKMBINDEX, TROPONINI in the last 168 hours.  HbA1C: Hgb A1c MFr Bld  Date/Time Value Ref Range Status  04/29/2018 07:30 PM 7.4 (H) 4.8 - 5.6 % Final    Comment:    (NOTE) Pre diabetes:          5.7%-6.4% Diabetes:              >6.4% Glycemic control for    <7.0% adults with diabetes     CBG: Recent Labs  Lab 10/08/2021 1037 10/01/2021 1211  GLUCAP 116* 105*    Review of Systems:   Not obtainable due to patient factors  Past Medical History:  He,  has a past medical history of Arteriosclerosis of coronary artery (10/29/2015), Arthritis, BPH (benign prostatic hyperplasia), Diabetes mellitus without complication ([place]), Gout, Heart attack ([place]) (1996), Hypertension, Prostate cancer ([place]), and Stroke ([place]).   Surgical History:   Past Surgical History:  Procedure Laterality Date   CHOLECYSTECTOMY     COLONOSCOPY N/A 05/01/2018   Procedure: COLONOSCOPY;  Surgeon: Virgel Manifold, MD;  Location: [facility] ENDOSCOPY;  Service: Endoscopy;  Laterality: N/A;   ESOPHAGOGASTRODUODENOSCOPY N/A 05/01/2018   Procedure: ESOPHAGOGASTRODUODENOSCOPY (EGD);  Surgeon: Virgel Manifold, MD;  Location: [facility] ENDOSCOPY;  Service: Endoscopy;  Laterality: N/A;   RIGHT HEART CATHETERIZATION WITH ADENOSINE STUDY  1996     Social History:   reports that he quit smoking about 26 years ago. His smoking use included cigarettes. He has never used smokeless tobacco. He reports that he does not drink alcohol and does not use drugs.   Family History:  His family history is not on file.   Allergies Allergies  Allergen Reactions   Pravachol [Pravastatin Sodium]     Rash   Pravastatin Nausea And Vomiting    Other reaction(s): Unknown   Statins Rash    Pt reports only pravastatin causes rash - takes atorvastatin currently     Home Medications  Prior to Admission medications   Medication Sig Start Date End Date Taking? Authorizing Provider  amLODipine (NORVASC) 5 MG tablet Take 5 mg by mouth daily.  04/11/15   [provider]  Ascorbic Acid (VITAMIN C) 1000 MG tablet Take 1,000 mg by mouth daily.    [provider]  aspirin EC 81 MG tablet Take 81 mg by mouth daily.    [provider]  atorvastatin (LIPITOR) 40 MG tablet Take by mouth.  08/14/20   [provider]  Cholecalciferol (VITAMIN D3) 1000 UNITS CAPS Take 1 capsule by mouth daily.    [provider]  cloNIDine (CATAPRES) 0.1 MG tablet Take 0.1 mg by mouth 2 (two) times daily.  03/30/15   [provider]  colchicine 0.6 MG tablet Take 0.6 mg by mouth daily as needed (gout flare). Reported on 11/25/2015 04/08/15   [provider]  colestipol (COLESTID) 1 g tablet  08/16/19   [provider]  fenofibrate 160 MG tablet Take 160 mg by mouth daily.    [provider]  glipiZIDE (GLUCOTROL XL) 5 MG 24 hr tablet Take 5 mg by mouth daily with breakfast.  03/22/15   [provider]  hydrochlorothiazide (  HYDRODIURIL) 25 MG tablet Take 25 mg by mouth daily.  04/11/15   [provider]  metFORMIN (GLUCOPHAGE-XR) 500 MG 24 hr tablet Take 1,000 mg by mouth daily with supper.  05/14/16   [provider]  metoprolol (TOPROL-XL) 200 MG 24 hr tablet Take 200 mg by mouth daily.  04/11/15   [provider]  Omega-3 Fatty Acids (FISH OIL) 1000 MG CAPS Take 2,000 mg by mouth daily.    [provider]  ondansetron (ZOFRAN ODT) 4 MG disintegrating tablet Take 1 tablet (4 mg total) by mouth every 8 (eight) hours as needed for nausea or vomiting. 05/05/18   Paulette Blanch, MD  pantoprazole (PROTONIX) 20 MG tablet Take 1 tablet (20 mg total) by mouth daily. 04/07/19   Virgel Manifold, MD  Potassium Chloride ER 20 MEQ TBCR  08/16/19   [provider]  potassium chloride SA (K-DUR,KLOR-CON) 20 MEQ tablet Take 20 mEq by mouth daily.  04/11/15   [provider]  ramipril (ALTACE) 10 MG capsule Take 10 mg by mouth daily.  03/30/15   [provider]  vitamin B-12 (CYANOCOBALAMIN) 100 MCG tablet Take 100 mcg by mouth daily.    [provider]     Critical care time:     CRITICAL CARE Performed by: Lanier Clam   Total critical care time: 35 minutes  Critical care time was  exclusive of separately billable procedures and treating other patients.  Critical care was necessary to treat or prevent imminent or life-threatening deterioration.  Critical care was time spent personally by me on the following activities: development of treatment plan with patient and/or surrogate as well as nursing, discussions with consultants, evaluation of patient's response to treatment, examination of patient, obtaining history from patient or surrogate, ordering and performing treatments and interventions, ordering and review of laboratory studies, ordering and review of radiographic studies, pulse oximetry and re-evaluation of patient's condition.

## 2021-10-07 NOTE — ED Notes (Signed)
Called CODE STEMI to [facility] spoke with Chad

## 2021-10-07 NOTE — ED Provider Notes (Signed)
[place]  Department of Emergency Medicine   Code Blue CONSULT NOTE  Chief Complaint: Respiratory Failure  Level V Caveat: Unresponsive  History of present illness: I was contacted by the hospital for respiratory arrest in a patient currently in the cardiac Cath Lab.  Patient in cardiogenic shock and pulmonary edema following admission for NSTEMI requiring urgent cardiac catheterization.  ROS: Unable to obtain, Level V caveat  Scheduled Meds:  etomidate       Continuous Infusions:  sodium chloride Stopped (09/28/2021 1132)   heparin Stopped (09/19/2021 1205)   PRN Meds:.etomidate, fentaNYL, furosemide, Heparin (Porcine) in NaCl, heparin sodium (porcine), lidocaine (PF), midazolam, ondansetron, rocuronium, verapamil Past Medical History:  Diagnosis Date   Arteriosclerosis of coronary artery 10/29/2015   Overview:  with mi    Arthritis    BPH (benign prostatic hyperplasia)    Diabetes mellitus without complication ([place])    Gout    Heart attack ([place]) 1996   Hypertension    Prostate cancer ([place])    Stroke [facility])    Past Surgical History:  Procedure Laterality Date   CHOLECYSTECTOMY     COLONOSCOPY N/A 05/01/2018   Procedure: COLONOSCOPY;  Surgeon: Virgel Manifold, MD;  Location: [facility] ENDOSCOPY;  Service: Endoscopy;  Laterality: N/A;   ESOPHAGOGASTRODUODENOSCOPY N/A 05/01/2018   Procedure: ESOPHAGOGASTRODUODENOSCOPY (EGD);  Surgeon: Virgel Manifold, MD;  Location: [facility] ENDOSCOPY;  Service: Endoscopy;  Laterality: N/A;   RIGHT HEART CATHETERIZATION WITH ADENOSINE STUDY  1996   Social History   Socioeconomic History   Marital status: Married    Spouse name: Not on file   Number of children: Not on file   Years of education: Not on file   Highest education level: Not on file  Occupational History   Not on file  Tobacco Use   Smoking status: Former    Types: Cigarettes    Quit date: 09/15/1995    Years since quitting: 26.0   Smokeless tobacco:  Never  Vaping Use   Vaping Use: Never used  Substance and Sexual Activity   Alcohol use: No    Alcohol/week: 0.0 standard drinks   Drug use: No   Sexual activity: Yes  Other Topics Concern   Not on file  Social History Narrative   Not on file   Social Determinants of Health   Financial Resource Strain: Not on file  Food Insecurity: Not on file  Transportation Needs: Not on file  Physical Activity: Not on file  Stress: Not on file  Social Connections: Not on file  Intimate Partner Violence: Not on file   Allergies  Allergen Reactions   Pravachol [Pravastatin Sodium]     Rash   Pravastatin Nausea And Vomiting    Other reaction(s): Unknown   Statins Rash    Pt reports only pravastatin causes rash - takes atorvastatin currently    Last set of Vital Signs (not current) Vitals:   09/11/2021 1100 09/19/2021 1115  BP: 107/67 99/60  Pulse: 94 92  Resp: (!) 21 (!) 21  Temp:    SpO2: 96% 96%      Physical Exam  Gen: Minimally responsive with gasping respirations. Cardiovascular: Carotid pulse intact. Resp: Poor respiratory effort. Breath sounds equal bilaterally with bagging  Abd: nondistended  Neuro: Opens eyes to voice with one word answers. HEENT: No blood in posterior pharynx.  Neck: No crepitus  Musculoskeletal: No deformity  Skin: warm  Procedures  INTUBATION Performed by: Blake Divine Required items: required blood products, implants,  devices, and special equipment available Patient identity confirmed: provided demographic data and hospital-assigned identification number Time out: Immediately prior to procedure a "time out" was called to verify the correct patient, procedure, equipment, support staff and site/side marked as required. Indications: Acute respiratory failure Intubation method: Video laryngoscopy Preoxygenation: BVM Sedatives: Etomidate Paralytic: Rocuronium Tube Size: 8.0 cuffed Post-procedure assessment: chest rise and ETCO2 monitor Breath  sounds: equal and absent over the epigastrium Tube secured by Respiratory Therapy Patient tolerated the procedure well with no immediate complications.  CRITICAL CARE Performed by: Blake Divine Total critical care time: 15 Critical care time was exclusive of separately billable procedures and treating other patients. Critical care was necessary to treat or prevent imminent or life-threatening deterioration. Critical care was time spent personally by me on the following activities: development of treatment plan with patient and/or surrogate as well as nursing, discussions with consultants, evaluation of patient's response to treatment, examination of patient, obtaining history from patient or surrogate, ordering and performing treatments and interventions, ordering and review of laboratory studies, ordering and review of radiographic studies, pulse oximetry and re-evaluation of patient's condition.    Medical Decision making  70 year old male taken to Cath Lab urgently following admission for NSTEMI, subsequently developed cardiogenic shock and acute respiratory failure.  I was called to the Cath Lab for emergent intubation.  Assessment and Plan  Patient with gasping respirations and O2 sats in the mid 80s on nonrebreather.  Patient given rescue breaths via bag valve mask with minimal improvement in O2 saturations, was subsequently intubated on first attempt following medication with etomidate and rocuronium.  Patient to undergo Impella placement per cardiology team.    Blake Divine, MD 09/14/2021 1325

## 2021-10-07 NOTE — ED Notes (Signed)
IV fluids stopped per MD Ellender Hose Verbal

## 2021-10-07 NOTE — Consult Note (Signed)
Cardiology Consultation:   Patient ID: Warren Roulston Fraticelli Sr. MRN: [id number]; DOB: [birth year]  Admit date: 09/24/2021 Date of Consult: 10/02/2021  PCP:  [name], MD   [facility] Cardiologist:  Dr. Clayborn Bigness   Patient Profile:   Warren Crowson Vavrek Sr. is a 70 y.o. male with a hx of coronary artery disease status post 2 stents placed in the 1990s, hypertension, hyperlipidemia, stroke, type 2 diabetes mellitus, kidney stones, and prostate cancer, who is being seen 09/22/2021 for the evaluation of chest pain and elevated troponin at the request of [name].  History of Present Illness:   Warren Stone reports waking up in a cold sweat around 4:30 this morning.  He also had severe chest and his pain radiating up to the back of his neck.  He rates the pain as 8/10 with minimal improvement with sublingual nitroglycerin.  He subsequently contacted EMS and was given 4 baby aspirin with improvement in the chest pain.  He has continued to have waxing and waning chest pain here in the ED.  Initial EKGs showed subtle anterior ST elevation, most pronounced in V3, that was not felt to be consistent with STEMI criteria.  His chest pain have subsided to 2/10 but has now recrudesce to 4/10.  Serial EKGs have showed subtle variation in his ST elevations though they do not meet STEMI criteria.  He has experienced exertional chest pain and shortness of breath for months, having last been seen by Dr. Clayborn Bigness in mid August.  At that time, he reported some improvement in his exertional angina following escalation of isosorbide mononitrate.   Past Medical History:  Diagnosis Date   Arteriosclerosis of coronary artery 10/29/2015   Overview:  with mi    Arthritis    BPH (benign prostatic hyperplasia)    Diabetes mellitus without complication ([place])    Gout    Heart attack ([place]) 1996   Hypertension    Prostate cancer ([place])    Stroke [facility])     Past Surgical History:  Procedure  Laterality Date   CHOLECYSTECTOMY     COLONOSCOPY N/A 05/01/2018   Procedure: COLONOSCOPY;  Surgeon: Virgel Manifold, MD;  Location: [facility] ENDOSCOPY;  Service: Endoscopy;  Laterality: N/A;   ESOPHAGOGASTRODUODENOSCOPY N/A 05/01/2018   Procedure: ESOPHAGOGASTRODUODENOSCOPY (EGD);  Surgeon: Virgel Manifold, MD;  Location[facility] ENDOSCOPY;  Service: Endoscopy;  Laterality: N/A;   RIGHT HEART CATHETERIZATION WITH ADENOSINE STUDY  1996     Home Medications:  Prior to Admission medications   Medication Sig Start Date Jasey Cortez Date Taking? Authorizing Provider  amLODipine (NORVASC) 5 MG tablet Take 5 mg by mouth daily.  04/11/15   [provider]  Ascorbic Acid (VITAMIN C) 1000 MG tablet Take 1,000 mg by mouth daily.    [provider]  aspirin EC 81 MG tablet Take 81 mg by mouth daily.    [provider]  atorvastatin (LIPITOR) 40 MG tablet Take by mouth. 08/14/20   [provider]  Cholecalciferol (VITAMIN D3) 1000 UNITS CAPS Take 1 capsule by mouth daily.    [provider]  cloNIDine (CATAPRES) 0.1 MG tablet Take 0.1 mg by mouth 2 (two) times daily.  03/30/15   [provider]  colchicine 0.6 MG tablet Take 0.6 mg by mouth daily as needed (gout flare). Reported on 11/25/2015 04/08/15   [provider]  colestipol (COLESTID) 1 g tablet  08/16/19   [provider]  fenofibrate 160 MG tablet Take 160 mg by mouth  daily.    [provider]  glipiZIDE (GLUCOTROL XL) 5 MG 24 hr tablet Take 5 mg by mouth daily with breakfast.  03/22/15   [provider]  hydrochlorothiazide (HYDRODIURIL) 25 MG tablet Take 25 mg by mouth daily.  04/11/15   [provider]  metFORMIN (GLUCOPHAGE-XR) 500 MG 24 hr tablet Take 1,000 mg by mouth daily with supper.  05/14/16   [provider]  metoprolol (TOPROL-XL) 200 MG 24 hr tablet Take 200 mg by mouth daily.  04/11/15   [provider]  Omega-3 Fatty Acids (FISH OIL)  1000 MG CAPS Take 2,000 mg by mouth daily.    [provider]  ondansetron (ZOFRAN ODT) 4 MG disintegrating tablet Take 1 tablet (4 mg total) by mouth every 8 (eight) hours as needed for nausea or vomiting. 05/05/18   Paulette Blanch, MD  pantoprazole (PROTONIX) 20 MG tablet Take 1 tablet (20 mg total) by mouth daily. 04/07/19   Virgel Manifold, MD  Potassium Chloride ER 20 MEQ TBCR  08/16/19   [provider]  potassium chloride SA (K-DUR,KLOR-CON) 20 MEQ tablet Take 20 mEq by mouth daily.  04/11/15   [provider]  ramipril (ALTACE) 10 MG capsule Take 10 mg by mouth daily.  03/30/15   [provider]  vitamin B-12 (CYANOCOBALAMIN) 100 MCG tablet Take 100 mcg by mouth daily.    [provider]    Inpatient Medications: Scheduled Meds:  Continuous Infusions:  sodium chloride Stopped (09/18/2021 1132)   heparin 1,350 Units/hr (09/27/2021 1106)   PRN Meds:   Allergies:    Allergies  Allergen Reactions   Pravachol [Pravastatin Sodium]     Rash   Pravastatin Nausea And Vomiting    Other reaction(s): Unknown   Statins Rash    Pt reports only pravastatin causes rash - takes atorvastatin currently    Social History:   Social History   Tobacco Use   Smoking status: Former    Types: Cigarettes    Quit date: 09/15/1995    Years since quitting: 26.0   Smokeless tobacco: Never  Vaping Use   Vaping Use: Never used  Substance Use Topics   Alcohol use: No    Alcohol/week: 0.0 standard drinks   Drug use: No     Family History:   Mother - CAD, DM, and HTN  ROS:  Please see the history of present illness. All other ROS reviewed and negative.     Physical Exam/Data:   Vitals:   10/04/2021 0918 09/10/2021 1045 09/24/2021 1100 09/21/2021 1115  BP: 95/64 98/69 107/67 99/60  Pulse: 72 90 94 92  Resp: 16 (!) 28 (!) 21 (!) 21  Temp:      TempSrc:      SpO2: 96% 99% 96% 96%  Weight:      Height:       No intake or output data in the 24 hours ending  10/05/2021 1150 Last 3 Weights 09/27/2021 01/14/2021 08/26/2020  Weight (lbs) 246 lb 253 lb 255 lb 3.2 oz  Weight (kg) 111.585 kg 114.76 kg 115.758 kg     Body mass index is 32.46 kg/m.  General: Unwell appearing man, lying on stretcher.  His wife is at the bedside. HEENT: normal Neck: no JVD Vascular: No carotid bruits; Distal pulses 2+ bilaterally Cardiac: Regular rate and rhythm with no murmurs, rubs, or gallops. Lungs: Mildly diminished breath sounds throughout with faint crackles at the left lung base. Abd: soft, nontender, no hepatomegaly  Ext: Trace pretibial edema. Musculoskeletal:  No deformities, BUE and BLE strength normal and equal Skin: warm and dry  Neuro:  CNs 2-12 intact, no focal abnormalities noted Psych:  Normal affect   EKG:  The EKG was personally reviewed and demonstrates: Normal sinus rhythm with first-degree AV block, LVH, and 2 mm of ST elevation isolated to V3.  There is subtle ST elevation in V2 and V4 as well. Telemetry:  Telemetry was personally reviewed and demonstrates: Sinus rhythm with PVCs.  Relevant CV Studies: Pharmacologic MPI (06/07/2021): Large reversible anterior defect.  LVEF 46%.  Laboratory Data:  High Sensitivity Troponin:   Recent Labs  Lab 09/16/2021 0659 09/24/2021 0920  TROPONINIHS 1,296* 7,889*     Chemistry Recent Labs  Lab 10/01/2021 0659  NA 138  K 4.0  CL 102  CO2 26  GLUCOSE 122*  BUN 20  CREATININE 1.13  CALCIUM 9.1  GFRNONAA >60  ANIONGAP 10    No results for input(s): PROT, ALBUMIN, AST, ALT, ALKPHOS, BILITOT in the last 168 hours. Lipids No results for input(s): CHOL, TRIG, HDL, LABVLDL, LDLCALC, CHOLHDL in the last 168 hours.  Hematology Recent Labs  Lab 09/24/2021 0659  WBC 12.3*  RBC 5.38  HGB 14.4  HCT 44.4  MCV 82.5  MCH 26.8  MCHC 32.4  RDW 14.6  PLT 160   Thyroid No results for input(s): TSH, FREET4 in the last 168 hours.  BNP Recent Labs  Lab 09/14/2021 0659  BNP 344.3*    DDimer No results for  input(s): DDIMER in the last 168 hours.   Radiology/Studies:  DG Chest 2 View  Result Date: 09/13/2021 CLINICAL DATA:  Chest pain EXAM: CHEST - 2 VIEW COMPARISON:  04/24/2009 FINDINGS: The heart size and mediastinal contours are within normal limits. Diffuse bilateral interstitial pulmonary opacity. The visualized skeletal structures are unremarkable. IMPRESSION: Diffuse bilateral interstitial pulmonary opacity, consistent with edema or infection. No focal airspace opacity. Electronically Signed   By: Delanna Ahmadi M.D.   On: 09/26/2021 07:25     Assessment and Plan:   High risk NSTEMI: Patient presents with sudden onset of chest pain and EKG findings concerning for acute MI with subtle anterior ST elevation that does not meet STEMI criteria.  Given continued chest pain and significantly elevated troponin, I recommended proceeding with urgent cardiac catheterization and possible PCI.  I discussed this procedure with the patient, his wife, and Dr. Nehemiah Massed, who is covering for Dr. Clayborn Bigness.  We have agreed to proceed.  In the meantime, IV heparin and aspirin should be continued.  I would hold further IV fluids, as the patient has some dyspnea with crackles on lung exam concerning for acute heart failure.  LVEF noted to be mildly reduced on recent myocardial perfusion stress test.  Acute heart failure: Patient with shortness of breath and crackles on lung exam today (NYHA class IV).  BNP also elevated.  LVEF noted to be mildly reduced on stress test earlier this year.  Will he will likely need diuresis, though I will defer that until after initiation cardiac catheterization.  Shared Decision Making/Informed Consent The risks [stroke (1 in 1000), death (1 in 1000), kidney failure [usually temporary] (1 in 500), bleeding (1 in 200), allergic reaction [possibly serious] (1 in 200)], benefits (diagnostic support and management of coronary artery disease) and alternatives of a cardiac catheterization  were discussed in detail with Warren Stone and he is willing to proceed.   For questions or updates, please contact [place] Please  consult [email or web address] for contact info under    Signed, Nelva Bush, MD  10/08/2021 11:50 AM

## 2021-10-07 NOTE — Progress Notes (Addendum)
ANTICOAGULATION CONSULT NOTE - Initial Consult  Pharmacy Consult for heparin Indication:  Impella CP  Allergies  Allergen Reactions   Pravachol [Pravastatin Sodium]     Rash   Pravastatin Nausea And Vomiting    Other reaction(s): Unknown   Statins Rash    Pt reports only pravastatin causes rash - takes atorvastatin currently    Patient Measurements:   Heparin Dosing Weight: 103.4 kg   Vital Signs: Temp: 97.8 F (36.6 C) (10/28 0650) Temp Source: Oral (10/28 0650) BP: 160/100 (10/28 1544) Pulse Rate: 113 (10/28 1544)  Labs: Recent Labs    09/15/2021 0659 09/21/2021 0920 09/23/2021 1055  HGB 14.4  --   --   HCT 44.4  --   --   PLT 160  --   --   APTT  --   --  31  LABPROT  --   --  15.1  INR  --   --  1.2  CREATININE 1.13  --   --   TROPONINIHS 1,296* 7,889*  --     Estimated Creatinine Clearance: 79.7 mL/min (by C-G formula based on SCr of 1.13 mg/dL).   Medical History: Past Medical History:  Diagnosis Date   Arteriosclerosis of coronary artery 10/29/2015   Overview:  with mi    Arthritis    BPH (benign prostatic hyperplasia)    Diabetes mellitus without complication ([place])    Gout    Heart attack ([place]) 1996   Hypertension    Prostate cancer ([place])    Stroke ([place])     Medications:  Scheduled:   [START ON 10/08/2021] aspirin  81 mg Oral Daily   [START ON 10/08/2021] atorvastatin  80 mg Oral Daily   docusate  100 mg Per Tube BID   fentaNYL (SUBLIMAZE) injection  25 mcg Intravenous Once   polyethylene glycol  17 g Per Tube Daily   sodium chloride flush  3 mL Intravenous Q12H   [START ON 10/08/2021] ticagrelor  90 mg Oral BID    Assessment: 84 yom presenting to [facility] this afternoon - found to have NSTEMI now s/p impella supported PCI with DES to distal LM through mid LAD. Had respiratory failure, requiring intubation during cath, also found to be in cardiogenic shock - transferred to [facility] No AC PTA.   Impella CP placed in cath lab - currently running at P7,  purge flow rate 10.7 mL/hr, purge pressure 400s. Small hematoma where TR band was and slightly bloody urine- monitoring. Hgb 14.4, plt 160 earlier.   Goal of Therapy:  Heparin level 0.2-0.5 units/ml once systemic heparin started Monitor platelets by anticoagulation protocol: Yes   Plan:  Will start heparin purge solution (50 units/mL concentration) Will check ACT hourly -once ACT<160, can start systemic heparin infusion Will monitor heparin levels every 6-8 hours  Monitor daily HL, CBC, and for s/sx of bleeding Monitor Impella weaning    Antonietta Jewel, PharmD, BCCCP Clinical Pharmacist  Phone: [phone] 09/20/2021 6:26 PM  Please check AMION for all [place] phone numbers After 10:00 PM, call [place] [phone]   ADDENDUM ACT came back at 121, on heparin purge - receiving 530 units/hr from purge solution. TR band still inflated with air - hematoma slightly larger than upon arrival to unit (RN monitoring closely), urine went from pink tinted to more bloody. Discussed with cardiology fellow, will hold on systemic heparin infusion at this time until able to get TR band off and ensure bleeding stable. Continue heparin purge solution - will HL  with AM labs unless systemic heparin is starting prior to that timing.  Antonietta Jewel, PharmD, [place] Clinical Pharmacist  Phone: [phone] 09/14/2021 10:21 PM  Please check AMION for all [place] phone numbers After 10:00 PM, call [place] [phone]

## 2021-10-07 NOTE — Interval H&P Note (Signed)
History and Physical Interval Note:  09/26/2021 12:03 PM  Warren Cera Madry Sr.  has presented today for surgery, with the diagnosis of NSTEMI.  The various methods of treatment have been discussed with the patient and family. After consideration of risks, benefits and other options for treatment, the patient has consented to  Procedure(s): LEFT HEART CATH AND CORONARY ANGIOGRAPHY (N/A) as a surgical intervention.  The patient's history has been reviewed, patient examined, no change in status, stable for surgery.  I have reviewed the patient's chart and labs.  Questions were answered to the patient's satisfaction.    Cath Lab Visit (complete for each Cath Lab visit)  Clinical Evaluation Leading to the Procedure:   ACS: Yes.    Non-ACS:  N/A  [name]

## 2021-10-08 ENCOUNTER — Inpatient Hospital Stay (HOSPITAL_COMMUNITY): Payer: Medicare Other

## 2021-10-08 DIAGNOSIS — Z9911 Dependence on respirator [ventilator] status: Secondary | ICD-10-CM | POA: Diagnosis not present

## 2021-10-08 DIAGNOSIS — J810 Acute pulmonary edema: Secondary | ICD-10-CM

## 2021-10-08 DIAGNOSIS — I5021 Acute systolic (congestive) heart failure: Secondary | ICD-10-CM

## 2021-10-08 DIAGNOSIS — J9601 Acute respiratory failure with hypoxia: Secondary | ICD-10-CM

## 2021-10-08 DIAGNOSIS — R570 Cardiogenic shock: Secondary | ICD-10-CM | POA: Diagnosis not present

## 2021-10-08 DIAGNOSIS — N179 Acute kidney failure, unspecified: Secondary | ICD-10-CM

## 2021-10-08 LAB — BASIC METABOLIC PANEL
Anion gap: 9 (ref 5–15)
Anion gap: 10 (ref 5–15)
BUN: 28 mg/dL — ABNORMAL HIGH (ref 8–23)
BUN: 36 mg/dL — ABNORMAL HIGH (ref 8–23)
CO2: 23 mmol/L (ref 22–32)
CO2: 25 mmol/L (ref 22–32)
Calcium: 8.4 mg/dL — ABNORMAL LOW (ref 8.9–10.3)
Calcium: 8.5 mg/dL — ABNORMAL LOW (ref 8.9–10.3)
Chloride: 106 mmol/L (ref 98–111)
Chloride: 103 mmol/L (ref 98–111)
Creatinine, Ser: 1.43 mg/dL — ABNORMAL HIGH (ref 0.61–1.24)
Creatinine, Ser: 2.12 mg/dL — ABNORMAL HIGH (ref 0.61–1.24)
GFR, Estimated: 53 mL/min — ABNORMAL LOW (ref >60)
GFR, Estimated: 33 mL/min — ABNORMAL LOW (ref >60)
Glucose, Bld: 182 mg/dL — ABNORMAL HIGH (ref 70–99)
Glucose, Bld: 194 mg/dL — ABNORMAL HIGH (ref 70–99)
Potassium: 4.3 mmol/L (ref 3.5–5.1)
Potassium: 4.2 mmol/L (ref 3.5–5.1)
Sodium: 138 mmol/L (ref 135–145)
Sodium: 138 mmol/L (ref 135–145)

## 2021-10-08 LAB — GLUCOSE, CAPILLARY
Glucose-Capillary: 150 mg/dL — ABNORMAL HIGH (ref 70–99)
Glucose-Capillary: 153 mg/dL — ABNORMAL HIGH (ref 70–99)
Glucose-Capillary: 164 mg/dL — ABNORMAL HIGH (ref 70–99)
Glucose-Capillary: 167 mg/dL — ABNORMAL HIGH (ref 70–99)
Glucose-Capillary: 170 mg/dL — ABNORMAL HIGH (ref 70–99)
Glucose-Capillary: 180 mg/dL — ABNORMAL HIGH (ref 70–99)
Glucose-Capillary: 192 mg/dL — ABNORMAL HIGH (ref 70–99)

## 2021-10-08 LAB — COOXEMETRY PANEL
Carboxyhemoglobin: 1 % (ref 0.5–1.5)
Carboxyhemoglobin: 1.1 % (ref 0.5–1.5)
Methemoglobin: 1.3 % (ref 0.0–1.5)
Methemoglobin: 1.1 % (ref 0.0–1.5)
O2 Saturation: 78.1 %
O2 Saturation: 85.4 %
Total hemoglobin: 13 g/dL (ref 12.0–16.0)
Total hemoglobin: 13 g/dL (ref 12.0–16.0)

## 2021-10-08 LAB — CBC
HCT: 41 % (ref 39.0–52.0)
Hemoglobin: 13.2 g/dL (ref 13.0–17.0)
MCH: 27 pg (ref 26.0–34.0)
MCHC: 32.2 g/dL (ref 30.0–36.0)
MCV: 83.8 fL (ref 80.0–100.0)
Platelets: 172 10*3/uL (ref 150–400)
RBC: 4.89 MIL/uL (ref 4.22–5.81)
RDW: 14.6 % (ref 11.5–15.5)
WBC: 14.8 10*3/uL — ABNORMAL HIGH (ref 4.0–10.5)
nRBC: 0 % (ref 0.0–0.2)

## 2021-10-08 LAB — PHOSPHORUS
Phosphorus: 4.2 mg/dL (ref 2.5–4.6)
Phosphorus: 4.3 mg/dL (ref 2.5–4.6)

## 2021-10-08 LAB — HEPARIN LEVEL (UNFRACTIONATED): Heparin Unfractionated: <0.1 IU/mL — ABNORMAL LOW (ref 0.30–0.70)

## 2021-10-08 LAB — MAGNESIUM: Magnesium: 1.6 mg/dL — ABNORMAL LOW (ref 1.7–2.4)

## 2021-10-08 LAB — ECHOCARDIOGRAM COMPLETE
Area-P 1/2: 4.49 cm2
Calc EF: 42.3 %
Height: 73 in
S' Lateral: 3 cm
Single Plane A2C EF: 32.2 %
Single Plane A4C EF: 51.1 %
Weight: 3968.28 oz

## 2021-10-08 LAB — LACTIC ACID, PLASMA: Lactic Acid, Venous: 1.2 mmol/L (ref 0.5–1.9)

## 2021-10-08 LAB — LACTATE DEHYDROGENASE: LDH: 1177 U/L — ABNORMAL HIGH (ref 98–192)

## 2021-10-08 MED ORDER — VITAL HIGH PROTEIN PO LIQD: 1000.0000 mL | ORAL | Status: DC

## 2021-10-08 MED ORDER — VITAL HIGH PROTEIN PO LIQD
1000.0000 mL | ORAL | Status: DC
  Administered 2021-10-08 – 2021-10-11 (×4): 1000 mL

## 2021-10-08 MED ORDER — ASPIRIN 81 MG PO CHEW
81.0000 mg | CHEWABLE_TABLET | Freq: Every day | ORAL | Status: DC
  Administered 2021-10-08 – 2021-10-16 (×9): 81 mg
  Filled 2021-10-08 (×9): qty 1

## 2021-10-08 MED ORDER — MAGNESIUM SULFATE 4 GM/100ML IV SOLN
4.0000 g | Freq: Once | INTRAVENOUS | Status: AC
  Administered 2021-10-08: 4 g via INTRAVENOUS
  Filled 2021-10-08: qty 100

## 2021-10-08 MED ORDER — MAGNESIUM SULFATE 2 GM/50ML IV SOLN: 2.0000 g | Freq: Once | INTRAVENOUS | Status: DC

## 2021-10-08 MED ORDER — ENOXAPARIN SODIUM 40 MG/0.4ML IJ SOSY: 40.0000 mg | PREFILLED_SYRINGE | INTRAMUSCULAR | Status: DC

## 2021-10-08 MED ORDER — DIGOXIN 125 MCG PO TABS
0.1250 mg | ORAL_TABLET | Freq: Every day | ORAL | Status: DC
  Administered 2021-10-08: 0.125 mg
  Filled 2021-10-08: qty 1

## 2021-10-08 MED ORDER — PANTOPRAZOLE SODIUM 40 MG IV SOLR
40.0000 mg | INTRAVENOUS | Status: DC
  Administered 2021-10-08 – 2021-10-09 (×2): 40 mg via INTRAVENOUS
  Filled 2021-10-08 (×2): qty 40

## 2021-10-08 MED ORDER — PROSOURCE TF PO LIQD
45.0000 mL | Freq: Three times a day (TID) | ORAL | Status: DC
  Administered 2021-10-08 – 2021-10-11 (×10): 45 mL
  Filled 2021-10-08 (×10): qty 45

## 2021-10-08 MED ORDER — AMIODARONE HCL IN DEXTROSE 360-4.14 MG/200ML-% IV SOLN: 30.0000 mg/h | INTRAVENOUS | Status: DC

## 2021-10-08 MED ORDER — AMIODARONE HCL IN DEXTROSE 360-4.14 MG/200ML-% IV SOLN
INTRAVENOUS | Status: AC
  Administered 2021-10-08: 30 mg/h via INTRAVENOUS
  Filled 2021-10-08: qty 200

## 2021-10-08 MED ORDER — TICAGRELOR 90 MG PO TABS
90.0000 mg | ORAL_TABLET | Freq: Two times a day (BID) | ORAL | Status: DC
  Administered 2021-10-08 – 2021-10-16 (×16): 90 mg
  Filled 2021-10-08 (×16): qty 1

## 2021-10-08 MED ORDER — ATORVASTATIN CALCIUM 80 MG PO TABS
80.0000 mg | ORAL_TABLET | Freq: Every day | ORAL | Status: DC
  Administered 2021-10-08 – 2021-10-16 (×9): 80 mg
  Filled 2021-10-08 (×9): qty 1

## 2021-10-08 MED ORDER — AMIODARONE HCL IN DEXTROSE 360-4.14 MG/200ML-% IV SOLN
30.0000 mg/h | INTRAVENOUS | Status: DC
  Administered 2021-10-08 – 2021-10-09 (×2): 30 mg/h via INTRAVENOUS
  Filled 2021-10-08 (×2): qty 200

## 2021-10-08 NOTE — Progress Notes (Addendum)
Patient ID: Warren Rezabek Ishibashi Sr., male   DOB: 1951/11/26, 70 y.o.   MRN: [id number]     Advanced Heart Failure Rounding Note  PCP-Cardiologist: None   Subjective:    Sedated on vent this morning.   Runs of NSVT noted on monitor.  Currently ST 100s-110s.    MAP stable on NE 2, milrinone 0.125, Impella P8.  Creatinine mildly higher 1.43.   Impella: P8 Flow 3.6 L/min Heparin gtt  Swan numbers: RA 4 PA 31/22 CI 2.8 Co-ox 78%  CXR: Mild perihilar edema.   Objective:   Weight Range: 112.5 kg Body mass index is 32.72 kg/m.   Vital Signs:   Temp:  [98.4 F (36.9 C)-99.9 F (37.7 C)] 99.5 F (37.5 C) (10/29 0800) Pulse Rate:  [72-116] 113 (10/29 0800) Resp:  [11-28] 15 (10/29 0800) BP: (79-162)/(60-106) 116/75 (10/29 0800) SpO2:  [91 %-100 %] 100 % (10/29 0800) FiO2 (%):  [70 %-100 %] 70 % (10/29 0458) Weight:  [112.5 kg-114.2 kg] 112.5 kg (10/29 0348) Last BM Date: 10/06/21 (per report)  Weight change: Filed Weights   09/28/2021 2255 09/29/2021 2300 10/08/21 0348  Weight: 114.2 kg 114.2 kg 112.5 kg    Intake/Output:   Intake/Output Summary (Last 24 hours) at 10/08/2021 0830 Last data filed at 10/08/2021 0700 Gross per 24 hour  Intake 911.33 ml  Output 930 ml  Net -18.67 ml      Physical Exam    General:  NAD HEENT: Normal Neck: Supple. JVP not elevated. Carotids 2+ bilat; no bruits. No lymphadenopathy or thyromegaly appreciated. Cor: PMI nondisplaced. Regular rate & rhythm. No rubs, gallops or murmurs. Lungs: Clear Abdomen: Soft, nontender, nondistended. No hepatosplenomegaly. No bruits or masses. Good bowel sounds. Extremities: No cyanosis, clubbing, rash, edema Neuro: Sedated on vent.    Telemetry   Sinus tachy 100s-110s, NSVT runs (personally reviewed).   Labs    CBC Recent Labs    09/18/2021 0659 10/02/2021 1933 10/08/21 0409  WBC 12.3*  --  14.8*  HGB 14.4 14.6 13.2  HCT 44.4 43.0 41.0  MCV 82.5  --  83.8  PLT 160  --  297   Basic  Metabolic Panel Recent Labs    09/30/2021 2111 10/08/21 0409  NA 137 138  K 4.6 4.3  CL 105 106  CO2 23 23  GLUCOSE 172* 182*  BUN 24* 28*  CREATININE 1.24 1.43*  CALCIUM 8.2* 8.4*  MG 1.8  --    Liver Function Tests No results for input(s): AST, ALT, ALKPHOS, BILITOT, PROT, ALBUMIN in the last 72 hours. No results for input(s): LIPASE, AMYLASE in the last 72 hours. Cardiac Enzymes No results for input(s): CKTOTAL, CKMB, CKMBINDEX, TROPONINI in the last 72 hours.  BNP: BNP (last 3 results) Recent Labs    09/25/2021 0659  BNP 344.3*    ProBNP (last 3 results) No results for input(s): PROBNP in the last 8760 hours.   D-Dimer No results for input(s): DDIMER in the last 72 hours. Hemoglobin A1C Recent Labs    10/06/2021 2025  HGBA1C 6.1*   Fasting Lipid Panel No results for input(s): CHOL, HDL, LDLCALC, TRIG, CHOLHDL, LDLDIRECT in the last 72 hours. Thyroid Function Tests No results for input(s): TSH, T4TOTAL, T3FREE, THYROIDAB in the last 72 hours.  Invalid input(s): FREET3  Other results:   Imaging    DG Chest 1 View  Result Date: 10/08/2021 CLINICAL DATA:  Intubation EXAM: CHEST  1 VIEW COMPARISON:  09/29/2021 FINDINGS: Endotracheal tube terminates 4.5 cm  above the carina. Cardiomegaly. Very mild perihilar edema is possible. No definite pleural effusions. No pleural effusion or pneumothorax. Suspected inferior post Swan-Ganz catheter terminating in the right main pulmonary artery. Defibrillator pad overlying the left hemithorax. Enteric tube courses into the stomach. IMPRESSION: Endotracheal tube terminates 4.5 cm above the carina. Possible mild perihilar edema. Additional support apparatus as above. Electronically Signed   By: Julian Hy M.D.   On: 10/08/2021 03:50   CARDIAC CATHETERIZATION  Addendum Date: 10/06/2021   Conclusion: Severe two-vessel coronary artery disease with 75% distal LMCA stenosis, as well as occlusions of proximal LAD and ostial  LCx.  Mild-moderate disease also noted in ramus intermedius and RCA/PDA. Severely reduced LVEF and severely elevated left ventricular filling pressure in the setting of cardiogenic shock and acute decompensated heart failure. Severely elevated right heart and pulmonary artery pressures. Mildly reduced Fick cardiac output/index with Impella CP in place (suspect cardiac output would be severely reduced without hemodynamic support). Marked respiratory distress prior to PCI and Impella insertion requiring emergent intubation. Successful Impella supported PCI to distal LMCA through mid LAD using Onyx Frontier 2.75 x 38 mm drug-eluting stent with 0% residual stenosis and TIMI-3 flow. Recommendations: Continue dual antiplatelet therapy with aspirin and ticagrelor for at least 12 months. Discontinue cangrelor infusion 2 hours after ticagrelor load was given. Transfer to [name] for ongoing management/weaning of pressors and Impella per advanced heart failure service. Aggressive secondary prevention of coronary artery disease. Nelva Bush, MD [facility]  Result Date: 10/08/2021 Conclusion: Severe two-vessel coronary artery disease with 75% distal LMCA stenosis, as well as occlusions of proximal LAD and ostial LCx.  Mild-moderate disease also noted in ramus intermedius and RCA/PDA. Severely reduced LVEF and severely elevated left ventricular filling pressure. Marked respiratory distress prior to PCI and Impella insertion requiring emergent intubation. Successful Impella supported PCI to distal LMCA through mid LAD using Onyx Frontier 2.75 x 38 mm drug-eluting stent with 0% residual stenosis and TIMI-3 flow. Recommendations: Continue dual antiplatelet therapy with aspirin and ticagrelor for at least 12 months. Discontinue cangrelor infusion 2 hours after ticagrelor load was given. Transfer to [name] for ongoing management/weaning of pressors and Impella per advanced heart failure service. Aggressive secondary  prevention of coronary artery disease. Nelva Bush, MD [facility]    Medications:     Scheduled Medications:  aspirin  81 mg Per Tube Daily   atorvastatin  80 mg Per Tube Daily   chlorhexidine gluconate (MEDLINE KIT)  15 mL Mouth Rinse BID   Chlorhexidine Gluconate Cloth  6 each Topical Daily   digoxin  0.125 mg Per Tube Daily   docusate  100 mg Per Tube BID   fentaNYL (SUBLIMAZE) injection  25 mcg Intravenous Once   insulin aspart  0-15 Units Subcutaneous Q4H   mouth rinse  15 mL Mouth Rinse 10 times per day   mupirocin ointment  1 application Nasal BID   polyethylene glycol  17 g Per Tube Daily   sodium chloride flush  10-40 mL Intracatheter Q12H   sodium chloride flush  3 mL Intravenous Q12H   ticagrelor  90 mg Per Tube BID    Infusions:  sodium chloride 10 mL/hr at 10/08/21 0700   amiodarone     fentaNYL infusion INTRAVENOUS 100 mcg/hr (10/08/21 0700)   heparin 50 units/mL (Impella PURGE) in dextrose 5 % 1000 mL bag     midazolam 1.5 mg/hr (10/08/21 0700)   milrinone 0.125 mcg/kg/min (10/08/21 0700)   norepinephrine (LEVOPHED) Adult  infusion 2 mcg/min (10/08/21 0700)    PRN Medications: sodium chloride, fentaNYL, midazolam, sodium chloride flush, sodium chloride flush   Assessment/Plan   1. CAD: Prior inferior MI and placement of 2 stents to RCA in 1996.  Admitted with NSTEMI, found to have 75% distal LM stenosis with occluded proximal LAD and ostial LCx.  S/p PCI distal LM through mid LAD, ostial LCx remains occluded.  - ASA + Brilinta.  - Atorvastatin.  2. Acute systolic CHF/cardiogenic shock: Ischemic cardiomyopathy.  Bedside echo with EF 20% range.  Impella placed in cath lab with cardiogenic shock.  Currently with Impella CP at P8, no alarms.  Position stable on echo yesterday.  CI 2.8, co-ox 78%.  CVP 4.  He remains on NE 2, milrinone 0.125. MAP stable.  - Wean off NE this morning.  - Continue milrinone.  - digoxin 0.125 - Slow wean of Impella after NE  off.  Continue heparin gtt for Impella. Follow LDH.  - Echo today.  - Does not need diuretic.  3. NSVT/PVCs: Frequent overnight though seems to have settled down.    - Will start amiodarone gtt for now.  4. Acute hypoxemic respiratory failure: CXR today mild peri-hilar edema.  - Per CCM.  5. Type 2 diabetes: SSI.   CRITICAL CARE Performed by: Loralie Champagne  Total critical care time: 40 minutes  Critical care time was exclusive of separately billable procedures and treating other patients.  Critical care was necessary to treat or prevent imminent or life-threatening deterioration.  Critical care was time spent personally by me on the following activities: development of treatment plan with patient and/or surrogate as well as nursing, discussions with consultants, evaluation of patient's response to treatment, examination of patient, obtaining history from patient or surrogate, ordering and performing treatments and interventions, ordering and review of laboratory studies, ordering and review of radiographic studies, pulse oximetry and re-evaluation of patient's condition.   Length of Stay: 1  Loralie Champagne, MD  10/08/2021, 8:30 AM  Advanced Heart Failure Team Pager [phone] (M-F; 7a - 5p)  Please contact [facility] for night-coverage after hours (5p -7a ) and weekends on [email or web address]

## 2021-10-08 NOTE — Progress Notes (Signed)
*  PRELIMINARY RESULTS* Echocardiogram 2D Echocardiogram has been performed.  Warren Stone 10/08/2021, 2:39 PM

## 2021-10-08 NOTE — Progress Notes (Signed)
Dr Marcelle Smiling, cards fellow, at bedside assessing swan d/t decr waveform, cm marking unchanged, draws back blood.  MD reviewed am chest xray and able to obtain wedge.

## 2021-10-08 NOTE — Progress Notes (Signed)
Patient ID: Warren Vandyken Hochmuth Sr., male   DOB: Jun 11, 1951, 70 y.o.   MRN: [id number]  Echo reviewed, very difficult images but Impella catheter appeared shallow.  LDH was high, hard to know if this is from hemolysis or from MI.  No alarms and good flow, no ectopy.  With Dr. Gasper Sells imaging, I advanced the Impella catheter about 1.5-2 cm.  Impella turned down to P6 give good cardiac output.  Will send BMET.   Loralie Champagne 10/08/2021 5:46 PM

## 2021-10-08 NOTE — Plan of Care (Signed)
  Problem: Cardiac: Goal: Ability to achieve and maintain adequate cardiopulmonary perfusion will improve Outcome: Progressing   Problem: Clinical Measurements: Goal: Will remain free from infection Outcome: Progressing   Problem: Coping: Goal: Level of anxiety will decrease Outcome: Progressing   Intubated and sedated, follows simple commands, nods yes/no, explaining care but unable to verify understanding.  Problem: Education: Goal: Knowledge of General Education information will improve Description: Including pain rating scale, medication(s)/side effects and non-pharmacologic comfort measures Outcome: Not Progressing

## 2021-10-08 NOTE — Progress Notes (Signed)
Versed and Fentanyl bags/tubing changed to move infusion from PIV to central access (R femoral venous sheath).   Wasted Versed 60 mL and Fentanyl 200 mL in stericycle with Jalene Mullet RN [phone])

## 2021-10-08 NOTE — Progress Notes (Addendum)
R radial hematoma stable. Successfully removed air from TR band and band now removed, no bleeding, gauze/tegaderm applied. Extremity warm, doppler pulses.   Continues to have bloody urine (clearing slightly), OGT output noted to have some blood in it. Impella purge fluid with heparin 50 u/ml, no change in purge pressures or volume/rate.  Per cards fellow, systemic heparin has not been started yet d/t hematoma/bloody urine. Discussed with pharmacist who will update cards fellow.   Per Cards fellow, continue holding systemic heparin gtt at this time.

## 2021-10-08 NOTE — Progress Notes (Signed)
ANTICOAGULATION CONSULT NOTE - Follow Up Consult  Pharmacy Consult for heparin Indication:  Impella CP  Allergies  Allergen Reactions   Pravachol [Pravastatin Sodium]     Rash   Pravastatin Nausea And Vomiting    Other reaction(s): Unknown   Statins Rash    Pt reports only pravastatin causes rash - takes atorvastatin currently    Patient Measurements: Height: '6\' 1"'  (185.4 cm) Weight: 112.5 kg (248 lb 0.3 oz) IBW/kg (Calculated) : 79.9 Heparin Dosing Weight: 103.4 kg   Vital Signs: Temp: 99.5 F (37.5 C) (10/29 1100) Temp Source: Core (10/29 0400) BP: 91/53 (10/29 1100) Pulse Rate: 104 (10/29 1100)  Labs: Recent Labs    09/12/2021 0659 09/12/2021 0920 09/23/2021 1055 10/03/2021 1933 10/05/2021 2111 10/08/21 0409  HGB 14.4  --   --  14.6  --  13.2  HCT 44.4  --   --  43.0  --  41.0  PLT 160  --   --   --   --  172  APTT  --   --  31  --   --   --   LABPROT  --   --  15.1  --   --   --   INR  --   --  1.2  --   --   --   HEPARINUNFRC  --   --   --   --   --  <0.10*  CREATININE 1.13  --   --   --  1.24 1.43*  TROPONINIHS 1,296* 7,889*  --   --   --   --      Estimated Creatinine Clearance: 63.2 mL/min (A) (by C-G formula based on SCr of 1.43 mg/dL (H)).   Medical History: Past Medical History:  Diagnosis Date   Arteriosclerosis of coronary artery 10/29/2015   Overview:  with mi    Arthritis    BPH (benign prostatic hyperplasia)    Diabetes mellitus without complication ([facility])    Gout    Heart attack ([place]) 1996   Hypertension    Prostate cancer ([place])    Stroke ([place])     Medications:  Scheduled:   aspirin  81 mg Per Tube Daily   atorvastatin  80 mg Per Tube Daily   chlorhexidine gluconate (MEDLINE KIT)  15 mL Mouth Rinse BID   Chlorhexidine Gluconate Cloth  6 each Topical Daily   digoxin  0.125 mg Per Tube Daily   docusate  100 mg Per Tube BID   feeding supplement (PROSource TF)  45 mL Per Tube TID   fentaNYL (SUBLIMAZE) injection  25 mcg Intravenous Once    insulin aspart  0-15 Units Subcutaneous Q4H   mouth rinse  15 mL Mouth Rinse 10 times per day   mupirocin ointment  1 application Nasal BID   pantoprazole (PROTONIX) IV  40 mg Intravenous Q24H   polyethylene glycol  17 g Per Tube Daily   sodium chloride flush  10-40 mL Intracatheter Q12H   sodium chloride flush  3 mL Intravenous Q12H   ticagrelor  90 mg Per Tube BID    Assessment: 11 yom presenting to [facility] - found to have NSTEMI now s/p impella supported PCI with DES to distal LM through mid LAD. Had respiratory failure, requiring intubation during cath, also found to be in cardiogenic shock - transferred to [facility]. No AC PTA.   Impella CP placed in cath lab - currently running at P7, purge heparin 50uts/ml with purge flow rate 11  mL/hr = heparin 550 uts/hr  purge pressure 400s. Small hematoma where TR band was and slightly bloody urine- monitoring. Hgb 14.4, plt 170s initial LDH 1170 post MI - watch trend, heparin level < 0.1  Holding systemic heparin for now   Goal of Therapy:  Heparin level 0.2-0.5 units/ml once systemic heparin started Monitor platelets by anticoagulation protocol: Yes   Plan:  Continue heparin purge solution (50 units/mL concentration) Monitor daily Heparin level , CBC, LDH and for s/sx of bleeding Monitor Impella weaning     Bonnita Nasuti Pharm.D. CPP, BCPS Clinical Pharmacist [phone] 10/08/2021 12:22 PM   Please check AMION for all [place] phone numbers After 10:00 PM, call [place] [phone]

## 2021-10-08 NOTE — Progress Notes (Addendum)
Initial Nutrition Assessment  DOCUMENTATION CODES:   Obesity unspecified  INTERVENTION:  Initiate TF via NGT with Vital High Protein at goal rate of 60 ml/h (1440 ml per day)   Prosource TF 45 ml TID per tube.   Tube feeding regimen to provide 1560 kcals, 159 gm protein, 1210 ml free water daily.  NUTRITION DIAGNOSIS:   Inadequate oral intake related to inability to eat as evidenced by NPO status.  GOAL:   Provide needs based on ASPEN/SCCM guidelines  MONITOR:   Vent status, TF tolerance, Skin, Weight trends, Labs, I & O's  REASON FOR ASSESSMENT:   Consult Enteral/tube feeding initiation and management  ASSESSMENT:   70 y.o. male with history of CAD s/p MI with placement of 2 stents in RCA, prostate cancer, HTN, DM type II, HLD, hx CVA. Presents with complaints of chest pressure and dyspnea. Admitted with NSTEMI complicated by cardiogenic shock and acute systolic HF. Underwent impella.  Patient is currently intubated on ventilator support MV: 9.5 L/min Temp (24hrs), Avg:99.4 F (37.4 C), Min:98.4 F (36.9 C), Max:99.9 F (37.7 C)  RD consulted for tube feeding initiation and management. NGT in place. RD to order tube feeds. Per MD, hopeful can wean support over the next few days, if not consider durable VAD. Unable to complete Nutrition-Focused physical exam at this time.   Labs and medications reviewed.  Diet Order:   Diet Order             Diet NPO time specified  Diet effective now                   EDUCATION NEEDS:   Not appropriate for education at this time  Skin:  Skin Assessment: Reviewed RN Assessment  Last BM:  10/27  Height:   Ht Readings from Last 1 Encounters:  09/17/2021 6\' 1"  (1.854 m)    Weight:   Wt Readings from Last 1 Encounters:  10/08/21 112.5 kg   BMI:  Body mass index is 32.72 kg/m.  Estimated Nutritional Needs:   Kcal:  1600  Protein:  145-165 grams  Fluid:  >/= 1.6 L/day  Corrin Parker, MS, RD, LDN RD  pager number/after hours weekend pager number on Amion.

## 2021-10-08 NOTE — Progress Notes (Signed)
NAME:  Warren Fontenette Sr., MRN:  [id number], DOB:  [birth year], LOS: 1 ADMISSION DATE:  09/30/2021, CONSULTATION DATE:  10/10/2021 REFERRING MD:  Bensimhon - Adv HF, CHIEF COMPLAINT:  Respiratory failure    History of Present Illness:  70 yo M PMH CAD s/p 2x stents RCA (1996), CVA, HTN, HLD, DM2, prostate cancer who presented to ED 10/28 with R sided chest pain, reportedly beginning 0400 10/28. Later developed SOB, improved with nitro (took nitro at home and again with EMS). In ED chest pain recurred, with associated SOB and diaphoresis, pain radiating down L arm. Found to have NSTEMI, develope respiratory distress and was intubated.  At [facility] he was taken to cath lab and found to have 75% distal LM occlusion, pLAD and Lcx occlusion. He underwent impella supported PCI / DES distal LM through mid LAD. LVEF 25%. Decision made to transfer to [name] for admission.   PCCM is consulted for vent management in setting of respiratory failure requiring intubation   Pertinent  Medical History  CAD HLD HTN DM2 CVA Significant Hospital Events: Including procedures, antibiotic start and stop dates in addition to other pertinent events   10/28 [facility] for chest pain. Taken to cath lab, ruled in for NSTEMI, intubated for resp distress, underwent impella supported PCI/DES with LM to LAD intervention. Transferred to [facility] for cardiogenic shock. PCCM consulted for vent   Interim History / Subjective:  Doing well this morning, able to follow commands. Weaning off norepi. Remains on P-8.  Objective   Blood pressure 104/73, pulse (!) 113, temperature 99.5 F (37.5 C), resp. rate 15, height 6\' 1"  (1.854 m), weight 112.5 kg, SpO2 100 %. PAP: (32-67)/(19-42) 32/23 CVP:  [7 mmHg-18 mmHg] 8 mmHg CO:  [4.7 L/min-7.7 L/min] 7.7 L/min CI:  [2 L/min/m2-3.3 L/min/m2] 3.3 L/min/m2  Vent Mode: PRVC FiO2 (%):  [70 %-100 %] 70 % Set Rate:  [14 bmp] 14 bmp Vt Set:  [500 mL] 500 mL PEEP:  [8 cmH20] 8 cmH20 Plateau  Pressure:  [9 cmH20-10 cmH20] 9 cmH20   Intake/Output Summary (Last 24 hours) at 10/08/2021 0704 Last data filed at 10/08/2021 0700 Gross per 24 hour  Intake 900.83 ml  Output 930 ml  Net -29.17 ml   Filed Weights   09/15/2021 2255 09/24/2021 2300 10/08/21 0348  Weight: 114.2 kg 114.2 kg 112.5 kg    Examination: General: critically ill appearing man lying in bed in NAD HENT: [place]/AT, eyes anicteric, ETT in place. Lungs: mild rhonchi, minimal ETT secretions, breathing comfortably on MV Cardiovascular: S1S2 , irreg rhythm, frequent PVCs  Abdomen: soft, NT Extremities: R fem Impella, normal distal perfusion. No clubbing. Good muscle mass. Neuro: Rass -2, following commands, comprehension appears inract.  I/O net even  CXR personally reviewed-endotracheal tube in appropriate position, mild pulmonary edema.  Impella in place. cooximetry 78% BUN 28 Creatinine 1.43 WBC 14.8 H/H 13.2/41 COVID, flu negative LDH 1177 LA 1.2  Resolved Hospital Problem list     Assessment & Plan:   Acute respiratory failure with hypoxia prior mechanical ventilation Acute pulmonary edema -LTVV, 4 to 8 cc/kg ideal body weight with goal plateau less than 30 and driving pressure less than 15 - VAP prevention protocol- doing well on versed and fentanyl - PAD protocol for sedation - SAT and SBT as appropriate  Cardiogenic Shock, biventricular heart failure Acute HFrEF CAD w/ NSTEMI s/p DES to distal LMCA through mid LAD  Frequent PVCs -Impella per AHF - currently on P8 -digoxin; monitoring per  pharmacy -con't milrinone; off NE currently -DAPT, statin -serial cooximetry  HTN HLD -holding home antihypertensives while in shock -con't atorvastatin  Dm2 with mild hyperglycemia -Sliding scale insulin PRN - Goal BG 140-180 -Continue holding PTA metformin and glipizide  AKI -Strict I's/O - Renally dose meds and avoid nephrotoxic meds - Continue to monitor  Hypomagnesemia -replete -con't to  monitor  Wife updated at bedside.  Best Practice (right click and "Reselect all SmartList Selections" daily)   GI prophylaxis: PPI DVT prophylaxis: heparin (impella purge), lovenox prophylaxis Foley: yes CVC: yes Aline: yes  Labs   CBC: Recent Labs  Lab 10/10/2021 0659 09/16/2021 1933 10/08/21 0409  WBC 12.3*  --  14.8*  HGB 14.4 14.6 13.2  HCT 44.4 43.0 41.0  MCV 82.5  --  83.8  PLT 160  --  172     Basic Metabolic Panel: Recent Labs  Lab 09/18/2021 0659 10/04/2021 1933 09/14/2021 2111 10/08/21 0409  NA 138 139 137 138  K 4.0 4.9 4.6 4.3  CL 102  --  105 106  CO2 26  --  23 23  GLUCOSE 122*  --  172* 182*  BUN 20  --  24* 28*  CREATININE 1.13  --  1.24 1.43*  CALCIUM 9.1  --  8.2* 8.4*  MG  --   --  1.8  --     GFR: Estimated Creatinine Clearance: 63.2 mL/min (A) (by C-G formula based on SCr of 1.43 mg/dL (H)). Recent Labs  Lab 09/21/2021 0659 09/27/2021 1847 10/08/21 0409  WBC 12.3*  --  14.8*  LATICACIDVEN  --  1.8  --      Liver Function Tests: No results for input(s): AST, ALT, ALKPHOS, BILITOT, PROT, ALBUMIN in the last 168 hours. No results for input(s): LIPASE, AMYLASE in the last 168 hours. No results for input(s): AMMONIA in the last 168 hours.  ABG    Component Value Date/Time   PHART 7.342 (L) 09/10/2021 1933   PCO2ART 49.6 (H) 10/05/2021 1933   PO2ART 119 (H) 09/22/2021 1933   HCO3 26.8 09/27/2021 1933   TCO2 28 10/09/2021 1933   ACIDBASEDEF 4.8 (H) 09/26/2021 1324   O2SAT 78.1 10/08/2021 0409      Coagulation Profile: Recent Labs  Lab 09/23/2021 1055  INR 1.2      This patient is critically ill with multiple organ system failure which requires frequent high complexity decision making, assessment, support, evaluation, and titration of therapies. This was completed through the application of advanced monitoring technologies and extensive interpretation of multiple databases. During this encounter critical care time was devoted to patient  care services described in this note for 45 minutes.  Julian Hy, DO 10/08/21 11:59 AM [facility]

## 2021-10-08 NOTE — Progress Notes (Signed)
Stable during shift.  Remains on vent, sats stable, lightly sedated on versed gtt - weaned from 4 to 1.5 mg/hr and fentanyl gtt    . Calm, MAE, keeping RLE still (knee immobilizer in place) follows simple commands.   R fem Impella, P8, no suction alarms, flows 3.5-3.6. No complications at insertion site. Milrinone gtt @ 0.125 mcg/kg/min, Coox 78.1. Low dose levo gtt, titrating for Impella MAP 70-90.   Heparin gtt in Impella purge. R radial hematoma soft. Systemic heparin gtt held d/t  blood urine (improving), blood in NGT output (resolved) and hematoma.   R fem swan, no complications at insertion site. CO 5.6 at start of shift, now up to 7.7. CVP 7-9.   ST with 1 AVB, 110s. Periods of incr ectopy - PVC and short runs of NSVT. Pads on/defib at bedside. Tmax 37.7.   Turning q 2 for comfort and skin integrity.

## 2021-10-09 ENCOUNTER — Encounter (HOSPITAL_COMMUNITY): Payer: Self-pay | Admitting: Internal Medicine

## 2021-10-09 ENCOUNTER — Inpatient Hospital Stay (HOSPITAL_COMMUNITY): Payer: Medicare Other

## 2021-10-09 DIAGNOSIS — Z95811 Presence of heart assist device: Secondary | ICD-10-CM | POA: Diagnosis not present

## 2021-10-09 DIAGNOSIS — J9601 Acute respiratory failure with hypoxia: Secondary | ICD-10-CM | POA: Diagnosis not present

## 2021-10-09 DIAGNOSIS — R570 Cardiogenic shock: Secondary | ICD-10-CM | POA: Diagnosis not present

## 2021-10-09 DIAGNOSIS — Z9911 Dependence on respirator [ventilator] status: Secondary | ICD-10-CM | POA: Diagnosis not present

## 2021-10-09 DIAGNOSIS — I5021 Acute systolic (congestive) heart failure: Secondary | ICD-10-CM

## 2021-10-09 LAB — GLUCOSE, CAPILLARY
Glucose-Capillary: 145 mg/dL — ABNORMAL HIGH (ref 70–99)
Glucose-Capillary: 183 mg/dL — ABNORMAL HIGH (ref 70–99)
Glucose-Capillary: 180 mg/dL — ABNORMAL HIGH (ref 70–99)
Glucose-Capillary: 168 mg/dL — ABNORMAL HIGH (ref 70–99)
Glucose-Capillary: 179 mg/dL — ABNORMAL HIGH (ref 70–99)
Glucose-Capillary: 166 mg/dL — ABNORMAL HIGH (ref 70–99)
Glucose-Capillary: 177 mg/dL — ABNORMAL HIGH (ref 70–99)

## 2021-10-09 LAB — CBC
HCT: 34.8 % — ABNORMAL LOW (ref 39.0–52.0)
HCT: 33.2 % — ABNORMAL LOW (ref 39.0–52.0)
HCT: 32.6 % — ABNORMAL LOW (ref 39.0–52.0)
Hemoglobin: 11.3 g/dL — ABNORMAL LOW (ref 13.0–17.0)
Hemoglobin: 10.6 g/dL — ABNORMAL LOW (ref 13.0–17.0)
Hemoglobin: 10.4 g/dL — ABNORMAL LOW (ref 13.0–17.0)
MCH: 27.2 pg (ref 26.0–34.0)
MCH: 27 pg (ref 26.0–34.0)
MCH: 26.9 pg (ref 26.0–34.0)
MCHC: 32.5 g/dL (ref 30.0–36.0)
MCHC: 31.9 g/dL (ref 30.0–36.0)
MCHC: 31.9 g/dL (ref 30.0–36.0)
MCV: 83.9 fL (ref 80.0–100.0)
MCV: 84.5 fL (ref 80.0–100.0)
MCV: 84.5 fL (ref 80.0–100.0)
Platelets: UNDETERMINED 10*3/uL (ref 150–400)
Platelets: 94 10*3/uL — ABNORMAL LOW (ref 150–400)
Platelets: 92 10*3/uL — ABNORMAL LOW (ref 150–400)
RBC: 4.15 MIL/uL — ABNORMAL LOW (ref 4.22–5.81)
RBC: 3.93 MIL/uL — ABNORMAL LOW (ref 4.22–5.81)
RBC: 3.86 MIL/uL — ABNORMAL LOW (ref 4.22–5.81)
RDW: 14.7 % (ref 11.5–15.5)
RDW: 14.6 % (ref 11.5–15.5)
RDW: 14.7 % (ref 11.5–15.5)
WBC: 9.7 10*3/uL (ref 4.0–10.5)
WBC: 9.3 10*3/uL (ref 4.0–10.5)
WBC: 8.2 10*3/uL (ref 4.0–10.5)
nRBC: 0 % (ref 0.0–0.2)
nRBC: 0 % (ref 0.0–0.2)
nRBC: 0 % (ref 0.0–0.2)

## 2021-10-09 LAB — ECHOCARDIOGRAM LIMITED
Height: 73 in
Weight: 3992.97 oz

## 2021-10-09 LAB — HEPARIN LEVEL (UNFRACTIONATED)
Heparin Unfractionated: <0.1 IU/mL — ABNORMAL LOW (ref 0.30–0.70)
Heparin Unfractionated: <0.1 IU/mL — ABNORMAL LOW (ref 0.30–0.70)

## 2021-10-09 LAB — BASIC METABOLIC PANEL
Anion gap: 10 (ref 5–15)
BUN: 49 mg/dL — ABNORMAL HIGH (ref 8–23)
CO2: 24 mmol/L (ref 22–32)
Calcium: 8.6 mg/dL — ABNORMAL LOW (ref 8.9–10.3)
Chloride: 103 mmol/L (ref 98–111)
Creatinine, Ser: 2.44 mg/dL — ABNORMAL HIGH (ref 0.61–1.24)
GFR, Estimated: 28 mL/min — ABNORMAL LOW (ref >60)
Glucose, Bld: 192 mg/dL — ABNORMAL HIGH (ref 70–99)
Potassium: 4 mmol/L (ref 3.5–5.1)
Sodium: 137 mmol/L (ref 135–145)

## 2021-10-09 LAB — PROCALCITONIN: Procalcitonin: 0.18 ng/mL

## 2021-10-09 LAB — PHOSPHORUS: Phosphorus: 4.4 mg/dL (ref 2.5–4.6)

## 2021-10-09 LAB — COOXEMETRY PANEL
Carboxyhemoglobin: 1.3 % (ref 0.5–1.5)
Methemoglobin: 1.1 % (ref 0.0–1.5)
O2 Saturation: 75.8 %
Total hemoglobin: 11.3 g/dL — ABNORMAL LOW (ref 12.0–16.0)

## 2021-10-09 LAB — MAGNESIUM: Magnesium: 2.6 mg/dL — ABNORMAL HIGH (ref 1.7–2.4)

## 2021-10-09 LAB — LACTATE DEHYDROGENASE: LDH: 782 U/L — ABNORMAL HIGH (ref 98–192)

## 2021-10-09 MED ORDER — INSULIN GLARGINE-YFGN 100 UNIT/ML ~~LOC~~ SOLN
8.0000 [IU] | Freq: Every day | SUBCUTANEOUS | Status: DC
  Administered 2021-10-09 – 2021-10-13 (×5): 8 [IU] via SUBCUTANEOUS
  Filled 2021-10-09 (×7): qty 0.08

## 2021-10-09 MED ORDER — AMIODARONE HCL 200 MG PO TABS
200.0000 mg | ORAL_TABLET | Freq: Two times a day (BID) | ORAL | Status: DC
  Administered 2021-10-09 – 2021-10-16 (×15): 200 mg
  Filled 2021-10-09 (×15): qty 1

## 2021-10-09 MED ORDER — VANCOMYCIN HCL 2000 MG/400ML IV SOLN
2000.0000 mg | Freq: Once | INTRAVENOUS | Status: AC
  Administered 2021-10-09: 2000 mg via INTRAVENOUS
  Filled 2021-10-09: qty 400

## 2021-10-09 MED ORDER — VANCOMYCIN HCL 1250 MG/250ML IV SOLN
1250.0000 mg | INTRAVENOUS | Status: DC
  Administered 2021-10-10 – 2021-10-11 (×2): 1250 mg via INTRAVENOUS
  Filled 2021-10-09 (×2): qty 250

## 2021-10-09 MED ORDER — PANTOPRAZOLE 2 MG/ML SUSPENSION
40.0000 mg | Freq: Every day | ORAL | Status: DC
  Administered 2021-10-10 – 2021-10-11 (×2): 40 mg
  Filled 2021-10-09 (×2): qty 20

## 2021-10-09 MED ORDER — ACETAMINOPHEN 160 MG/5ML PO SOLN
650.0000 mg | Freq: Four times a day (QID) | ORAL | Status: DC | PRN
  Administered 2021-10-09 – 2021-10-11 (×5): 650 mg
  Filled 2021-10-09 (×5): qty 20.3

## 2021-10-09 MED ORDER — SODIUM CHLORIDE 0.9 % IV SOLN
2.0000 g | Freq: Two times a day (BID) | INTRAVENOUS | Status: DC
  Administered 2021-10-09 – 2021-10-11 (×5): 2 g via INTRAVENOUS
  Filled 2021-10-09 (×5): qty 2

## 2021-10-09 MED ORDER — HEPARIN (PORCINE) 25000 UT/250ML-% IV SOLN
400.0000 [IU]/h | INTRAVENOUS | Status: DC
  Administered 2021-10-09: 200 [IU]/h via INTRAVENOUS
  Filled 2021-10-09: qty 250

## 2021-10-09 NOTE — Progress Notes (Signed)
ANTICOAGULATION CONSULT NOTE - Follow Up Consult  Pharmacy Consult for heparin Indication:  Impella CP  Allergies  Allergen Reactions   Pravachol [Pravastatin Sodium]     Rash   Pravastatin Nausea And Vomiting    Other reaction(s): Unknown   Statins Rash    Pt reports only pravastatin causes rash - takes atorvastatin currently    Patient Measurements: Height: 6' 1" (185.4 cm) Weight: 113.2 kg (249 lb 9 oz) IBW/kg (Calculated) : 79.9 Heparin Dosing Weight: 103.4 kg   Vital Signs: Temp: 100 F (37.8 C) (10/30 1000) Temp Source: Core (10/30 0735) BP: 122/64 (10/30 1000) Pulse Rate: 98 (10/30 1000)  Labs: Recent Labs    09/22/2021 0659 09/13/2021 0920 09/18/2021 1055 09/26/2021 1933 10/08/21 0409 10/08/21 1700 10/09/21 0402 10/09/21 0823  HGB 14.4  --   --    < > 13.2  --  11.3* 10.6*  HCT 44.4  --   --    < > 41.0  --  34.8* 33.2*  PLT 160  --   --   --  172  --  PLATELET CLUMPS NOTED ON SMEAR, UNABLE TO ESTIMATE 94*  APTT  --   --  31  --   --   --   --   --   LABPROT  --   --  15.1  --   --   --   --   --   INR  --   --  1.2  --   --   --   --   --   HEPARINUNFRC  --   --   --   --  <0.10*  --  <0.10*  --   CREATININE 1.13  --   --    < > 1.43* 2.12* 2.44*  --   TROPONINIHS 1,296* 7,889*  --   --   --   --   --   --    < > = values in this interval not displayed.     Estimated Creatinine Clearance: 37.1 mL/min (A) (by C-G formula based on SCr of 2.44 mg/dL (H)).   Medical History: Past Medical History:  Diagnosis Date   Arteriosclerosis of coronary artery 10/29/2015   Overview:  with mi    Arthritis    BPH (benign prostatic hyperplasia)    Diabetes mellitus without complication (HCC)    Gout    Heart attack ([place]) 1996   Hypertension    Prostate cancer ([place])    Stroke ([place])     Medications:  Scheduled:   amiodarone  200 mg Per Tube BID   aspirin  81 mg Per Tube Daily   atorvastatin  80 mg Per Tube Daily   chlorhexidine gluconate (MEDLINE KIT)  15 mL  Mouth Rinse BID   Chlorhexidine Gluconate Cloth  6 each Topical Daily   docusate  100 mg Per Tube BID   feeding supplement (PROSource TF)  45 mL Per Tube TID   fentaNYL (SUBLIMAZE) injection  25 mcg Intravenous Once   insulin aspart  0-15 Units Subcutaneous Q4H   mouth rinse  15 mL Mouth Rinse 10 times per day   mupirocin ointment  1 application Nasal BID   [START ON 10/10/2021] pantoprazole sodium  40 mg Per Tube Daily   polyethylene glycol  17 g Per Tube Daily   sodium chloride flush  10-40 mL Intracatheter Q12H   sodium chloride flush  3 mL Intravenous Q12H   ticagrelor  90 mg Per Tube  BID    Assessment: 70 yom presenting to [facility] - found to have NSTEMI now s/p impella supported PCI with DES to distal LM through mid LAD. Had respiratory failure, requiring intubation during cath, also found to be in cardiogenic shock - transferred [facility] on impella. No AC PTA.   Impella CP placed in cath lab - P7,>turned down P4 purge heparin 50uts/ml with purge flow rate 10.5 mL/hr = heparin 525 uts/hr  purge pressure 400s.  Small hematoma where TR band was - but improved today and CBC stable  slightly bloody urine on admit clearing Hgb stable 10-12 plt 170s>90 device related,  LDH 1170 post MI> 700 trending down heparin level < 0.1 less than goal on purge heparin only Discussed with MD will begin  systemic heparin today   Goal of Therapy:  Heparin level 0.2-0.5 units/ml  Monitor platelets by anticoagulation protocol: Yes   Plan:  Continue heparin purge solution (50 units/mL concentration) Begin systemic heparin drip 200 uts/hr - titrate slowly  Q12h Heparin level , CBC, LDH and for s/sx of bleeding Monitor Impella weaning       Pharm.D. CPP, BCPS Clinical Pharmacist [phone] 10/09/2021 11:36 AM   Please check AMION for all [facility] Pharmacy phone numbers After 10:00 PM, call Main Pharmacy [phone]    

## 2021-10-09 NOTE — Progress Notes (Signed)
  Echocardiogram 2D Echocardiogram has been performed.  Merrie Roof F 10/09/2021, 8:33 AM

## 2021-10-09 NOTE — Progress Notes (Signed)
ANTICOAGULATION CONSULT NOTE - Follow Up Consult  Pharmacy Consult for heparin Indication:  Impella CP  Allergies  Allergen Reactions   Pravachol [Pravastatin Sodium]     Rash   Pravastatin Nausea And Vomiting    Other reaction(s): Unknown   Statins Rash    Pt reports only pravastatin causes rash - takes atorvastatin currently    Patient Measurements: Height: 6' 1" (185.4 cm) Weight: 113.2 kg (249 lb 9 oz) IBW/kg (Calculated) : 79.9 Heparin Dosing Weight: 103.4 kg   Vital Signs: Temp: 98.4 F (36.9 C) (10/30 1700) Temp Source: Core (10/30 0735) BP: 115/63 (10/30 1700) Pulse Rate: 84 (10/30 1700)  Labs: Recent Labs    09/26/2021 0659 09/30/2021 0920 09/21/2021 1055 10/06/2021 1933 10/08/21 0409 10/08/21 1700 10/09/21 0402 10/09/21 0823 10/09/21 1657  HGB 14.4  --   --    < > 13.2  --  11.3* 10.6* 10.4*  HCT 44.4  --   --    < > 41.0  --  34.8* 33.2* 32.6*  PLT 160  --   --   --  172  --  PLATELET CLUMPS NOTED ON SMEAR, UNABLE TO ESTIMATE 94* 92*  APTT  --   --  31  --   --   --   --   --   --   LABPROT  --   --  15.1  --   --   --   --   --   --   INR  --   --  1.2  --   --   --   --   --   --   HEPARINUNFRC  --   --   --   --  <0.10*  --  <0.10*  --  <0.10*  CREATININE 1.13  --   --    < > 1.43* 2.12* 2.44*  --   --   TROPONINIHS 1,296* 7,889*  --   --   --   --   --   --   --    < > = values in this interval not displayed.    Estimated Creatinine Clearance: 37.1 mL/min (A) (by C-G formula based on SCr of 2.44 mg/dL (H)).   Medical History: Past Medical History:  Diagnosis Date   Arteriosclerosis of coronary artery 10/29/2015   Overview:  with mi    Arthritis    BPH (benign prostatic hyperplasia)    Diabetes mellitus without complication (HCC)    Gout    Heart attack (HCC) 1996   Hypertension    Prostate cancer (HCC)    Stroke (HCC)     Medications:  Scheduled:   amiodarone  200 mg Per Tube BID   aspirin  81 mg Per Tube Daily   atorvastatin  80 mg Per  Tube Daily   chlorhexidine gluconate (MEDLINE KIT)  15 mL Mouth Rinse BID   Chlorhexidine Gluconate Cloth  6 each Topical Daily   docusate  100 mg Per Tube BID   feeding supplement (PROSource TF)  45 mL Per Tube TID   fentaNYL (SUBLIMAZE) injection  25 mcg Intravenous Once   insulin aspart  0-15 Units Subcutaneous Q4H   insulin glargine-yfgn  8 Units Subcutaneous QHS   mouth rinse  15 mL Mouth Rinse 10 times per day   mupirocin ointment  1 application Nasal BID   [START ON 10/10/2021] pantoprazole sodium  40 mg Per Tube Daily   polyethylene glycol  17 g Per Tube   Daily   sodium chloride flush  10-40 mL Intracatheter Q12H   sodium chloride flush  3 mL Intravenous Q12H   ticagrelor  90 mg Per Tube BID    Assessment: 70 yom presenting to [facility] - found to have NSTEMI now s/p impella supported PCI with DES to distal LM through mid LAD. Had respiratory failure, requiring intubation during cath, also found to be in cardiogenic shock - transferred [facility] on impella. No AC PTA.   Impella purge still at heparin 50 units/ml with purge flow rate 10.5 mL/hr = heparin 525 uts/hr  purge pressure 400s. Heparin level < 0.1 is subtherapeutic on systemic heparin 200 units/hr.   Goal of Therapy:  Heparin level 0.2-0.5 units/ml  Monitor platelets by anticoagulation protocol: Yes   Plan:  Continue heparin purge solution (50 units/mL concentration) Increase heparin drip to 300 units/hr - titrate slowly  Q12h Heparin level , CBC, LDH and for s/sx of bleeding Monitor Impella weaning      , PharmD, BCPS, BCCP Clinical Pharmacist  Please check AMION for all [facility] Pharmacy phone numbers After 10:00 PM, call Main Pharmacy [phone]  

## 2021-10-09 NOTE — Progress Notes (Signed)
Pharmacy Antibiotic Note  Warren Harty Omura Sr. is a 70 y.o. male admitted on 10/06/2021 post ACS with impella support and intubated/sedated. WNC wnl, but increase temp 100.2, PCT slightly elevated 0.18, Cxr possible pna, thick green/tan sputum, drop in SVR. Will draw blood Cx and sputum cx and treat with empiric ABX.  Pharmacy has been consulted for vancomycin and cefepime  dosing. AKI Cr 1.1>2.4   Plan: Vancomycin 2gm x1 then 1250mg  q24h  Est AUC 500 Cefepime 2gm IV q12h  Height: 6\' 1"  (185.4 cm) Weight: 113.2 kg (249 lb 9 oz) IBW/kg (Calculated) : 79.9  Temp (24hrs), Avg:99.8 F (37.7 C), Min:99.3 F (37.4 C), Max:100.2 F (37.9 C)  Recent Labs  Lab 09/12/2021 0659 09/25/2021 1847 09/21/2021 2111 10/08/21 0409 10/08/21 0840 10/08/21 1700 10/09/21 0402 10/09/21 0823  WBC 12.3*  --   --  14.8*  --   --  9.7 9.3  CREATININE 1.13  --  1.24 1.43*  --  2.12* 2.44*  --   LATICACIDVEN  --  1.8  --   --  1.2  --   --   --     Estimated Creatinine Clearance: 37.1 mL/min (A) (by C-G formula based on SCr of 2.44 mg/dL (H)).    Allergies  Allergen Reactions   Pravachol [Pravastatin Sodium]     Rash   Pravastatin Nausea And Vomiting    Other reaction(s): Unknown   Statins Rash    Pt reports only pravastatin causes rash - takes atorvastatin currently    Antimicrobials this admission: Vanc 10/30> Cefepime 10/30>  Dose adjustments this admission:   Microbiology results: 10/30 BCx: ip 10/30 Sputum:  ip 10/28  MRSA PCR: negative  Bonnita Nasuti Pharm.D. CPP, BCPS Clinical Pharmacist [phone] 10/09/2021 11:46 AM

## 2021-10-09 NOTE — Progress Notes (Signed)
Patient ID: Warren Callender Linnemann Sr., male   DOB: 1951/08/08, 70 y.o.   MRN: [id number]     Advanced Heart Failure Rounding Note  PCP-Cardiologist: None   Subjective:    Sedated on vent this morning but will awaken, follow commands.    Decreased ectopy on amiodarone gtt, no NSVT overnight.   MAP stable off NE, on milrinone 0.125, Impella P6.  Creatinine up to 2.44 today.  Impella was short yesterday (inflow port near aortic valve), advanced yesterday.  Echo images obtained today (able to get better images).  Impella still short, advanced and left at 3.9 cm. Position looks stable.  LDH trending down today after yesterday's adjustment and urine is now clear.   Impella: P6 Flow 3.2 L/min Heparin gtt LDH 1177 => 782 Plts clumped   Swan numbers: RA 4 PA 23/15 CI 4.1 Co-ox 76%  CXR: Bibasilar opacities.  Tm 100.2, thick secretions.  WBCs 9.7.    Objective:   Weight Range: 113.2 kg Body mass index is 32.93 kg/m.   Vital Signs:   Temp:  [99.3 F (37.4 C)-100.2 F (37.9 C)] 100.2 F (37.9 C) (10/30 0800) Pulse Rate:  [97-118] 106 (10/30 0800) Resp:  [12-22] 22 (10/30 0800) BP: (83-130)/(48-75) 121/66 (10/30 0800) SpO2:  [97 %-100 %] 99 % (10/30 0800) FiO2 (%):  [40 %-50 %] 40 % (10/30 0356) Weight:  [113.2 kg] 113.2 kg (10/30 0445) Last BM Date: 10/06/21  Weight change: Filed Weights   10/03/2021 2300 10/08/21 0348 10/09/21 0445  Weight: 114.2 kg 112.5 kg 113.2 kg    Intake/Output:   Intake/Output Summary (Last 24 hours) at 10/09/2021 0816 Last data filed at 10/09/2021 0700 Gross per 24 hour  Intake 2044.61 ml  Output 590 ml  Net 1454.61 ml      Physical Exam    General: NAD Neck: No JVD, no thyromegaly or thyroid nodule.  Lungs: Clear to auscultation bilaterally with normal respiratory effort. CV: Nondisplaced PMI.  Heart regular S1/S2, no S3/S4, no murmur.  No peripheral edema.   Abdomen: Soft, nontender, no hepatosplenomegaly, no distention.  Skin: Intact  without lesions or rashes.  Neurologic: Sedated on vent.  Follows commands per nurse.  Extremities: No clubbing or cyanosis.  HEENT: Normal.    Telemetry   Sinus tachy 100s, no further NSVT noted (personally reviewed).   Labs    CBC Recent Labs    10/08/21 0409 10/09/21 0402  WBC 14.8* 9.7  HGB 13.2 11.3*  HCT 41.0 34.8*  MCV 83.8 83.9  PLT 172 PLATELET CLUMPS NOTED ON SMEAR, UNABLE TO ESTIMATE   Basic Metabolic Panel Recent Labs    10/04/2021 2111 10/08/21 0409 10/08/21 0840 10/08/21 1017 10/08/21 1700 10/09/21 0402  NA 137   < >  --   --  138 137  K 4.6   < >  --   --  4.2 4.0  CL 105   < >  --   --  103 103  CO2 23   < >  --   --  25 24  GLUCOSE 172*   < >  --   --  194* 192*  BUN 24*   < >  --   --  36* 49*  CREATININE 1.24   < >  --   --  2.12* 2.44*  CALCIUM 8.2*   < >  --   --  8.5* 8.6*  MG 1.8  --  1.6*  --   --   --  PHOS  --   --   --    < > 4.3 4.4   < > = values in this interval not displayed.   Liver Function Tests No results for input(s): AST, ALT, ALKPHOS, BILITOT, PROT, ALBUMIN in the last 72 hours. No results for input(s): LIPASE, AMYLASE in the last 72 hours. Cardiac Enzymes No results for input(s): CKTOTAL, CKMB, CKMBINDEX, TROPONINI in the last 72 hours.  BNP: BNP (last 3 results) Recent Labs    09/23/2021 0659  BNP 344.3*    ProBNP (last 3 results) No results for input(s): PROBNP in the last 8760 hours.   D-Dimer No results for input(s): DDIMER in the last 72 hours. Hemoglobin A1C Recent Labs    10/01/2021 2025  HGBA1C 6.1*   Fasting Lipid Panel No results for input(s): CHOL, HDL, LDLCALC, TRIG, CHOLHDL, LDLDIRECT in the last 72 hours. Thyroid Function Tests No results for input(s): TSH, T4TOTAL, T3FREE, THYROIDAB in the last 72 hours.  Invalid input(s): FREET3  Other results:   Imaging    ECHOCARDIOGRAM COMPLETE  Result Date: 10/08/2021    ECHOCARDIOGRAM REPORT   Patient Name:   Warren Lippman Huntsberry Sr. Date of  Exam: 10/08/2021 Medical Rec #:  [id number]                Height:       73.0 in Accession #:    [id number]               Weight:       248.0 lb Date of Birth:  [birth year]                 BSA:          2.358 m Patient Age:    35 years                 BP:           108/68 mmHg Patient Gender: M                        HR:           101 bpm. Exam Location:  Inpatient Procedure: 2D Echo, Cardiac Doppler and Color Doppler Indications:    CHF- Acute Systolic Q00.86  History:        Patient has no prior history of Echocardiogram examinations.                 Previous Myocardial Infarction, Stroke; Risk                 Factors:Hypertension, Diabetes and Dyslipidemia. 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  Sonographer:    Alvino Chapel RCS Referring Phys: [phone] Wilmer Floor SIMMONS  Sonographer Comments: Difficult PLAX windows. Had another echo tech come scan also. Images at the end of study. IMPRESSIONS  1. Left ventricular ejection fraction, by estimation, is 20 to 25%. The left ventricle has severely decreased function. The left ventricle demonstrates regional wall motion abnormalities (suggestive of large LAD infarct with associated akinesis), wall motion is largel from the base only. There is mild left ventricular hypertrophy. There is no evidence of LV infarct. The Impella support device is not well visualized in relationship to the aortic  annulus, but inlet is measured at 3.2 mm below the aortic  annulus slightly supeior that ideal  2. Right ventricular systolic function is normal. The right ventricular size is normal. Tricuspid regurgitation signal is inadequate for assessing PA pressure.  3. Left atrial size was mildly dilated.  4. A small pericardial effusion is present.  5. The mitral valve is normal in structure. No evidence of mitral valve regurgitation. No evidence of mitral stenosis.  6. The aortic valve  was not well visualized. Aortic valve regurgitation is mild.  7. The inferior vena cava is dilated in size with >50% respiratory variability, suggesting right atrial pressure of 8 mmHg. Conclusion(s)/Recommendation(s): No increase in pressors requirements and interval decrease to from P8 to P7, will not advance device. Will notify primary AHF MD. FINDINGS  Left Ventricle: Left ventricular ejection fraction, by estimation, is 20 to 25%. The left ventricle has severely decreased function. The left ventricle demonstrates regional wall motion abnormalities. The left ventricular internal cavity size was normal  in size. There is mild left ventricular hypertrophy. Left ventricular diastolic parameters are indeterminate. Right Ventricle: The right ventricular size is normal. No increase in right ventricular wall thickness. Right ventricular systolic function is normal. Tricuspid regurgitation signal is inadequate for assessing PA pressure. Left Atrium: Left atrial size was mildly dilated. Right Atrium: Right atrial size was normal in size. Pericardium: A small pericardial effusion is present. Mitral Valve: The mitral valve is normal in structure. No evidence of mitral valve regurgitation. No evidence of mitral valve stenosis. Tricuspid Valve: The tricuspid valve is normal in structure. Tricuspid valve regurgitation is not demonstrated. Aortic Valve: The aortic valve was not well visualized. Aortic valve regurgitation is mild. Pulmonic Valve: The pulmonic valve was not well visualized. Pulmonic valve regurgitation is not visualized. No evidence of pulmonic stenosis. Aorta: The aortic root is normal in size and structure. Venous: The inferior vena cava is dilated in size with greater than 50% respiratory variability, suggesting right atrial pressure of 8 mmHg. IAS/Shunts: The atrial septum is grossly normal. Additional Comments: A venous catheter is visualized.  LEFT VENTRICLE PLAX 2D LVIDd:         5.00 cm LVIDs:          3.00 cm LV PW:         1.10 cm LV IVS:        1.10 cm LVOT diam:     2.40 cm LV SV:         57 LV SV Index:   24 LVOT Area:     4.52 cm  LV Volumes (MOD) LV vol d, MOD A2C: 124.0 ml LV vol d, MOD A4C: 143.0 ml LV vol s, MOD A2C: 84.1 ml LV vol s, MOD A4C: 69.9 ml LV SV MOD A2C:     39.9 ml LV SV MOD A4C:     143.0 ml LV SV MOD BP:      57.5 ml RIGHT VENTRICLE RV S prime:     15.30 cm/s TAPSE (M-mode): 2.4 cm LEFT ATRIUM             Index        RIGHT ATRIUM           Index LA diam:        4.00 cm 1.70 cm/m   RA Area:     14.60 cm LA Vol (A2C):   62.2 ml 26.38 ml/m  RA Volume:   33.80 ml  14.34 ml/m LA Vol (A4C):  84.6 ml 35.88 ml/m LA Biplane Vol: 76.8 ml 32.57 ml/m  AORTIC VALVE LVOT Vmax:   81.60 cm/s LVOT Vmean:  58.900 cm/s LVOT VTI:    0.125 m  AORTA Ao Root diam: 3.05 cm MITRAL VALVE MV Area (PHT): 4.49 cm    SHUNTS MV Decel Time: 169 msec    Systemic VTI:  0.12 m MV E velocity: 79.05 cm/s  Systemic Diam: 2.40 cm Rudean Haskell MD Electronically signed by Rudean Haskell MD Signature Date/Time: 10/08/2021/3:13:40 PM    Final      Medications:     Scheduled Medications:  aspirin  81 mg Per Tube Daily   atorvastatin  80 mg Per Tube Daily   chlorhexidine gluconate (MEDLINE KIT)  15 mL Mouth Rinse BID   Chlorhexidine Gluconate Cloth  6 each Topical Daily   docusate  100 mg Per Tube BID   feeding supplement (PROSource TF)  45 mL Per Tube TID   fentaNYL (SUBLIMAZE) injection  25 mcg Intravenous Once   insulin aspart  0-15 Units Subcutaneous Q4H   mouth rinse  15 mL Mouth Rinse 10 times per day   mupirocin ointment  1 application Nasal BID   pantoprazole (PROTONIX) IV  40 mg Intravenous Q24H   polyethylene glycol  17 g Per Tube Daily   sodium chloride flush  10-40 mL Intracatheter Q12H   sodium chloride flush  3 mL Intravenous Q12H   ticagrelor  90 mg Per Tube BID    Infusions:  sodium chloride 10 mL/hr at 10/09/21 0700   amiodarone 30 mg/hr (10/09/21 0700)   feeding  supplement (VITAL HIGH PROTEIN) 50 mL/hr at 10/09/21 0400   fentaNYL infusion INTRAVENOUS 100 mcg/hr (10/09/21 0700)   heparin 50 units/mL (Impella PURGE) in dextrose 5 % 1000 mL bag     midazolam 2 mg/hr (10/09/21 0700)   milrinone 0.125 mcg/kg/min (10/09/21 0700)   norepinephrine (LEVOPHED) Adult infusion Stopped (10/08/21 2349)    PRN Medications: sodium chloride, fentaNYL, midazolam, sodium chloride flush, sodium chloride flush   Assessment/Plan   1. CAD: Prior inferior MI and placement of 2 stents to RCA in 1996.  Admitted with NSTEMI, found to have 75% distal LM stenosis with occluded proximal LAD and ostial LCx.  S/p PCI distal LM through mid LAD, ostial LCx remains occluded.  - ASA + Brilinta.  - Atorvastatin.  2. Acute systolic CHF/cardiogenic shock: Ischemic cardiomyopathy.  Bedside echo with EF 20% range.  Impella placed in cath lab with cardiogenic shock.  Currently with Impella CP at P6, no alarms.  Impella short on echo yesterday (inflow near aortic valve), was withdrawn.  Assessed again by echo this morning, still relatively short and advanced to stable position at 3.9 cm. Urine now clearing with LDH trending down.  CI 4.1, co-ox 76%.  CVP 4.  He remains on milrinone 0.125. MAP stable.  - Decrease Impella to P4 today, hopefully can wean to off tomorrow. Repeat CBC with clumped plts on initial CBC today.  Continue heparin gtt for Impella.  - Continue milrinone 0.125.  - With creatinine rising, hold off on digoxin.  - Does not need diuretic.  3. NSVT/PVCs: Frequent initially now has settled down on amiodarone gtt.    - Can transition amiodarone to po.  4. Acute hypoxemic respiratory failure: CXR today with bibasilar infiltrates.  Also with low grade fever to 100.2 and thick secretions.  - Send PCT, blood and sputum cultures.  - Favor empiric Abx => vancomycin/cefepime.  5. Type 2  diabetes: SSI.  6. AKI: In setting of cardiogenic shock.  Urine dark yesterday and now clear,  suspect hemolysis from Impella position may have played a role.  With adjustment in Impella and improved cardiac output, hopefully this will improve.   7. Anemia: Hgb 13 => 11, suspect hemolysis with Impella plays a role. Position adjusted.    CRITICAL CARE Performed by: Loralie Champagne  Total critical care time: 40 minutes  Critical care time was exclusive of separately billable procedures and treating other patients.  Critical care was necessary to treat or prevent imminent or life-threatening deterioration.  Critical care was time spent personally by me on the following activities: development of treatment plan with patient and/or surrogate as well as nursing, discussions with consultants, evaluation of patient's response to treatment, examination of patient, obtaining history from patient or surrogate, ordering and performing treatments and interventions, ordering and review of laboratory studies, ordering and review of radiographic studies, pulse oximetry and re-evaluation of patient's condition.   Length of Stay: 2  Loralie Champagne, MD  10/09/2021, 8:16 AM  Advanced Heart Failure Team Pager [phone] (M-F; 7a - 5p)  Please contact [facility] for night-coverage after hours (5p -7a ) and weekends on [email or web address]

## 2021-10-09 NOTE — Progress Notes (Signed)
NAME:  Warren Bair Sr., MRN:  [id number], DOB:  1951/04/18, LOS: 2 ADMISSION DATE:  10/10/2021, CONSULTATION DATE:  10/08/2021 REFERRING MD:  Bensimhon - Adv HF, CHIEF COMPLAINT:  Respiratory failure    History of Present Illness:  70 yo M PMH CAD s/p 2x stents RCA (1996), CVA, HTN, HLD, DM2, prostate cancer who presented to ED 10/28 with R sided chest pain, reportedly beginning 0400 10/28. Later developed SOB, improved with nitro (took nitro at home and again with EMS). In ED chest pain recurred, with associated SOB and diaphoresis, pain radiating down L arm. Found to have NSTEMI, develope respiratory distress and was intubated.  At [facility] he was taken to cath lab and found to have 75% distal LM occlusion, pLAD and Lcx occlusion. He underwent impella supported PCI / DES distal LM through mid LAD. LVEF 25%. Decision made to transfer to [name] for admission.   PCCM is consulted for vent management in setting of respiratory failure requiring intubation   Pertinent  Medical History  CAD HLD HTN DM2 CVA Significant Hospital Events: Including procedures, antibiotic start and stop dates in addition to other pertinent events   10/28 [facility] for chest pain. Taken to cath lab, ruled in for NSTEMI, intubated for resp distress, underwent impella supported PCI/DES with LM to LAD intervention. Transferred to [facility] for cardiogenic shock. PCCM consulted for vent   Interim History / Subjective:  He denies complaints today. Impella down to P4. On milrinone, remains off NE. SVR lower today.  Objective   Blood pressure 122/64, pulse 98, temperature 100 F (37.8 C), resp. rate 13, height 6\' 1"  (1.854 m), weight 113.2 kg, SpO2 98 %. PAP: (23-38)/(14-31) 31/16 CVP:  [3 mmHg-17 mmHg] 7 mmHg CO:  [8 L/min-10.3 L/min] 9.5 L/min CI:  [3.4 L/min/m2-4.4 L/min/m2] 4.1 L/min/m2  Vent Mode: PRVC FiO2 (%):  [40 %-50 %] 50 % Set Rate:  [14 bmp-16 bmp] 16 bmp Vt Set:  [500 mL-630 mL] 630 mL PEEP:  [8 cmH20] 8  cmH20 Plateau Pressure:  [12 cmH20-19 cmH20] 12 cmH20   Intake/Output Summary (Last 24 hours) at 10/09/2021 1126 Last data filed at 10/09/2021 1000 Gross per 24 hour  Intake 2170.61 ml  Output 615 ml  Net 1555.61 ml    Filed Weights   10/02/2021 2300 10/08/21 0348 10/09/21 0445  Weight: 114.2 kg 112.5 kg 113.2 kg    Examination: General: critically ill appearing man lying in bed in NAD HENT: [place]/AT, eyes anicteric, ETT in place Lungs: rhonchi cleared with suctioning, thicker secretions today, synchronous with MV Cardiovascular: S1S2, RRR, less PVCs Abdomen: obese, soft, NT Extremities: R femoral impella without erythema, normal distal perfusion. Pedal and ankle edema. Appropriate muscle mass. Neuro: RASS -3, nods to answer questions and follows commands.  I/O +1.4L Net +1.4 for admission  CXR personally reviewed- bilateral effusions cooximetry 76% BUN 49 Creatinine 2.44 LDH 782 Procalcitonin 0.18 WBC 9.3 H/H 10.6/33.2 Platelets 94  Resolved Hospital Problem list     Assessment & Plan:   Acute respiratory failure with hypoxia prior mechanical ventilation Acute pulmonary edema Likely bilateral pleural effusions -con't LTVV, 4-8cc/kg IBW with goal Pplat<30 and DP<15-meeting goals -VAP prevention protocol -PAD protocol for sedation -SAT & SBT as appropriate; secretions should be improved before attempting extubation, especially with femoral impella in place  Septic shock superimposed to cardiogenic shock -cultures today -empiric antibiotics- vanc, cefepime  Cardiogenic shock, biventricular heart failure Acute HFrEF CAD w/ NSTEMI s/p DES to distal LMCA through mid LAD  Frequent PVCs -Impella per AHF - currently on P4, still weaning -hold digoxin -IV switched to enteral amiodarone -con't milrinone; remains off NE  -aspirin, Brilinta, statin -daily cooximetry   HTN HLD -holding home antihypertensives while in shock -con't atorvastatin daily  Dm2 with mild  hyperglycemia -Sliding scale insulin PRN -start lantus 8 units QHS - Goal BG 140-180 -Continue holding PTA metformin and glipizide  AKI, worsening -strict I/O -renally dose meds, avoid pnephrotoxic meds - Continue to monitor  Hypomagnesemia, resolved -con't to monitor    Best Practice (right click and "Reselect all SmartList Selections" daily)   GI prophylaxis: PPI DVT prophylaxis: heparin (impella purge)  Foley: yes CVC: yes Aline: yes  Labs   CBC: Recent Labs  Lab 09/24/2021 0659 10/04/2021 1933 10/08/21 0409 10/09/21 0402 10/09/21 0823  WBC 12.3*  --  14.8* 9.7 9.3  HGB 14.4 14.6 13.2 11.3* 10.6*  HCT 44.4 43.0 41.0 34.8* 33.2*  MCV 82.5  --  83.8 83.9 84.5  PLT 160  --  172 PLATELET CLUMPS NOTED ON SMEAR, UNABLE TO ESTIMATE 94*     Basic Metabolic Panel: Recent Labs  Lab 10/04/2021 0659 09/28/2021 1933 10/10/2021 2111 10/08/21 0409 10/08/21 0840 10/08/21 1017 10/08/21 1700 10/09/21 0402  NA 138 139 137 138  --   --  138 137  K 4.0 4.9 4.6 4.3  --   --  4.2 4.0  CL 102  --  105 106  --   --  103 103  CO2 26  --  23 23  --   --  25 24  GLUCOSE 122*  --  172* 182*  --   --  194* 192*  BUN 20  --  24* 28*  --   --  36* 49*  CREATININE 1.13  --  1.24 1.43*  --   --  2.12* 2.44*  CALCIUM 9.1  --  8.2* 8.4*  --   --  8.5* 8.6*  MG  --   --  1.8  --  1.6*  --   --   --   PHOS  --   --   --   --   --  4.2 4.3 4.4    GFR: Estimated Creatinine Clearance: 37.1 mL/min (A) (by C-G formula based on SCr of 2.44 mg/dL (H)). Recent Labs  Lab 09/29/2021 0659 10/10/2021 1847 10/08/21 0409 10/08/21 0840 10/09/21 0402 10/09/21 0823  PROCALCITON  --   --   --   --   --  0.18  WBC 12.3*  --  14.8*  --  9.7 9.3  LATICACIDVEN  --  1.8  --  1.2  --   --      Liver Function Tests: No results for input(s): AST, ALT, ALKPHOS, BILITOT, PROT, ALBUMIN in the last 168 hours. No results for input(s): LIPASE, AMYLASE in the last 168 hours. No results for input(s): AMMONIA in the  last 168 hours.  ABG    Component Value Date/Time   PHART 7.342 (L) 10/03/2021 1933   PCO2ART 49.6 (H) 10/06/2021 1933   PO2ART 119 (H) 09/16/2021 1933   HCO3 26.8 10/04/2021 1933   TCO2 28 09/23/2021 1933   ACIDBASEDEF 4.8 (H) 09/29/2021 1324   O2SAT 75.8 10/09/2021 0402      Coagulation Profile: Recent Labs  Lab 09/24/2021 1055  INR 1.2      This patient is critically ill with multiple organ system failure which requires frequent high complexity decision making, assessment, support, evaluation, and  titration of therapies. This was completed through the application of advanced monitoring technologies and extensive interpretation of multiple databases. During this encounter critical care time was devoted to patient care services described in this note for 44 minutes.  Julian Hy, DO 10/09/21 4:15 PM [facility]

## 2021-10-09 NOTE — Plan of Care (Signed)
Stable during shift.   Remains on vent, sats stable, thick secretions from ETT. Lightly sedated on versed gtt, pain controlled with fentanyl gtt. Follows commands, MAE.   Remains on Impella, P6, flows 2.8-2.9. No suction alarms. Milrinone gtt continues. CO continues to trend up 8 --> 9.6--> 10.2. Briefly required low dose levo gtt (< 2hr) for BP goal SBP > 90 and MAP > 65.   ST/ST, amio gtt, minimal ectopy/PVCs, no NSVT.   Tolerating tube feeds.   Foley, decr urine output but trending back up. 590 ml/24 hr. Urine clearing up. BUN/Cr continue to incr 49/2.44.   Tmax 37.9. Responded to fan/cool cloth on forehead and ice packs. WBC 9.7.   No  bleeding noted.    Problem: Education: Goal: Ability to demonstrate management of disease process will improve Outcome: Progressing   Problem: Cardiac: Goal: Ability to achieve and maintain adequate cardiopulmonary perfusion will improve Outcome: Progressing   Problem: Clinical Measurements: Goal: Will remain free from infection Outcome: Progressing   Problem: Nutrition: Goal: Adequate nutrition will be maintained Outcome: Progressing   Problem: Coping: Goal: Level of anxiety will decrease Outcome: Progressing   Restraints not started/required, mitts in use with good success. Problem: Safety: Goal: Non-violent Restraint(s) Outcome: Completed/Met

## 2021-10-10 ENCOUNTER — Inpatient Hospital Stay (HOSPITAL_COMMUNITY): Payer: Medicare Other

## 2021-10-10 ENCOUNTER — Encounter: Payer: Self-pay | Admitting: Internal Medicine

## 2021-10-10 DIAGNOSIS — R570 Cardiogenic shock: Secondary | ICD-10-CM | POA: Diagnosis not present

## 2021-10-10 DIAGNOSIS — J9601 Acute respiratory failure with hypoxia: Secondary | ICD-10-CM | POA: Diagnosis not present

## 2021-10-10 DIAGNOSIS — I5021 Acute systolic (congestive) heart failure: Secondary | ICD-10-CM | POA: Diagnosis not present

## 2021-10-10 LAB — COOXEMETRY PANEL
Carboxyhemoglobin: 1 % (ref 0.5–1.5)
Carboxyhemoglobin: 1.2 % (ref 0.5–1.5)
Methemoglobin: 1.1 % (ref 0.0–1.5)
Methemoglobin: 0.9 % (ref 0.0–1.5)
O2 Saturation: 67.3 %
O2 Saturation: 71.2 %
Total hemoglobin: 12.9 g/dL (ref 12.0–16.0)
Total hemoglobin: 10.1 g/dL — ABNORMAL LOW (ref 12.0–16.0)

## 2021-10-10 LAB — CBC
HCT: 33.6 % — ABNORMAL LOW (ref 39.0–52.0)
Hemoglobin: 10.7 g/dL — ABNORMAL LOW (ref 13.0–17.0)
MCH: 27.1 pg (ref 26.0–34.0)
MCHC: 31.8 g/dL (ref 30.0–36.0)
MCV: 85.1 fL (ref 80.0–100.0)
Platelets: 88 10*3/uL — ABNORMAL LOW (ref 150–400)
RBC: 3.95 MIL/uL — ABNORMAL LOW (ref 4.22–5.81)
RDW: 14.7 % (ref 11.5–15.5)
WBC: 9 10*3/uL (ref 4.0–10.5)
nRBC: 0 % (ref 0.0–0.2)

## 2021-10-10 LAB — LACTATE DEHYDROGENASE: LDH: 632 U/L — ABNORMAL HIGH (ref 98–192)

## 2021-10-10 LAB — GLUCOSE, CAPILLARY
Glucose-Capillary: 152 mg/dL — ABNORMAL HIGH (ref 70–99)
Glucose-Capillary: 155 mg/dL — ABNORMAL HIGH (ref 70–99)
Glucose-Capillary: 182 mg/dL — ABNORMAL HIGH (ref 70–99)
Glucose-Capillary: 193 mg/dL — ABNORMAL HIGH (ref 70–99)
Glucose-Capillary: 217 mg/dL — ABNORMAL HIGH (ref 70–99)
Glucose-Capillary: 226 mg/dL — ABNORMAL HIGH (ref 70–99)

## 2021-10-10 LAB — HEPARIN LEVEL (UNFRACTIONATED): Heparin Unfractionated: <0.1 IU/mL — ABNORMAL LOW (ref 0.30–0.70)

## 2021-10-10 LAB — BASIC METABOLIC PANEL
Anion gap: 9 (ref 5–15)
BUN: 61 mg/dL — ABNORMAL HIGH (ref 8–23)
CO2: 24 mmol/L (ref 22–32)
Calcium: 8.8 mg/dL — ABNORMAL LOW (ref 8.9–10.3)
Chloride: 104 mmol/L (ref 98–111)
Creatinine, Ser: 2.2 mg/dL — ABNORMAL HIGH (ref 0.61–1.24)
GFR, Estimated: 31 mL/min — ABNORMAL LOW (ref >60)
Glucose, Bld: 193 mg/dL — ABNORMAL HIGH (ref 70–99)
Potassium: 4.4 mmol/L (ref 3.5–5.1)
Sodium: 137 mmol/L (ref 135–145)

## 2021-10-10 LAB — PHOSPHORUS: Phosphorus: 3.5 mg/dL (ref 2.5–4.6)

## 2021-10-10 LAB — MAGNESIUM: Magnesium: 2.3 mg/dL (ref 1.7–2.4)

## 2021-10-10 MED ORDER — FUROSEMIDE 10 MG/ML IJ SOLN
80.0000 mg | Freq: Once | INTRAMUSCULAR | Status: AC
  Administered 2021-10-10: 80 mg via INTRAVENOUS
  Filled 2021-10-10: qty 8

## 2021-10-10 MED ORDER — FUROSEMIDE 10 MG/ML IJ SOLN
60.0000 mg | Freq: Once | INTRAMUSCULAR | Status: AC
  Administered 2021-10-10: 60 mg via INTRAVENOUS
  Filled 2021-10-10: qty 6

## 2021-10-10 MED ORDER — ENOXAPARIN SODIUM 40 MG/0.4ML IJ SOSY
40.0000 mg | PREFILLED_SYRINGE | INTRAMUSCULAR | Status: DC
  Administered 2021-10-10 – 2021-10-12 (×3): 40 mg via SUBCUTANEOUS
  Filled 2021-10-10 (×3): qty 0.4

## 2021-10-10 NOTE — Progress Notes (Addendum)
Inpatient Diabetes Program Recommendations  AACE/ADA: New Consensus Statement on Inpatient Glycemic Control (2015)  Target Ranges:  Prepandial:   less than 140 mg/dL      Peak postprandial:   less than 180 mg/dL (1-2 hours)      Critically ill patients:  140 - 180 mg/dL   Lab Results  Component Value Date   GLUCAP 193 (H) 10/10/2021   HGBA1C 6.1 (H) 10/03/2021    Review of Glycemic Control Results for Warren Stone, Warren Stone. "NEIL" (MRN [id number]) as of 10/10/2021 15:12  Ref. Range 10/09/2021 19:52 10/09/2021 23:19 10/10/2021 04:03 10/10/2021 07:44 10/10/2021 11:26  Glucose-Capillary Latest Ref Range: 70 - 99 mg/dL 166 (H) 177 (H) 217 (H) 226 (H) 193 (H)   Diabetes history:  DM 2 Outpatient Diabetes medications:  Glucotrol 10 mg bid Current orders for Inpatient glycemic control:  Novolog moderate q 4 hours Semglee 8 units q HS Vital 60 cc/hr  Inpatient Diabetes Program Recommendations:    Blood sugars slightly>goal.  Consider adding Novolog tube feed coverage 2 units q 4 hours.    Thanks,  Adah Perl, RN, BC-ADM Inpatient Diabetes Coordinator Pager [phone]  (8a-5p)

## 2021-10-10 NOTE — Progress Notes (Signed)
Impella weaned to P-3 with stable co-ox and Swan #s. Heparin stopped for several hours.   Patient felt to be stable for pump removal.   Device turned to P-1 and pulled across the AoV then device turned off and pulled down to the sheath. I then pulled the device and sheath out of the body and held manual pressure for 35 minutes on the RFA site with good hemostasis and stable RLE perfusion. Vitals stable throughout.   We then cleaned the area and placed a new dressing. A fem stop was then applied at 47mmHG. There was excellent hemostasis and a strong dopplerable right DP pulse.   Glori Bickers, MD

## 2021-10-10 NOTE — Plan of Care (Signed)
Stable during shift.  Remains on vent, sats stable, thick/tan secretions. Femoral Impella, P4, flow 2.5-2.6. No suction alarms. CVP 7-10 (slightly dampened waveform and lumen flushes but no blood return, white/yellow lumens with blood return). Milrinone @ 0.125. CO > 9. SVR remains low 600s.  Swan, PAP 30s, episodes of restlessness causing incr PA and BP pressures to incr to 50-60s. Responded to emotional support, fent/versed boluses and incr gtt rates. Mitts for safety.   NSR (low ST when restless), minimal ectopy/PVCs, no NSVT.   Heparin in purge and systemic, Xa < 0.10, adjusted by pharmacy.   Foley, urine output improving, 1.6 L/24 hr. BUN incr to 61 but Cr down to 2.2. Sediment at times, slightly pink tinged early this am, no other bleeding/Heparin complications. Platelets 88. H/H stable.   Afebrile, abx as ordered.   Problem: Cardiac: Goal: Ability to achieve and maintain adequate cardiopulmonary perfusion will improve Outcome: Progressing   Problem: Clinical Measurements: Goal: Will remain free from infection Outcome: Progressing Goal: Diagnostic test results will improve Outcome: Progressing   Problem: Nutrition: Goal: Adequate nutrition will be maintained Outcome: Progressing   Problem: Coping: Goal: Level of anxiety will decrease Outcome: Progressing

## 2021-10-10 NOTE — Progress Notes (Signed)
Heart Failure Navigator Progress Note  Assessed for Heart & Vascular TOC clinic readiness.  Patient does not meet criteria due to AHF team is attending medical team.    Navigator available for reassessment of patient.   Pricilla Holm, MSN, RN Heart Failure Nurse Navigator [phone]

## 2021-10-10 NOTE — Progress Notes (Signed)
NAME:  Warren Salehi Sr., MRN:  [id number], DOB:  [birth year], LOS: 3 ADMISSION DATE:  09/24/2021, CONSULTATION DATE:  09/22/2021 REFERRING MD:  Bensimhon - Adv HF, CHIEF COMPLAINT:  Respiratory failure    History of Present Illness:  70 yo M PMH CAD s/p 2x stents RCA (1996), CVA, HTN, HLD, DM2, prostate cancer who presented to ED 10/28 with R sided chest pain, reportedly beginning 0400 10/28. Later developed SOB, improved with nitro (took nitro at home and again with EMS). In ED chest pain recurred, with associated SOB and diaphoresis, pain radiating down L arm. Found to have NSTEMI, develope respiratory distress and was intubated.  At [facility] he was taken to cath lab and found to have 75% distal LM occlusion, pLAD and Lcx occlusion. He underwent impella supported PCI / DES distal LM through mid LAD. LVEF 25%. Decision made to transfer to [name] for admission.  PCCM is consulted for vent management in setting of respiratory failure requiring intubation   Pertinent  Medical History  CAD HLD HTN DM2 CVA Significant Hospital Events: Including procedures, antibiotic start and stop dates in addition to other pertinent events   10/28 [facility] for chest pain. Taken to cath lab, ruled in for NSTEMI, intubated for resp distress, underwent impella supported PCI/DES with LM to LAD intervention. Transferred to [facility] for cardiogenic shock. PCCM consulted for vent  10/31 tolerating PSV  Interim History / Subjective:   Tolerated SBT this am.  For potential Impella removal today.   Objective   Blood pressure 126/71, pulse 95, temperature 98.6 F (37 C), resp. rate 12, height 6\' 1"  (1.854 m), weight 114.8 kg, SpO2 97 %. PAP: (23-69)/(12-36) 39/27 CVP:  [3 mmHg-15 mmHg] 8 mmHg CO:  [7.3 L/min-10.7 L/min] 10.7 L/min CI:  [3.1 L/min/m2-4.6 L/min/m2] 4.6 L/min/m2  Vent Mode: PSV;CPAP FiO2 (%):  [30 %] 30 % Set Rate:  [15 bmp] 15 bmp Vt Set:  [630 mL] 630 mL PEEP:  [5 cmH20-8 cmH20] 5 cmH20 Pressure  Support:  [8 cmH20] 8 cmH20 Plateau Pressure:  [9 cmH20-17 cmH20] 16 cmH20   Intake/Output Summary (Last 24 hours) at 10/10/2021 1009 Last data filed at 10/10/2021 1000 Gross per 24 hour  Intake 3304.99 ml  Output 1805 ml  Net 1499.99 ml    Filed Weights   10/08/21 0348 10/09/21 0445 10/10/21 0400  Weight: 112.5 kg 113.2 kg 114.8 kg    Examination: General: critically ill appearing man lying in bed in NAD HENT: [place]/AT, eyes anicteric, ETT in place Lungs: Chest clear. Tolerated SBT. Cardiovascular: normal heart sounds.  PCWP 20 Abdomen: obese, soft, NT Extremities: R femoral impella without erythema, normal distal perfusion. Pedal and ankle edema. Appropriate muscle mass. Neuro: will follow commands.   Resolved Hospital Problem list     Assessment & Plan:   Critically ill due to Acute respiratory failure with hypoxia requiring mechanical ventilation Acute pulmonary edema Likely bilateral pleural effusions Critically ill due to Cardiogenic shock, biventricular heart failure requiring milrinone and Impella support.  Acute HFrEF CAD w/ NSTEMI s/p DES to distal LMCA through mid LAD  HTN HLD AKI, improving  Plan:  - Tolerated SBT today and follows commands with sedation interruption, thus likely ready for extubation. -Continue pulmonary toilet and empiric antibiotics for now. -Ideally would benefit from diuresis prior to attempted extubation.  PCWP is elevated and tracks well with PA diastolic. -Continue milrinone support.  Attempt to discontinue Impella today continue to track SCV O2. -Secondary prevention: Continue aspirin and Brilinta,  atorvastatin.  Best Practice (right click and "Reselect all SmartList Selections" daily)  Nutrition: Tube feeds. GI prophylaxis: PPI DVT prophylaxis: heparin (impella purge)  Foley: yes CVC: yes Aline: yes  Labs   CBC: Recent Labs  Lab 10/08/21 0409 10/09/21 0402 10/09/21 0823 10/09/21 1657 10/10/21 0354  WBC 14.8* 9.7 9.3  8.2 9.0  HGB 13.2 11.3* 10.6* 10.4* 10.7*  HCT 41.0 34.8* 33.2* 32.6* 33.6*  MCV 83.8 83.9 84.5 84.5 85.1  PLT 172 PLATELET CLUMPS NOTED ON SMEAR, UNABLE TO ESTIMATE 94* 92* 88*     Basic Metabolic Panel: Recent Labs  Lab 10/08/2021 2111 10/08/21 0409 10/08/21 0840 10/08/21 1017 10/08/21 1700 10/09/21 0402 10/09/21 0823 10/10/21 0354  NA 137 138  --   --  138 137  --  137  K 4.6 4.3  --   --  4.2 4.0  --  4.4  CL 105 106  --   --  103 103  --  104  CO2 23 23  --   --  25 24  --  24  GLUCOSE 172* 182*  --   --  194* 192*  --  193*  BUN 24* 28*  --   --  36* 49*  --  61*  CREATININE 1.24 1.43*  --   --  2.12* 2.44*  --  2.20*  CALCIUM 8.2* 8.4*  --   --  8.5* 8.6*  --  8.8*  MG 1.8  --  1.6*  --   --   --  2.6* 2.3  PHOS  --   --   --  4.2 4.3 4.4  --  3.5    GFR: Estimated Creatinine Clearance: 41.5 mL/min (A) (by C-G formula based on SCr of 2.2 mg/dL (H)). Recent Labs  Lab 10/05/2021 1847 10/08/21 0409 10/08/21 0840 10/09/21 0402 10/09/21 0823 10/09/21 1657 10/10/21 0354  PROCALCITON  --   --   --   --  0.18  --   --   WBC  --    < >  --  9.7 9.3 8.2 9.0  LATICACIDVEN 1.8  --  1.2  --   --   --   --    < > = values in this interval not displayed.     Liver Function Tests: No results for input(s): AST, ALT, ALKPHOS, BILITOT, PROT, ALBUMIN in the last 168 hours. No results for input(s): LIPASE, AMYLASE in the last 168 hours. No results for input(s): AMMONIA in the last 168 hours.  ABG    Component Value Date/Time   PHART 7.342 (L) 10/06/2021 1933   PCO2ART 49.6 (H) 10/05/2021 1933   PO2ART 119 (H) 10/02/2021 1933   HCO3 26.8 09/25/2021 1933   TCO2 28 10/01/2021 1933   ACIDBASEDEF 4.8 (H) 10/04/2021 1324   O2SAT 67.3 10/10/2021 0354      Coagulation Profile: Recent Labs  Lab 09/14/2021 1055  INR 1.2    CRITICAL CARE Performed by: Kipp Brood   Total critical care time: 35 minutes  Critical care time was exclusive of separately billable  procedures and treating other patients.  Critical care was necessary to treat or prevent imminent or life-threatening deterioration.  Critical care was time spent personally by me on the following activities: development of treatment plan with patient and/or surrogate as well as nursing, discussions with consultants, evaluation of patient's response to treatment, examination of patient, obtaining history from patient or surrogate, ordering and performing treatments and interventions, ordering and review of  laboratory studies, ordering and review of radiographic studies, pulse oximetry, re-evaluation of patient's condition and participation in multidisciplinary rounds.  Kipp Brood, MD [facility] ICU Physician [place]  Pager: [phone] Mobile: [phone] After hours: [phone].

## 2021-10-10 NOTE — Progress Notes (Addendum)
Patient ID: Warren Saini Espericueta Sr., male   DOB: 24-May-1951, 70 y.o.   MRN: [id number]     Advanced Heart Failure Rounding Note  PCP-Cardiologist: None   Subjective:    Sedated on vent at time of evaluation. Agitated this am when awakened.  More hematuria this am  On milrinone 0.125, Impella P4.    Creatinine trending down, 2.4 > 2.2, baseline around 1.1.    Impella was short (inflow port near aortic valve), advanced 10/29.  Echo images obtained 10/30 (able to get better images).  Impella still short, advanced and left at 3.9 cm. LDH now trending down.  Impella: P4 Flow 2.5 L/min Heparin gtt LDH 1177 => 782 => 632 Plts 88, down from 160 10/28  [place] numbers: CVP 9-10 PA 31/19 CI 4.55 SVR 478  Co-ox 67%  CXR 10/30: Bibasilar opacities.  Repeat today pending. Tm 100.2 yesterday am, continues w/ thick secretions.  WBCs 9.0.  Objective:   Weight Range: 114.8 kg Body mass index is 33.39 kg/m.   Vital Signs:   Temp:  [98.4 F (36.9 C)-100.2 F (37.9 C)] 99.5 F (37.5 C) (10/31 0700) Pulse Rate:  [84-108] 101 (10/31 0700) Resp:  [13-22] 17 (10/31 0700) BP: (103-147)/(54-90) 147/76 (10/31 0700) SpO2:  [97 %-100 %] 100 % (10/31 0700) FiO2 (%):  [30 %-50 %] 30 % (10/31 0332) Weight:  [114.8 kg] 114.8 kg (10/31 0400) Last BM Date: 10/06/21  Weight change: Filed Weights   10/08/21 0348 10/09/21 0445 10/10/21 0400  Weight: 112.5 kg 113.2 kg 114.8 kg    Intake/Output:   Intake/Output Summary (Last 24 hours) at 10/10/2021 0730 Last data filed at 10/10/2021 0700 Gross per 24 hour  Intake 3433.72 ml  Output 1680 ml  Net 1753.72 ml      Physical Exam   CVP 9-10 General:  Sedated on vent HEENT: + ETT Neck: supple. + JVD. Carotids 2+ bilat; no bruits. No lymphadenopathy or thryomegaly appreciated. Cor: PMI nondisplaced. Regular rate & rhythm. No rubs, gallops or murmurs. Lungs: clear Abdomen: soft, + distended.  Extremities: no cyanosis, clubbing, rash, edema,  + Impella Neuro: sedated    Telemetry   Sinus 80s - 90s, no further VT (personally reviewed  Labs    CBC Recent Labs    10/09/21 1657 10/10/21 0354  WBC 8.2 9.0  HGB 10.4* 10.7*  HCT 32.6* 33.6*  MCV 84.5 85.1  PLT 92* 88*   Basic Metabolic Panel Recent Labs    10/09/21 0402 10/09/21 0823 10/10/21 0354  NA 137  --  137  K 4.0  --  4.4  CL 103  --  104  CO2 24  --  24  GLUCOSE 192*  --  193*  BUN 49*  --  61*  CREATININE 2.44*  --  2.20*  CALCIUM 8.6*  --  8.8*  MG  --  2.6* 2.3  PHOS 4.4  --  3.5   Liver Function Tests No results for input(s): AST, ALT, ALKPHOS, BILITOT, PROT, ALBUMIN in the last 72 hours. No results for input(s): LIPASE, AMYLASE in the last 72 hours. Cardiac Enzymes No results for input(s): CKTOTAL, CKMB, CKMBINDEX, TROPONINI in the last 72 hours.  BNP: BNP (last 3 results) Recent Labs    10/04/2021 0659  BNP 344.3*    ProBNP (last 3 results) No results for input(s): PROBNP in the last 8760 hours.   D-Dimer No results for input(s): DDIMER in the last 72 hours. Hemoglobin A1C Recent Labs  09/22/2021 2025  HGBA1C 6.1*   Fasting Lipid Panel No results for input(s): CHOL, HDL, LDLCALC, TRIG, CHOLHDL, LDLDIRECT in the last 72 hours. Thyroid Function Tests No results for input(s): TSH, T4TOTAL, T3FREE, THYROIDAB in the last 72 hours.  Invalid input(s): FREET3  Other results:   Imaging    DG CHEST PORT 1 VIEW  Result Date: 10/09/2021 CLINICAL DATA:  Respiratory failure. EXAM: PORTABLE CHEST 1 VIEW COMPARISON:  10/08/2021 and prior studies FINDINGS: An endotracheal tube with tip 3.9 cm above the carina, NG tube entering the stomach with tip off field of view and Impella device again noted. Pulmonary vascular congestion with mild interstitial pulmonary edema again noted. Slightly increasing bibasilar atelectasis/opacities noted with probable small bilateral pleural effusions. There is no evidence of pneumothorax. IMPRESSION:  Interstitial pulmonary edema again noted with slightly increasing bibasilar atelectasis/opacities with probable small bilateral pleural effusions. Electronically Signed   By: Margarette Canada M.D.   On: 10/09/2021 12:17   ECHOCARDIOGRAM LIMITED  Result Date: 10/09/2021    ECHOCARDIOGRAM LIMITED REPORT   Patient Name:   Warren Pellicane Noell Sr. Date of Exam: 10/09/2021 Medical Rec #:  [id number]                Height:       73.0 in Accession #:    [id number]               Weight:       249.6 lb Date of Birth:  07/18/1951                 BSA:          2.364 m Patient Age:    43 years                 BP:           111/68 mmHg Patient Gender: M                        HR:           105 bpm. Exam Location:  Inpatient Procedure: Limited Echo STAT ECHO Indications:    Impella placement  History:        Patient has prior history of Echocardiogram examinations, most                 recent 10/08/2021. Previous Myocardial Infarction and CAD,                 Stroke; Risk Factors:Hypertension, Diabetes and Dyslipidemia.  Sonographer:    Merrie Roof RDCS Referring Phys: [place]  1. Limited study to assess impella placement; impella 3-3.4 cm from aortic valve. FINDINGS  Pericardium: There is no evidence of pericardial effusion. Additional Comments: Limited study to assess impella placement; impella 3-3.4 cm from aortic valve. A venous catheter is visualized. Kirk Ruths MD Electronically signed by Kirk Ruths MD Signature Date/Time: 10/09/2021/11:16:59 AM    Final      Medications:     Scheduled Medications:  amiodarone  200 mg Per Tube BID   aspirin  81 mg Per Tube Daily   atorvastatin  80 mg Per Tube Daily   chlorhexidine gluconate (MEDLINE KIT)  15 mL Mouth Rinse BID   Chlorhexidine Gluconate Cloth  6 each Topical Daily   docusate  100 mg Per Tube BID   feeding supplement (PROSource TF)  45 mL Per Tube TID   fentaNYL (SUBLIMAZE) injection  25 mcg  Intravenous Once   insulin aspart  0-15  Units Subcutaneous Q4H   insulin glargine-yfgn  8 Units Subcutaneous QHS   mouth rinse  15 mL Mouth Rinse 10 times per day   mupirocin ointment  1 application Nasal BID   pantoprazole sodium  40 mg Per Tube Daily   polyethylene glycol  17 g Per Tube Daily   sodium chloride flush  10-40 mL Intracatheter Q12H   sodium chloride flush  3 mL Intravenous Q12H   ticagrelor  90 mg Per Tube BID    Infusions:  sodium chloride 10 mL/hr at 10/10/21 0700   ceFEPime (MAXIPIME) IV Stopped (10/09/21 2229)   feeding supplement (VITAL HIGH PROTEIN) 1,000 mL (10/10/21 0600)   fentaNYL infusion INTRAVENOUS 100 mcg/hr (10/10/21 0700)   heparin 50 units/mL (Impella PURGE) in dextrose 5 % 1000 mL bag     heparin 400 Units/hr (10/10/21 0700)   midazolam 2.5 mg/hr (10/10/21 0700)   milrinone 0.125 mcg/kg/min (10/10/21 0700)   norepinephrine (LEVOPHED) Adult infusion Stopped (10/08/21 2349)   vancomycin      PRN Medications: sodium chloride, acetaminophen (TYLENOL) oral liquid 160 mg/5 mL, fentaNYL, midazolam, sodium chloride flush, sodium chloride flush   Assessment/Plan   1. CAD: Prior inferior MI and placement of 2 stents to RCA in 1996.  Admitted with NSTEMI, found to have 75% distal LM stenosis with occluded proximal LAD and ostial LCx.  S/p PCI distal LM through mid LAD, ostial LCx remains occluded.  - ASA + Brilinta.  - Atorvastatin.  2. Acute systolic CHF/cardiogenic shock: Ischemic cardiomyopathy.  Bedside echo with EF 20% range.  Impella placed in cath lab with cardiogenic shock.  Currently with Impella CP at P6, no alarms.  Impella short on echo yesterday (inflow near aortic valve), was withdrawn.  Assessed again by echo this morning, still relatively short and advanced to stable position at 3.9 cm. Urine now clearing with LDH trending down.  CI 4.5, co-ox 67%.  SVR 478. CVP 9-10.  He remains on milrinone 0.125. MAP stable.  - Impella at  P4 today, hopefully can wean off soon. Heparin gtt for  Impella. Follow platelets, 172 > 94 > 88 - Continue milrinone 0.125.  - Hold digoxin with AKI 3. NSVT/PVCs: Frequent initially now has settled down on amiodarone gtt.    - Now on po amio 4. Acute hypoxemic respiratory failure: Intubated 10/27. CXR 10/30 with bibasilar infiltrates.  Also with low grade fever to 100.2 (99 this am) and thick secretions.  - Procalcitonin 0.18, blood cultures pending. GPC and GNR on sputum, final report pending. - On empiric Abx => vancomycin/cefepime.  5. Type 2 diabetes: SSI.  6. AKI: In setting of cardiogenic shock.   Scr 2.4 > 2.2. Baseline 1.1 7. Anemia: Hgb 13 => 11, suspect hemolysis with Impella plays a role. Position adjusted.   -Increased hematuria this am 8. Thrombocytopenia: -Plt 88, follow -Likely d/t impella + heparin gtt  Length of Stay: 3  FINCH, LINDSAY N, PA-C  10/10/2021, 7:30 AM  Advanced Heart Failure Team Pager [phone] (M-F; 7a - 5p)  Please contact [facility] for night-coverage after hours (5p -7a ) and weekends on [email or web address]   Agree with above.   He remains intubated. Sedated. Will arouse. On Impella P-4 (waveforms ok) and milrinone 0.25. Hemodynamics improved. Having thick secretions in sputum. On vanc/cefipime. SCr beginning to improve. LDH coming down to 632. + hematuria   General:  Intubated/sedated HEENT: normal + ETT Neck: supple. no JVD. Carotids  2+ bilat; no bruits. No lymphadenopathy or thryomegaly appreciated. Cor: PMI nondisplaced. Regular rate & rhythm. No rubs, gallops or murmurs. Lungs: clear Abdomen: soft, nontender, nondistended. No hepatosplenomegaly. No bruits or masses. Good bowel sounds. Extremities: no cyanosis, clubbing, rash, edema + RFA impella and RFV swan  Neuro: intubated sedated  Hemodynamically improved with milrinone and impella. Will drop impella to P-3. If tolerates will plan device removal today. Continue abx for HCAP. Hopefully can wean vent soon.   CRITICAL CARE Performed by:  Glori Bickers  Total critical care time: 45 minutes  Critical care time was exclusive of separately billable procedures and treating other patients.  Critical care was necessary to treat or prevent imminent or life-threatening deterioration.  Critical care was time spent personally by me (independent of midlevel providers or residents) on the following activities: development of treatment plan with patient and/or surrogate as well as nursing, discussions with consultants, evaluation of patient's response to treatment, examination of patient, obtaining history from patient or surrogate, ordering and performing treatments and interventions, ordering and review of laboratory studies, ordering and review of radiographic studies, pulse oximetry and re-evaluation of patient's condition.  Glori Bickers, MD  8:58 AM

## 2021-10-10 NOTE — Progress Notes (Signed)
  Impella pulled at 1p this afternoon.  Patient seen on evening ICU rounds.   Remains intubated. BP stable.  Swam numbers checked personally and remained stable.   Fem stop now off. RFA site stable. No hematoma or oozing. RLE pulses remains strong by Doppler.   Discussed with CCM and plan for possible extubation in the am.   Additional CCT  40 mins.   Glori Bickers, MD  10:44 PM

## 2021-10-10 NOTE — Progress Notes (Signed)
ANTICOAGULATION CONSULT NOTE - Follow Up Consult  Pharmacy Consult for heparin Indication:  Impella CP  Allergies  Allergen Reactions   Pravachol [Pravastatin Sodium] Rash   Statins Rash    Pt reports only pravastatin causes rash - takes atorvastatin currently    Patient Measurements: Height: 6\' 1"  (185.4 cm) Weight: 113.2 kg (249 lb 9 oz) IBW/kg (Calculated) : 79.9 Heparin Dosing Weight: 103.4 kg   Vital Signs: Temp: 99.5 F (37.5 C) (10/31 0430) Temp Source: Core (10/31 0000) BP: 141/90 (10/31 0430) Pulse Rate: 108 (10/31 0430)  Labs: Recent Labs    10/09/2021 0659 10/04/2021 0920 10/06/2021 1055 10/02/2021 1933 10/08/21 0409 10/08/21 1700 10/09/21 0402 10/09/21 0823 10/09/21 1657 10/10/21 0354  HGB 14.4  --   --    < > 13.2  --  11.3* 10.6* 10.4* 10.7*  HCT 44.4  --   --    < > 41.0  --  34.8* 33.2* 32.6* 33.6*  PLT 160  --   --   --  172  --  PLATELET CLUMPS NOTED ON SMEAR, UNABLE TO ESTIMATE 94* 92* 88*  APTT  --   --  31  --   --   --   --   --   --   --   LABPROT  --   --  15.1  --   --   --   --   --   --   --   INR  --   --  1.2  --   --   --   --   --   --   --   HEPARINUNFRC  --   --   --    < > <0.10*  --  <0.10*  --  <0.10* <0.10*  CREATININE 1.13  --   --    < > 1.43* 2.12* 2.44*  --   --   --   TROPONINIHS 1,296* 9,629*  --   --   --   --   --   --   --   --    < > = values in this interval not displayed.     Estimated Creatinine Clearance: 37.1 mL/min (A) (by C-G formula based on SCr of 2.44 mg/dL (H)).   Assessment: 3 yom presenting to [facility] - found to have NSTEMI now s/p impella supported PCI with DES to distal LM through mid LAD. Had respiratory failure, requiring intubation during cath, also found to be in cardiogenic shock - transferred to [facility] on impella. No AC PTA.   Impella purge (heparin 50 units/ml) with flow rate 9.9 mL/hr = heparin 495 uts/hr, purge pressure low 500s. Heparin level remains undetectable on systemic heparin 300 units/hr. No  issues with line or bleeding reported per RN. Plt down to 88, Hgb stable in 10s.  Goal of Therapy:  Heparin level 0.2-0.5 units/ml  Monitor platelets by anticoagulation protocol: Yes   Plan:  Continue heparin purge solution (50 units/mL concentration) Increase heparin drip to 400 units/hr - titrate slowly  Q12h Heparin level , CBC, LDH and for s/sx of bleeding Monitor Impella weaning    Sherlon Handing, PharmD, BCPS Please see amion for complete clinical pharmacist phone list 10/10/2021 4:55 AM

## 2021-10-11 ENCOUNTER — Inpatient Hospital Stay: Payer: Self-pay

## 2021-10-11 ENCOUNTER — Other Ambulatory Visit: Payer: Self-pay

## 2021-10-11 ENCOUNTER — Inpatient Hospital Stay (HOSPITAL_COMMUNITY): Payer: Medicare Other

## 2021-10-11 ENCOUNTER — Other Ambulatory Visit (HOSPITAL_COMMUNITY): Payer: Self-pay

## 2021-10-11 DIAGNOSIS — J9601 Acute respiratory failure with hypoxia: Secondary | ICD-10-CM | POA: Diagnosis not present

## 2021-10-11 DIAGNOSIS — R570 Cardiogenic shock: Secondary | ICD-10-CM | POA: Diagnosis not present

## 2021-10-11 LAB — BASIC METABOLIC PANEL
Anion gap: 11 (ref 5–15)
BUN: 63 mg/dL — ABNORMAL HIGH (ref 8–23)
CO2: 25 mmol/L (ref 22–32)
Calcium: 8.8 mg/dL — ABNORMAL LOW (ref 8.9–10.3)
Chloride: 104 mmol/L (ref 98–111)
Creatinine, Ser: 1.88 mg/dL — ABNORMAL HIGH (ref 0.61–1.24)
GFR, Estimated: 38 mL/min — ABNORMAL LOW (ref >60)
Glucose, Bld: 187 mg/dL — ABNORMAL HIGH (ref 70–99)
Potassium: 3.9 mmol/L (ref 3.5–5.1)
Sodium: 140 mmol/L (ref 135–145)

## 2021-10-11 LAB — CBC
HCT: 32.6 % — ABNORMAL LOW (ref 39.0–52.0)
Hemoglobin: 10.5 g/dL — ABNORMAL LOW (ref 13.0–17.0)
MCH: 27.1 pg (ref 26.0–34.0)
MCHC: 32.2 g/dL (ref 30.0–36.0)
MCV: 84.2 fL (ref 80.0–100.0)
Platelets: 92 10*3/uL — ABNORMAL LOW (ref 150–400)
RBC: 3.87 MIL/uL — ABNORMAL LOW (ref 4.22–5.81)
RDW: 14.6 % (ref 11.5–15.5)
WBC: 7.7 10*3/uL (ref 4.0–10.5)
nRBC: 0 % (ref 0.0–0.2)

## 2021-10-11 LAB — CULTURE, RESPIRATORY W GRAM STAIN

## 2021-10-11 LAB — COOXEMETRY PANEL
Carboxyhemoglobin: 1.2 % (ref 0.5–1.5)
Methemoglobin: 0.9 % (ref 0.0–1.5)
O2 Saturation: 69.7 %
Total hemoglobin: 10.4 g/dL — ABNORMAL LOW (ref 12.0–16.0)

## 2021-10-11 LAB — GLUCOSE, CAPILLARY
Glucose-Capillary: 131 mg/dL — ABNORMAL HIGH (ref 70–99)
Glucose-Capillary: 189 mg/dL — ABNORMAL HIGH (ref 70–99)
Glucose-Capillary: 188 mg/dL — ABNORMAL HIGH (ref 70–99)
Glucose-Capillary: 197 mg/dL — ABNORMAL HIGH (ref 70–99)

## 2021-10-11 LAB — PHOSPHORUS: Phosphorus: 3.4 mg/dL (ref 2.5–4.6)

## 2021-10-11 LAB — LACTATE DEHYDROGENASE: LDH: 511 U/L — ABNORMAL HIGH (ref 98–192)

## 2021-10-11 LAB — MAGNESIUM: Magnesium: 2 mg/dL (ref 1.7–2.4)

## 2021-10-11 MED ORDER — SODIUM CHLORIDE 0.9% FLUSH
10.0000 mL | Freq: Two times a day (BID) | INTRAVENOUS | Status: DC
  Administered 2021-10-11 – 2021-10-13 (×4): 10 mL

## 2021-10-11 MED ORDER — ORAL CARE MOUTH RINSE
15.0000 mL | Freq: Two times a day (BID) | OROMUCOSAL | Status: DC
  Administered 2021-10-11 – 2021-10-16 (×8): 15 mL via OROMUCOSAL

## 2021-10-11 MED ORDER — POTASSIUM CHLORIDE 20 MEQ PO PACK
40.0000 meq | PACK | Freq: Two times a day (BID) | ORAL | Status: DC
  Administered 2021-10-11 – 2021-10-15 (×9): 40 meq
  Filled 2021-10-11 (×9): qty 2

## 2021-10-11 MED ORDER — HYDRALAZINE HCL 20 MG/ML IJ SOLN: 10.0000 mg | INTRAMUSCULAR | Status: DC | PRN

## 2021-10-11 MED ORDER — SODIUM CHLORIDE 0.9% FLUSH: 10.0000 mL | INTRAVENOUS | Status: DC | PRN

## 2021-10-11 MED ORDER — RACEPINEPHRINE HCL 2.25 % IN NEBU
INHALATION_SOLUTION | RESPIRATORY_TRACT | Status: AC
  Administered 2021-10-11: 0.5 mL via RESPIRATORY_TRACT
  Filled 2021-10-11: qty 0.5

## 2021-10-11 MED ORDER — FUROSEMIDE 10 MG/ML IJ SOLN
60.0000 mg | Freq: Once | INTRAMUSCULAR | Status: AC
  Administered 2021-10-11: 60 mg via INTRAVENOUS
  Filled 2021-10-11: qty 6

## 2021-10-11 MED ORDER — RACEPINEPHRINE HCL 2.25 % IN NEBU: 0.5000 mL | INHALATION_SOLUTION | Freq: Once | RESPIRATORY_TRACT | Status: AC

## 2021-10-11 MED ORDER — SODIUM CHLORIDE 0.9 % IV SOLN
1.0000 g | INTRAVENOUS | Status: AC
Start: 2021-10-11 — End: 2021-10-13
  Administered 2021-10-11 – 2021-10-13 (×3): 1 g via INTRAVENOUS
  Filled 2021-10-11 (×3): qty 10

## 2021-10-11 NOTE — Progress Notes (Signed)
NAME:  Warren Lumpkin Sr., MRN:  [id number], DOB:  1951/07/29, LOS: 4 ADMISSION DATE:  09/21/2021, CONSULTATION DATE:  10/08/2021 REFERRING MD:  Bensimhon - Adv HF, CHIEF COMPLAINT:  Respiratory failure    History of Present Illness:  70 yo M PMH CAD s/p 2x stents RCA (1996), CVA, HTN, HLD, DM2, prostate cancer who presented to ED 10/28 with R sided chest pain, reportedly beginning 0400 10/28. Later developed SOB, improved with nitro (took nitro at home and again with EMS). In ED chest pain recurred, with associated SOB and diaphoresis, pain radiating down L arm. Found to have NSTEMI, develope respiratory distress and was intubated.  At [facility] he was taken to cath lab and found to have 75% distal LM occlusion, pLAD and Lcx occlusion. He underwent impella supported PCI / DES distal LM through mid LAD. LVEF 25%. Decision made to transfer to [name] for admission.  PCCM is consulted for vent management in setting of respiratory failure requiring intubation   Pertinent  Medical History  CAD HLD HTN DM2 CVA Significant Hospital Events: Including procedures, antibiotic start and stop dates in addition to other pertinent events   10/28 [facility] for chest pain. Taken to cath lab, ruled in for NSTEMI, intubated for resp distress, underwent impella supported PCI/DES with LM to LAD intervention. Transferred to [facility] for cardiogenic shock. PCCM consulted for vent  10/31 tolerating PSV 06/10/2021 attempting extubation  Interim History / Subjective:  Attempting extubation 10/11/2021  Objective   Blood pressure (!) 149/79, pulse (!) 103, temperature 99.7 F (37.6 C), resp. rate 20, height 6\' 1"  (1.854 m), weight 112.5 kg, SpO2 99 %. PAP: (19-64)/(10-33) 37/21 CVP:  [4 mmHg-17 mmHg] 7 mmHg CO:  [5.7 L/min-9.2 L/min] 5.7 L/min CI:  [2.4 L/min/m2-3.9 L/min/m2] 2.4 L/min/m2  Vent Mode: PSV;CPAP FiO2 (%):  [30 %] 30 % Set Rate:  [15 bmp] 15 bmp Vt Set:  [630 mL] 630 mL PEEP:  [5 cmH20] 5 cmH20 Pressure  Support:  [8 cmH20] 8 cmH20 Plateau Pressure:  [15 cmH20-16 cmH20] 16 cmH20   Intake/Output Summary (Last 24 hours) at 10/11/2021 1031 Last data filed at 10/11/2021 3428 Gross per 24 hour  Intake 2370 ml  Output 5975 ml  Net -3605 ml   Filed Weights   10/09/21 0445 10/10/21 0400 10/11/21 0343  Weight: 113.2 kg 114.8 kg 112.5 kg    Examination: General: Generally male no acute distress HEENT: Endotracheal tube in place no JVD Neuro:  CV: Heart sounds are distant PULM: Rhonchi bilaterally Vent support 5/5 with a rate of 15 tidal volume 630 FIO2 40% PEEP   GI: soft, bsx4 active  GU: Amber urine Extremities: warm/dry, 2+edema  Skin: no rashes or lesions    Resolved Hospital Problem list     Assessment & Plan:   Critically ill due to Acute respiratory failure with hypoxia requiring mechanical ventilation Acute pulmonary edema Likely bilateral pleural effusions Critically ill due to Cardiogenic shock, biventricular heart failure requiring milrinone and Impella support.  Acute HFrEF CAD w/ NSTEMI s/p DES to distal LMCA through mid LAD  HTN HLD AKI, improving Empirical abx    Plan:  10/11/2021 attempted extubation.  Failed earlier today will again attempt extubation today. Continue pulmonary toilet and empirical antimicrobial therapy  Diuresis as tolerated Note cardiac index 2.1 CVP was low at 6 today Continue milrinone per cardiology  Continue to monitor renal function Lab Results  Component Value Date   CREATININE 1.88 (H) 10/11/2021   CREATININE 2.20 (H)  10/10/2021   CREATININE 2.44 (H) 10/09/2021   CREATININE 1.15 03/14/2014   Considered streamlining antibiotics by stopping vancomycin      Best Practice (right click and "Reselect all SmartList Selections" daily)  Nutrition: Tube feeds. GI prophylaxis: PPI DVT prophylaxis: heparin  Foley: yes CVC: yes Aline: yes  Labs   CBC: Recent Labs  Lab 10/09/21 0402 10/09/21 0823 10/09/21 1657  10/10/21 0354 10/11/21 0415  WBC 9.7 9.3 8.2 9.0 7.7  HGB 11.3* 10.6* 10.4* 10.7* 10.5*  HCT 34.8* 33.2* 32.6* 33.6* 32.6*  MCV 83.9 84.5 84.5 85.1 84.2  PLT PLATELET CLUMPS NOTED ON SMEAR, UNABLE TO ESTIMATE 94* 92* 88* 92*    Basic Metabolic Panel: Recent Labs  Lab 09/12/2021 2111 10/08/21 0409 10/08/21 0840 10/08/21 1017 10/08/21 1700 10/09/21 0402 10/09/21 0823 10/10/21 0354 10/11/21 0415  NA 137 138  --   --  138 137  --  137 140  K 4.6 4.3  --   --  4.2 4.0  --  4.4 3.9  CL 105 106  --   --  103 103  --  104 104  CO2 23 23  --   --  25 24  --  24 25  GLUCOSE 172* 182*  --   --  194* 192*  --  193* 187*  BUN 24* 28*  --   --  36* 49*  --  61* 63*  CREATININE 1.24 1.43*  --   --  2.12* 2.44*  --  2.20* 1.88*  CALCIUM 8.2* 8.4*  --   --  8.5* 8.6*  --  8.8* 8.8*  MG 1.8  --  1.6*  --   --   --  2.6* 2.3 2.0  PHOS  --   --   --  4.2 4.3 4.4  --  3.5 3.4   GFR: Estimated Creatinine Clearance: 48 mL/min (A) (by C-G formula based on SCr of 1.88 mg/dL (H)). Recent Labs  Lab 09/17/2021 1847 10/08/21 0409 10/08/21 0840 10/09/21 0402 10/09/21 0823 10/09/21 1657 10/10/21 0354 10/11/21 0415  PROCALCITON  --   --   --   --  0.18  --   --   --   WBC  --    < >  --    < > 9.3 8.2 9.0 7.7  LATICACIDVEN 1.8  --  1.2  --   --   --   --   --    < > = values in this interval not displayed.    Liver Function Tests: No results for input(s): AST, ALT, ALKPHOS, BILITOT, PROT, ALBUMIN in the last 168 hours. No results for input(s): LIPASE, AMYLASE in the last 168 hours. No results for input(s): AMMONIA in the last 168 hours.  ABG    Component Value Date/Time   PHART 7.342 (L) 09/20/2021 1933   PCO2ART 49.6 (H) 09/12/2021 1933   PO2ART 119 (H) 10/10/2021 1933   HCO3 26.8 09/21/2021 1933   TCO2 28 09/11/2021 1933   ACIDBASEDEF 4.8 (H) 09/24/2021 1324   O2SAT 69.7 10/11/2021 0415      Coagulation Profile: Recent Labs  Lab 09/20/2021 1055  INR 1.2   App cct 45  min  Richardson Landry Claudio Mondry ACNP Acute Care Nurse Practitioner [place] Please consult Amion 10/11/2021, 10:31 AM

## 2021-10-11 NOTE — Progress Notes (Signed)
Peripherally Inserted Central Catheter Placement  The IV Nurse has discussed with the patient and/or persons authorized to consent for the patient, the purpose of this procedure and the potential benefits and risks involved with this procedure.  The benefits include less needle sticks, lab draws from the catheter, and the patient may be discharged home with the catheter. Risks include, but not limited to, infection, bleeding, blood clot (thrombus formation), and puncture of an artery; nerve damage and irregular heartbeat and possibility to perform a PICC exchange if needed/ordered by physician.  Alternatives to this procedure were also discussed.  Bard Power PICC patient education guide, fact sheet on infection prevention and patient information card has been provided to patient /or left at bedside.    PICC Placement Documentation  PICC Double Lumen 03/49/17 PICC Right Basilic 39 cm 0 cm (Active)  Indication for Insertion or Continuance of Line Prolonged intravenous therapies 10/11/21 1522  Exposed Catheter (cm) 0 cm 10/11/21 1522  Site Assessment Clean;Dry;Intact 10/11/21 1522  Lumen #1 Status Flushed;Saline locked;Blood return noted 10/11/21 1522  Lumen #2 Status Blood return noted;Saline locked;Flushed 10/11/21 1522  Dressing Type Transparent;Securing device 10/11/21 1522  Dressing Status Clean;Dry;Intact 10/11/21 1522  Antimicrobial disc in place? Yes 10/11/21 1522  Safety Lock Not Applicable 91/50/56 9794  Dressing Change Due 10/18/21 10/11/21 1522       Warren Stone 10/11/2021, 3:24 PM

## 2021-10-11 NOTE — TOC Benefit Eligibility Note (Addendum)
Patient Teacher, English as a foreign language completed.    The patient is currently admitted and upon discharge could be taking Jardiance 10 mg.  The current 30 day co-pay is, $45.00.    The patient is currently admitted and upon discharge could be taking Farxiga 10 mg.  The current 30 day co-pay is, $45.00.   The patient is currently admitted and upon discharge could be taking Brilinta 90 mg.  The current 30 day co-pay is, $45.00.   The patient is currently admitted and upon discharge could be taking Entresto 24-26 mg.  The current 30 day co-pay is, $45.00.   The patient is insured through [employer] [place] Patient Advocate Specialist [place] Patient Advocate Team Direct Number: [phone]  Fax: [phone]

## 2021-10-11 NOTE — Procedures (Signed)
Extubation Procedure Note  Patient Details:   Name: Warren Kauth Monte Sr. DOB: [birth year] MRN: [id number]   Airway Documentation:    Vent end date: 10/11/21 Vent end time: 1483   Evaluation  O2 sats: stable throughout Complications: No apparent complications Patient did tolerate procedure well. Bilateral Breath Sounds: Rhonchi, Inspiratory wheezes   Yes  Pt extubated to 3l [place]. Cuff leak present, stridor noted and treated with racemic epi. MD aware, RN at bedside, RT will continue to monitor.  [name] 10/11/2021, 1:08 PM

## 2021-10-11 NOTE — Discharge Summary (Signed)
Discharge Summary    Patient ID: Warren Hillier Hyde Sr. MRN: [id number]; DOB: 27-Jul-1951  Admit date: 10/01/2021 Discharge date: 10/11/2021  PCP:  [name], MD   [facility] Providers Cardiologist: Lujean Amel, MD   Discharge Diagnoses    Principal Problem:   Non-ST elevation (NSTEMI) myocardial infarction [facility]) Active Problems:   NSTEMI (non-ST elevated myocardial infarction) [facility])    Diagnostic Studies/Procedures    R/LHC/PCI/Impella CP placement (09/29/2021): Severe two-vessel coronary artery disease with 75% distal LMCA stenosis, as well as occlusions of proximal LAD and ostial LCx.  Mild-moderate disease also noted in ramus intermedius and RCA/PDA. Severely reduced LVEF and severely elevated left ventricular filling pressure in the setting of cardiogenic shock and acute decompensated heart failure. Severely elevated right heart and pulmonary artery pressures. Mildly reduced Fick cardiac output/index with Impella CP in place (suspect cardiac output would be severely reduced without hemodynamic support). Marked respiratory distress prior to PCI and Impella insertion requiring emergent intubation. Successful Impella supported PCI to distal LMCA through mid LAD using Onyx Frontier 2.75 x 38 mm drug-eluting stent with 0% residual stenosis and TIMI-3 flow.  Diagnostic Dominance: Right Intervention   _____________   History of Present Illness     Warren Rosenow Sr. is a 70 y.o. male with hx of coronary artery disease status post 2 stents placed in the 1990s, hypertension, hyperlipidemia, stroke, type 2 diabetes mellitus, kidney stones, and prostate cancer, presenting to [facility] emergency department on 10/01/2021 with cute onset of chest pain.  He woke up at 4:30 AM on the morning of presentation with chest pain rating to the back and upper neck with minimal improvement with nitroglycerin.  He presented to the emergency department where he continued to have  waxing and waning pain over the next few hours.  EKG showed subtle ST elevation in the anterior leads, not diagnostic for STEMI.  Due to continued chest pain and elevated troponin, patient was referred for urgent cardiac catheterization and possible PCI.  Hospital Course     Consultants: None   Patient underwent urgent cardiac catheterization, as outlined above.  This revealed severe LMCA, LAD, and LCx disease as well as severely reduced LVEF (less than 25%) and severely elevated LVEDP.  Impella CP was placed and PCI performed with drug-eluting stent placement from the distal LMCA into the mid LAD.  Due to worsening respiratory distress, the patient was intubated during the procedure.  Low-dose norepinephrine was also started due to hypotension following intubation.  Due to critical illness, patient will be transferred to [facility] for ongoing management of cardiogenic shock following high risk NSTEMI by the advanced heart failure team.  Did the patient have an acute coronary syndrome (MI, NSTEMI, STEMI, etc) this admission?:  Yes                               AHA/ACC Clinical Performance & Quality Measures: Aspirin prescribed? - Yes ADP Receptor Inhibitor (Plavix/Clopidogrel, Brilinta/Ticagrelor or Effient/Prasugrel) prescribed (includes medically managed patients)? - Yes Beta Blocker prescribed? - No - cardiogenic shock High Intensity Statin (Lipitor 40-80mg  or Crestor 20-40mg ) prescribed? - Yes EF assessed during THIS hospitalization? - Yes For EF <40%, was ACEI/ARB prescribed? - No - Reason:  cardiogenic shock For EF <40%, Aldosterone Antagonist (Spironolactone or Eplerenone) prescribed? - No - Reason:  cardiogenic shock Cardiac Rehab Phase II ordered (including medically managed patients)? - Yes  _____________  Discharge Vitals Blood pressure (!) 160/100, pulse (!) 113, temperature 97.8 F (36.6 C), temperature source Oral, resp. rate 20, height 6\' 1"  (1.854 m),  weight 111.6 kg, SpO2 99 %.  Filed Weights   09/16/2021 0651  Weight: 111.6 kg    Labs & Radiologic Studies    CBC Recent Labs    10/10/21 0354 10/11/21 0415  WBC 9.0 7.7  HGB 10.7* 10.5*  HCT 33.6* 32.6*  MCV 85.1 84.2  PLT 88* 92*   Basic Metabolic Panel Recent Labs    10/10/21 0354 10/11/21 0415  NA 137 140  K 4.4 3.9  CL 104 104  CO2 24 25  GLUCOSE 193* 187*  BUN 61* 63*  CREATININE 2.20* 1.88*  CALCIUM 8.8* 8.8*  MG 2.3 2.0  PHOS 3.5 3.4   Liver Function Tests No results for input(s): AST, ALT, ALKPHOS, BILITOT, PROT, ALBUMIN in the last 72 hours. No results for input(s): LIPASE, AMYLASE in the last 72 hours. High Sensitivity Troponin:   Recent Labs  Lab 10/01/2021 0659 09/15/2021 0920  TROPONINIHS 1,296* 7,889*    BNP Invalid input(s): POCBNP D-Dimer No results for input(s): DDIMER in the last 72 hours. Hemoglobin A1C No results for input(s): HGBA1C in the last 72 hours. Fasting Lipid Panel No results for input(s): CHOL, HDL, LDLCALC, TRIG, CHOLHDL, LDLDIRECT in the last 72 hours. Thyroid Function Tests No results for input(s): TSH, T4TOTAL, T3FREE, THYROIDAB in the last 72 hours.  Invalid input(s): FREET3 _____________  DG Chest 1 View  Result Date: 10/11/2021 CLINICAL DATA:  70 year old male status post placement of orogastric tube. EXAM: CHEST  1 VIEW COMPARISON:  Chest x-ray 10/10/2020. FINDINGS: An endotracheal tube is in place with tip 3.5 cm above the carina. A nasogastric tube is seen extending into the stomach, however, the tip of the nasogastric tube extends below the lower margin of the image. Catheter extending from the abdomen into the right atrium, with tip projecting over the mid to lower right hemithorax medially, presumably within a right-sided pulmonary arterial branch (although it is difficult to exclude the possibility of right atrial placement). Previously noted thoracic aortic Impella is not identified on today's examination. Lung  volumes are low. Bibasilar opacities (left greater than right), may reflect areas of atelectasis and/or consolidation. Small left pleural effusion. No definite right pleural effusion. No pneumothorax. Mild crowding of the pulmonary vasculature, without frank pulmonary edema. Heart size is mildly enlarged. Upper mediastinal contours are within normal limits. Atherosclerotic calcifications in the thoracic aorta. IMPRESSION: 1. Support apparatus, as above. 2. Low lung volumes with bibasilar (left greater than right) opacities which may reflect areas of atelectasis and/or consolidation. 3. Small left pleural effusion. 4. Aortic atherosclerosis. Electronically Signed   By: Vinnie Langton M.D.   On: 10/11/2021 06:57   DG Chest 1 View  Result Date: 10/08/2021 CLINICAL DATA:  Intubation EXAM: CHEST  1 VIEW COMPARISON:  09/29/2021 FINDINGS: Endotracheal tube terminates 4.5 cm above the carina. Cardiomegaly. Very mild perihilar edema is possible. No definite pleural effusions. No pleural effusion or pneumothorax. Suspected inferior post Swan-Ganz catheter terminating in the right main pulmonary artery. Defibrillator pad overlying the left hemithorax. Enteric tube courses into the stomach. IMPRESSION: Endotracheal tube terminates 4.5 cm above the carina. Possible mild perihilar edema. Additional support apparatus as above. Electronically Signed   By: Julian Hy M.D.   On: 10/08/2021 03:50   DG Chest 2 View  Result Date: 09/28/2021 CLINICAL DATA:  Chest pain EXAM: CHEST - 2  VIEW COMPARISON:  04/24/2009 FINDINGS: The heart size and mediastinal contours are within normal limits. Diffuse bilateral interstitial pulmonary opacity. The visualized skeletal structures are unremarkable. IMPRESSION: Diffuse bilateral interstitial pulmonary opacity, consistent with edema or infection. No focal airspace opacity. Electronically Signed   By: Delanna Ahmadi M.D.   On: 10/01/2021 07:25   CARDIAC CATHETERIZATION  Addendum  Date: 09/20/2021   Conclusion: Severe two-vessel coronary artery disease with 75% distal LMCA stenosis, as well as occlusions of proximal LAD and ostial LCx.  Mild-moderate disease also noted in ramus intermedius and RCA/PDA. Severely reduced LVEF and severely elevated left ventricular filling pressure in the setting of cardiogenic shock and acute decompensated heart failure. Severely elevated right heart and pulmonary artery pressures. Mildly reduced Fick cardiac output/index with Impella CP in place (suspect cardiac output would be severely reduced without hemodynamic support). Marked respiratory distress prior to PCI and Impella insertion requiring emergent intubation. Successful Impella supported PCI to distal LMCA through mid LAD using Onyx Frontier 2.75 x 38 mm drug-eluting stent with 0% residual stenosis and TIMI-3 flow. Recommendations: Continue dual antiplatelet therapy with aspirin and ticagrelor for at least 12 months. Discontinue cangrelor infusion 2 hours after ticagrelor load was given. Transfer to [name] for ongoing management/weaning of pressors and Impella per advanced heart failure service. Aggressive secondary prevention of coronary artery disease. Nelva Bush, MD [facility]  Result Date: 09/12/2021 Conclusion: Severe two-vessel coronary artery disease with 75% distal LMCA stenosis, as well as occlusions of proximal LAD and ostial LCx.  Mild-moderate disease also noted in ramus intermedius and RCA/PDA. Severely reduced LVEF and severely elevated left ventricular filling pressure. Marked respiratory distress prior to PCI and Impella insertion requiring emergent intubation. Successful Impella supported PCI to distal LMCA through mid LAD using Onyx Frontier 2.75 x 38 mm drug-eluting stent with 0% residual stenosis and TIMI-3 flow. Recommendations: Continue dual antiplatelet therapy with aspirin and ticagrelor for at least 12 months. Discontinue cangrelor infusion 2 hours after  ticagrelor load was given. Transfer to [name] for ongoing management/weaning of pressors and Impella per advanced heart failure service. Aggressive secondary prevention of coronary artery disease. Nelva Bush, MD [facility]  DG CHEST PORT 1 VIEW  Result Date: 10/10/2021 CLINICAL DATA:  Ventilator support.  Follow-up. EXAM: PORTABLE CHEST 1 VIEW COMPARISON:  10/09/2021 FINDINGS: Endotracheal tube tip 3 cm above the carina. Orogastric or nasogastric tube in place, but the location cannot be commonly established because of underpenetration. Swan-Ganz catheter tip in the main pulmonary artery or proximal right pulmonary artery. Bilateral lower lobe volume loss and pleural effusions persist. Findings are similar to yesterday, possibly minimally improved. IMPRESSION: Persistent effusions and basilar atelectasis. Possibly slightly improved. No worsening or new findings. Exact location of the nasogastric tube is uncertain because of underpenetration. Electronically Signed   By: Nelson Chimes M.D.   On: 10/10/2021 08:49   DG CHEST PORT 1 VIEW  Result Date: 10/09/2021 CLINICAL DATA:  Respiratory failure. EXAM: PORTABLE CHEST 1 VIEW COMPARISON:  10/08/2021 and prior studies FINDINGS: An endotracheal tube with tip 3.9 cm above the carina, NG tube entering the stomach with tip off field of view and Impella device again noted. Pulmonary vascular congestion with mild interstitial pulmonary edema again noted. Slightly increasing bibasilar atelectasis/opacities noted with probable small bilateral pleural effusions. There is no evidence of pneumothorax. IMPRESSION: Interstitial pulmonary edema again noted with slightly increasing bibasilar atelectasis/opacities with probable small bilateral pleural effusions. Electronically Signed   By: Cleatis Polka.D.  On: 10/09/2021 12:17   ECHOCARDIOGRAM COMPLETE  Result Date: 10/08/2021    ECHOCARDIOGRAM REPORT   Patient Name:   Warren Sisler Shaw Sr. Date of Exam:  10/08/2021 Medical Rec #:  [id number]                Height:       73.0 in Accession #:    [id number]               Weight:       248.0 lb Date of Birth:  25-Jan-1951                 BSA:          2.358 m Patient Age:    8 years                 BP:           108/68 mmHg Patient Gender: M                        HR:           101 bpm. Exam Location:  Inpatient Procedure: 2D Echo, Cardiac Doppler and Color Doppler Indications:    CHF- Acute Systolic S49.67  History:        Patient has no prior history of Echocardiogram examinations.                 Previous Myocardial Infarction, Stroke; Risk                 Factors:Hypertension, Diabetes and Dyslipidemia. 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  Sonographer:    Alvino Chapel RCS Referring Phys: [phone] Wilmer Floor SIMMONS  Sonographer Comments: Difficult PLAX windows. Had another echo tech come scan also. Images at the [place] of study. IMPRESSIONS  1. Left ventricular ejection fraction, by estimation, is 20 to 25%. The left ventricle has severely decreased function. The left ventricle demonstrates regional wall motion abnormalities (suggestive of large LAD infarct with associated akinesis), wall motion is largel from the base only. There is mild left ventricular hypertrophy. There is no evidence of LV infarct. The Impella support device is not well visualized in relationship to the aortic annulus, but inlet is measured at 3.2 mm below the aortic  annulus slightly supeior that ideal  2. Right ventricular systolic function is normal. The right ventricular size is normal. Tricuspid regurgitation signal is inadequate for assessing PA pressure.  3. Left atrial size was mildly dilated.  4. A small pericardial effusion is present.  5. The mitral valve is normal in structure. No evidence of mitral valve regurgitation. No evidence of mitral stenosis.  6. The aortic valve was  not well visualized. Aortic valve regurgitation is mild.  7. The inferior vena cava is dilated in size with >50% respiratory variability, suggesting right atrial pressure of 8 mmHg. Conclusion(s)/Recommendation(s): No increase in pressors requirements and interval decrease to from P8 to P7, will not advance device. Will notify primary AHF MD. FINDINGS  Left Ventricle: Left ventricular ejection fraction, by estimation, is 20 to 25%. The left ventricle has severely decreased function. The left ventricle demonstrates regional wall motion abnormalities. The left ventricular internal cavity size was normal  in size.  There is mild left ventricular hypertrophy. Left ventricular diastolic parameters are indeterminate. Right Ventricle: The right ventricular size is normal. No increase in right ventricular wall thickness. Right ventricular systolic function is normal. Tricuspid regurgitation signal is inadequate for assessing PA pressure. Left Atrium: Left atrial size was mildly dilated. Right Atrium: Right atrial size was normal in size. Pericardium: A small pericardial effusion is present. Mitral Valve: The mitral valve is normal in structure. No evidence of mitral valve regurgitation. No evidence of mitral valve stenosis. Tricuspid Valve: The tricuspid valve is normal in structure. Tricuspid valve regurgitation is not demonstrated. Aortic Valve: The aortic valve was not well visualized. Aortic valve regurgitation is mild. Pulmonic Valve: The pulmonic valve was not well visualized. Pulmonic valve regurgitation is not visualized. No evidence of pulmonic stenosis. Aorta: The aortic root is normal in size and structure. Venous: The inferior vena cava is dilated in size with greater than 50% respiratory variability, suggesting right atrial pressure of 8 mmHg. IAS/Shunts: The atrial septum is grossly normal. Additional Comments: A venous catheter is visualized.  LEFT VENTRICLE PLAX 2D LVIDd:         5.00 cm LVIDs:         3.00  cm LV PW:         1.10 cm LV IVS:        1.10 cm LVOT diam:     2.40 cm LV SV:         57 LV SV Index:   24 LVOT Area:     4.52 cm  LV Volumes (MOD) LV vol d, MOD A2C: 124.0 ml LV vol d, MOD A4C: 143.0 ml LV vol s, MOD A2C: 84.1 ml LV vol s, MOD A4C: 69.9 ml LV SV MOD A2C:     39.9 ml LV SV MOD A4C:     143.0 ml LV SV MOD BP:      57.5 ml RIGHT VENTRICLE RV S prime:     15.30 cm/s TAPSE (M-mode): 2.4 cm LEFT ATRIUM             Index        RIGHT ATRIUM           Index LA diam:        4.00 cm 1.70 cm/m   RA Area:     14.60 cm LA Vol (A2C):   62.2 ml 26.38 ml/m  RA Volume:   33.80 ml  14.34 ml/m LA Vol (A4C):   84.6 ml 35.88 ml/m LA Biplane Vol: 76.8 ml 32.57 ml/m  AORTIC VALVE LVOT Vmax:   81.60 cm/s LVOT Vmean:  58.900 cm/s LVOT VTI:    0.125 m  AORTA Ao Root diam: 3.05 cm MITRAL VALVE MV Area (PHT): 4.49 cm    SHUNTS MV Decel Time: 169 msec    Systemic VTI:  0.12 m MV E velocity: 79.05 cm/s  Systemic Diam: 2.40 cm Rudean Haskell MD Electronically signed by Rudean Haskell MD Signature Date/Time: 10/08/2021/3:13:40 PM    Final    ECHOCARDIOGRAM LIMITED  Result Date: 10/09/2021    ECHOCARDIOGRAM LIMITED REPORT   Patient Name:   Warren Smolinsky Draughn Sr. Date of Exam: 10/09/2021 Medical Rec #:  [id number]                Height:       73.0 in Accession #:    [id number]               Weight:  249.6 lb Date of Birth:  11-26-1951                 BSA:          2.364 m Patient Age:    11 years                 BP:           111/68 mmHg Patient Gender: M                        HR:           105 bpm. Exam Location:  Inpatient Procedure: Limited Echo STAT ECHO Indications:    Impella placement  History:        Patient has prior history of Echocardiogram examinations, most                 recent 10/08/2021. Previous Myocardial Infarction and CAD,                 Stroke; Risk Factors:Hypertension, Diabetes and Dyslipidemia.  Sonographer:    Merrie Roof RDCS Referring Phys: [place]  1. Limited study to assess impella placement; impella 3-3.4 cm from aortic valve. FINDINGS  Pericardium: There is no evidence of pericardial effusion. Additional Comments: Limited study to assess impella placement; impella 3-3.4 cm from aortic valve. A venous catheter is visualized. Kirk Ruths MD Electronically signed by Kirk Ruths MD Signature Date/Time: 10/09/2021/11:16:59 AM    Final    [place] EKG SITE RITE  Result Date: 10/11/2021 If Site Rite image not attached, placement could not be confirmed due to current cardiac rhythm.  Disposition   Pt is being discharged home today in good condition.  Follow-up Plans & Appointments     Discharge Instructions     AMB Referral to Cardiac Rehabilitation - Phase II   Complete by: As directed    Diagnosis:  Coronary Stents NSTEMI     After initial evaluation and assessments completed: Virtual Based Care may be provided alone or in conjunction with Phase 2 Cardiac Rehab based on patient barriers.: Yes       Discharge Medications   Allergies as of 09/27/2021       Reactions   Pravachol [pravastatin Sodium]    Rash   Pravastatin Nausea And Vomiting   Other reaction(s): Unknown   Statins Rash   Pt reports only pravastatin causes rash - takes atorvastatin currently        Medication List     ASK your doctor about these medications    amLODipine 5 MG tablet Commonly known as: NORVASC Take 5 mg by mouth daily.   aspirin EC 81 MG tablet Take 81 mg by mouth daily.   atorvastatin 40 MG tablet Commonly known as: LIPITOR Take 20-40 mg by mouth See admin instructions. Take 1/2 tablet (20 mg) every other day, alternating with 1 tablet (40 mg) every other day   colchicine 0.6 MG tablet Take 0.6 mg by mouth daily as needed (gout flare). Reported on 11/25/2015   colestipol 1 g tablet Commonly known as: COLESTID Take 1 g by mouth 2 (two) times daily.   fenofibrate 160 MG tablet Take 160 mg by mouth daily.    Fish Oil 1000 MG Caps Take 2,000 mg by mouth 3 (three) times daily.   hydrochlorothiazide 25 MG tablet Commonly known as: HYDRODIURIL Take 25 mg by mouth daily.   metoprolol 200 MG 24  hr tablet Commonly known as: TOPROL-XL Take 200 mg by mouth daily.   pantoprazole 20 MG tablet Commonly known as: PROTONIX Take 1 tablet (20 mg total) by mouth daily.   ramipril 10 MG capsule Commonly known as: ALTACE Take 10 mg by mouth daily.   vitamin B-12 1000 MCG tablet Commonly known as: CYANOCOBALAMIN Take 1,000 mcg by mouth daily.   vitamin C 1000 MG tablet Take 1,000 mg by mouth daily.   Vitamin D3 25 MCG (1000 UT) Caps Take 1,000 Units by mouth daily.           Outstanding Labs/Studies   None  Duration of Discharge Encounter   Greater than 30 minutes including physician time.  Signed, Nelva Bush, MD 10/11/2021, 5:36 PM

## 2021-10-11 NOTE — Progress Notes (Addendum)
Patient ID: Warren Difrancesco Flenner Sr., male   DOB: [birth year], 70 y.o.   MRN: [id number]     Advanced Heart Failure Rounding Note  PCP-Cardiologist: None   Subjective:    Remains on vent. Failed vent wean this am due to sedation. CXR improved  Impella pulled 10/31  On milrinone 0.125. Co-ox 70% SBP 120-140  [place]wan #s CVP 7 PA 37/19 (27) PCWP UTW Thermo 5.7/2.4    Objective:   Weight Range: 112.5 kg Body mass index is 32.72 kg/m.   Vital Signs:   Temp:  [98.4 F (36.9 C)-99.9 F (37.7 C)] 99.1 F (37.3 C) (11/01 0745) Pulse Rate:  [81-118] 87 (11/01 0745) Resp:  [10-23] 15 (11/01 0745) BP: (102-159)/(61-90) 125/66 (11/01 0700) SpO2:  [95 %-100 %] 98 % (11/01 0745) FiO2 (%):  [30 %] 30 % (11/01 0725) Weight:  [112.5 kg] 112.5 kg (11/01 0343) Last BM Date: 10/06/21  Weight change: Filed Weights   10/09/21 0445 10/10/21 0400 10/11/21 0343  Weight: 113.2 kg 114.8 kg 112.5 kg    Intake/Output:   Intake/Output Summary (Last 24 hours) at 10/11/2021 0810 Last data filed at 10/11/2021 0700 Gross per 24 hour  Intake 2595.7 ml  Output 4925 ml  Net -2329.3 ml       Physical Exam   CVP 7 General:  Intubated/sedated HEENT: normal + ETT Neck: supple. JVP 7  Carotids 2+ bilat; no bruits. No lymphadenopathy or thryomegaly appreciated. Cor: PMI nondisplaced. Regular rate & rhythm. No rubs, gallops or murmurs. Lungs: clear Abdomen: soft, nontender, nondistended. No hepatosplenomegaly. No bruits or masses. Good bowel sounds. Extremities: no cyanosis, clubbing, rash, tr edema  RFA site ok. RFV swan  Neuro: sedated on vent    Telemetry   Sinus 80-90s Personally reviewed  Labs    CBC Recent Labs    10/10/21 0354 10/11/21 0415  WBC 9.0 7.7  HGB 10.7* 10.5*  HCT 33.6* 32.6*  MCV 85.1 84.2  PLT 88* 92*    Basic Metabolic Panel Recent Labs    10/10/21 0354 10/11/21 0415  NA 137 140  K 4.4 3.9  CL 104 104  CO2 24 25  GLUCOSE 193* 187*  BUN 61* 63*   CREATININE 2.20* 1.88*  CALCIUM 8.8* 8.8*  MG 2.3 2.0  PHOS 3.5 3.4    Liver Function Tests No results for input(s): AST, ALT, ALKPHOS, BILITOT, PROT, ALBUMIN in the last 72 hours. No results for input(s): LIPASE, AMYLASE in the last 72 hours. Cardiac Enzymes No results for input(s): CKTOTAL, CKMB, CKMBINDEX, TROPONINI in the last 72 hours.  BNP: BNP (last 3 results) Recent Labs    09/10/2021 0659  BNP 344.3*     ProBNP (last 3 results) No results for input(s): PROBNP in the last 8760 hours.   D-Dimer No results for input(s): DDIMER in the last 72 hours. Hemoglobin A1C No results for input(s): HGBA1C in the last 72 hours.  Fasting Lipid Panel No results for input(s): CHOL, HDL, LDLCALC, TRIG, CHOLHDL, LDLDIRECT in the last 72 hours. Thyroid Function Tests No results for input(s): TSH, T4TOTAL, T3FREE, THYROIDAB in the last 72 hours.  Invalid input(s): FREET3  Other results:   Imaging    DG Chest 1 View  Result Date: 10/11/2021 CLINICAL DATA:  70 year old male status post placement of orogastric tube. EXAM: CHEST  1 VIEW COMPARISON:  Chest x-ray 10/10/2020. FINDINGS: An endotracheal tube is in place with tip 3.5 cm above the carina. A nasogastric tube is seen extending into the stomach,  however, the tip of the nasogastric tube extends below the lower margin of the image. Catheter extending from the abdomen into the right atrium, with tip projecting over the mid to lower right hemithorax medially, presumably within a right-sided pulmonary arterial branch (although it is difficult to exclude the possibility of right atrial placement). Previously noted thoracic aortic Impella is not identified on today's examination. Lung volumes are low. Bibasilar opacities (left greater than right), may reflect areas of atelectasis and/or consolidation. Small left pleural effusion. No definite right pleural effusion. No pneumothorax. Mild crowding of the pulmonary vasculature, without frank  pulmonary edema. Heart size is mildly enlarged. Upper mediastinal contours are within normal limits. Atherosclerotic calcifications in the thoracic aorta. IMPRESSION: 1. Support apparatus, as above. 2. Low lung volumes with bibasilar (left greater than right) opacities which may reflect areas of atelectasis and/or consolidation. 3. Small left pleural effusion. 4. Aortic atherosclerosis. Electronically Signed   By: Vinnie Langton M.D.   On: 10/11/2021 06:57     Medications:     Scheduled Medications:  amiodarone  200 mg Per Tube BID   aspirin  81 mg Per Tube Daily   atorvastatin  80 mg Per Tube Daily   chlorhexidine gluconate (MEDLINE KIT)  15 mL Mouth Rinse BID   Chlorhexidine Gluconate Cloth  6 each Topical Daily   docusate  100 mg Per Tube BID   enoxaparin (LOVENOX) injection  40 mg Subcutaneous Q24H   feeding supplement (PROSource TF)  45 mL Per Tube TID   fentaNYL (SUBLIMAZE) injection  25 mcg Intravenous Once   insulin aspart  0-15 Units Subcutaneous Q4H   insulin glargine-yfgn  8 Units Subcutaneous QHS   mouth rinse  15 mL Mouth Rinse 10 times per day   mupirocin ointment  1 application Nasal BID   pantoprazole sodium  40 mg Per Tube Daily   polyethylene glycol  17 g Per Tube Daily   sodium chloride flush  10-40 mL Intracatheter Q12H   sodium chloride flush  3 mL Intravenous Q12H   ticagrelor  90 mg Per Tube BID    Infusions:  sodium chloride 10 mL/hr at 10/11/21 0700   ceFEPime (MAXIPIME) IV Stopped (10/10/21 2155)   feeding supplement (VITAL HIGH PROTEIN) 60 mL/hr at 10/11/21 0801   fentaNYL infusion INTRAVENOUS 150 mcg/hr (10/11/21 0758)   midazolam 2 mg/hr (10/11/21 0700)   milrinone 0.125 mcg/kg/min (10/11/21 0700)   norepinephrine (LEVOPHED) Adult infusion Stopped (10/10/21 1317)   vancomycin Stopped (10/10/21 1143)    PRN Medications: sodium chloride, acetaminophen (TYLENOL) oral liquid 160 mg/5 mL, fentaNYL, midazolam, sodium chloride flush, sodium chloride  flush   Assessment/Plan   1. CAD/NSTEMI:  Prior inferior MI and placement of 2 stents to RCA in 1996.  Admitted with NSTEMI, found to have 75% distal LM stenosis with occluded proximal LAD and ostial LCx.  S/p PCI distal LM through mid LAD, ostial LCx remains occluded. No s/s angina.  - ASA + Brilinta.  - Atorvastatin.  2. Acute systolic CHF -> cardiogenic shock: Ischemic cardiomyopathy.  EF 20-25% on echo. RV normal.  Impella placed in cath lab with cardiogenic shock.  Impella pulled 10/31 - Hemodynamics look good on milrinone 0.125. Will continue. - Possible start low-dose Entresto tomorrow if renal function continues to improve - Give one dose of lasix 60 IV today - Pull swan today - Place PICC either today or tomorrow as needed 3. NSVT/PVCs: Frequent initially now has settled down on amiodarone gtt.    -  Now on po amio 4. Acute hypoxemic respiratory failure with pulmonary edema and HCAP: Intubated 10/27. CXR 10/30 with bibasilar infiltrates.  Also with low grade fever to 100.2 and thick secretions.  - Procalcitonin 0.18, blood cultures NGTD. GPC and GNR on sputum, (rare group B strep) - On empiric Abx => vancomycin/cefepime per CCM - wean vent today. Hopefully can extubate  5. Type 2 diabetes: SSI.  - SGLT2i prior to d/c 6. AKI: In setting of cardiogenic shock.   Scr 2.4 > 2.2 -> 1.88. Baseline 1.1 7. Anemia, acute blood loss - hematuria persists. Suspect Foley trauma. - now off heparin.  - send urine cyctology  8. Thrombocytopenia: -Plt 88-> 92k. Improving after Impella out  CRITICAL CARE Performed by: Glori Bickers  Total critical care time: 45 minutes  Critical care time was exclusive of separately billable procedures and treating other patients.  Critical care was necessary to treat or prevent imminent or life-threatening deterioration.  Critical care was time spent personally by me (independent of midlevel providers or residents) on the following activities:  development of treatment plan with patient and/or surrogate as well as nursing, discussions with consultants, evaluation of patient's response to treatment, examination of patient, obtaining history from patient or surrogate, ordering and performing treatments and interventions, ordering and review of laboratory studies, ordering and review of radiographic studies, pulse oximetry and re-evaluation of patient's condition.   Length of Stay: [place], MD  10/11/2021, 8:10 AM  Advanced Heart Failure Team Pager [phone] (M-F; 7a - 5p)  Please contact [facility] for night-coverage after hours (5p -7a ) and weekends on [email or web address]

## 2021-10-11 NOTE — Progress Notes (Signed)
Patient extubated earlier today. Unable to pass bedside swallow screen. NP [facility] notified, orders to place NG tube for medications.

## 2021-10-11 DEATH — deceased

## 2021-10-12 ENCOUNTER — Inpatient Hospital Stay (HOSPITAL_COMMUNITY): Payer: Medicare Other

## 2021-10-12 DIAGNOSIS — J9601 Acute respiratory failure with hypoxia: Secondary | ICD-10-CM | POA: Diagnosis not present

## 2021-10-12 LAB — COOXEMETRY PANEL
Carboxyhemoglobin: 1.5 % (ref 0.5–1.5)
Methemoglobin: 0.9 % (ref 0.0–1.5)
O2 Saturation: 64.5 %
Total hemoglobin: 11.4 g/dL — ABNORMAL LOW (ref 12.0–16.0)

## 2021-10-12 LAB — BASIC METABOLIC PANEL
Anion gap: 11 (ref 5–15)
BUN: 62 mg/dL — ABNORMAL HIGH (ref 8–23)
CO2: 26 mmol/L (ref 22–32)
Calcium: 8.9 mg/dL (ref 8.9–10.3)
Chloride: 102 mmol/L (ref 98–111)
Creatinine, Ser: 1.61 mg/dL — ABNORMAL HIGH (ref 0.61–1.24)
GFR, Estimated: 46 mL/min — ABNORMAL LOW (ref >60)
Glucose, Bld: 164 mg/dL — ABNORMAL HIGH (ref 70–99)
Potassium: 3.7 mmol/L (ref 3.5–5.1)
Sodium: 139 mmol/L (ref 135–145)

## 2021-10-12 LAB — CBC
HCT: 34.4 % — ABNORMAL LOW (ref 39.0–52.0)
Hemoglobin: 11.2 g/dL — ABNORMAL LOW (ref 13.0–17.0)
MCH: 26.9 pg (ref 26.0–34.0)
MCHC: 32.6 g/dL (ref 30.0–36.0)
MCV: 82.7 fL (ref 80.0–100.0)
Platelets: 111 10*3/uL — ABNORMAL LOW (ref 150–400)
RBC: 4.16 MIL/uL — ABNORMAL LOW (ref 4.22–5.81)
RDW: 14.6 % (ref 11.5–15.5)
WBC: 8.3 10*3/uL (ref 4.0–10.5)
nRBC: 0 % (ref 0.0–0.2)

## 2021-10-12 LAB — GLUCOSE, CAPILLARY
Glucose-Capillary: 160 mg/dL — ABNORMAL HIGH (ref 70–99)
Glucose-Capillary: 162 mg/dL — ABNORMAL HIGH (ref 70–99)
Glucose-Capillary: 166 mg/dL — ABNORMAL HIGH (ref 70–99)
Glucose-Capillary: 170 mg/dL — ABNORMAL HIGH (ref 70–99)
Glucose-Capillary: 173 mg/dL — ABNORMAL HIGH (ref 70–99)
Glucose-Capillary: 173 mg/dL — ABNORMAL HIGH (ref 70–99)
Glucose-Capillary: 178 mg/dL — ABNORMAL HIGH (ref 70–99)
Glucose-Capillary: 179 mg/dL — ABNORMAL HIGH (ref 70–99)
Glucose-Capillary: 180 mg/dL — ABNORMAL HIGH (ref 70–99)

## 2021-10-12 LAB — MAGNESIUM: Magnesium: 2.2 mg/dL (ref 1.7–2.4)

## 2021-10-12 LAB — LACTATE DEHYDROGENASE: LDH: 447 U/L — ABNORMAL HIGH (ref 98–192)

## 2021-10-12 MED ORDER — VITAL 1.5 CAL PO LIQD
1000.0000 mL | ORAL | Status: DC
  Administered 2021-10-12 – 2021-10-16 (×5): 1000 mL
  Filled 2021-10-12 (×5): qty 1000

## 2021-10-12 MED ORDER — SPIRONOLACTONE 12.5 MG HALF TABLET: 12.5000 mg | ORAL_TABLET | Freq: Every day | ORAL | Status: DC

## 2021-10-12 MED ORDER — SODIUM CHLORIDE 0.9 % IR SOLN
3000.0000 mL | Status: DC
  Administered 2021-10-12: 3000 mL

## 2021-10-12 MED ORDER — FENTANYL CITRATE PF 50 MCG/ML IJ SOSY
25.0000 ug | PREFILLED_SYRINGE | Freq: Once | INTRAMUSCULAR | Status: AC
  Administered 2021-10-12: 25 ug via INTRAVENOUS
  Filled 2021-10-12: qty 1

## 2021-10-12 MED ORDER — SACUBITRIL-VALSARTAN 49-51 MG PO TABS
1.0000 | ORAL_TABLET | Freq: Two times a day (BID) | ORAL | Status: DC
  Administered 2021-10-12: 1
  Filled 2021-10-12 (×2): qty 1

## 2021-10-12 MED ORDER — SPIRONOLACTONE 12.5 MG HALF TABLET
12.5000 mg | ORAL_TABLET | Freq: Every day | ORAL | Status: DC
  Administered 2021-10-12: 12.5 mg
  Filled 2021-10-12: qty 1

## 2021-10-12 MED ORDER — PROSOURCE TF PO LIQD
45.0000 mL | Freq: Three times a day (TID) | ORAL | Status: DC
  Administered 2021-10-12 – 2021-10-16 (×13): 45 mL
  Filled 2021-10-12 (×13): qty 45

## 2021-10-12 MED ORDER — SACUBITRIL-VALSARTAN 24-26 MG PO TABS
1.0000 | ORAL_TABLET | Freq: Two times a day (BID) | ORAL | Status: DC
  Administered 2021-10-12 – 2021-10-14 (×4): 1 via ORAL
  Filled 2021-10-12 (×5): qty 1

## 2021-10-12 NOTE — Evaluation (Addendum)
Physical Therapy Evaluation Patient Details Name: Warren Stone Weichel Sr. MRN: [id number] DOB: 15-Oct-1951 Today's Date: 10/12/2021  History of Present Illness  Pt adm 10/28 with NSTEMI complicated by cardiogenic shock and acute systolic heart failure. Pt underwent PCI and impella insertion 10/28. Intubated 10/28-11/1. Impella removed 10/31.  PMH -  CAD, prostate CA, HTN, DM, CVA.  Clinical Impression  Pt presents to PT with a decrease in mobility due to weakness and decr balance. Pt has good potential and expect he will progress to a supervision/modified independent level with post acute rehab.        Recommendations for follow up therapy are one component of a multi-disciplinary discharge planning process, led by the attending physician.  Recommendations may be updated based on patient status, additional functional criteria and insurance authorization.  Follow Up Recommendations Acute inpatient rehab (3hours/day)    Assistance Recommended at Discharge Frequent or constant Supervision/Assistance (initially)  Functional Status Assessment Patient has had a recent decline in their functional status and demonstrates the ability to make significant improvements in function in a reasonable and predictable amount of time.  Equipment Recommendations  Rolling walker (2 wheels)    Recommendations for Other Services Rehab consult     Precautions / Restrictions Precautions Precautions: Fall Restrictions Weight Bearing Restrictions: No      Mobility  Bed Mobility Overal bed mobility: Needs Assistance Bed Mobility: Rolling;Supine to Sit Rolling: +2 for physical assistance;Total assist   Supine to sit: +2 for physical assistance;Total assist;HOB elevated     General bed mobility comments: Assist to bring legs off of bed, elevate trunk into sitting and bring hips to EOB.    Transfers Overall transfer level: Needs assistance Equipment used: Ambulation equipment used Transfers: Sit to/from  Stand Sit to Stand: +2 physical assistance;Min assist           General transfer comment: Assist to bring hips up and for balance. Incr time to rise and verbal/tactile cues to extend knees and hips. Transfer via Lift Equipment: Stedy  Ambulation/Gait                Stairs            Wheelchair Mobility    Modified Rankin (Stroke Patients Only)       Balance Overall balance assessment: Needs assistance Sitting-balance support: Bilateral upper extremity supported;Feet supported Sitting balance-Leahy Scale: Poor Sitting balance - Comments: Pt initially mod assist for balance and progressing to min guard with UE support Postural control: Posterior lean Standing balance support: Bilateral upper extremity supported Standing balance-Leahy Scale: Poor Standing balance comment: Stedy and min assist for static standing                             Pertinent Vitals/Pain Pain Assessment: No/denies pain    Home Living Family/patient expects to be discharged to:: Private residence Living Arrangements: Spouse/significant other Available Help at Discharge: Family;Available PRN/intermittently Type of Home: House Home Access: Stairs to enter Entrance Stairs-Rails: Right;Left Entrance Stairs-Number of Steps: 5 Alternate Level Stairs-Number of Steps: flight Home Layout: Two level;Able to live on main level with bedroom/bathroom Home Equipment: Kasandra Knudsen - single point Additional Comments: Wife works during the day    Prior Function Prior Level of Function : Independent/Modified Independent;Driving;History of Falls (last six months)             Mobility Comments: Uses cane       Hand Dominance   Dominant Hand: Right  Extremity/Trunk Assessment   Upper Extremity Assessment Upper Extremity Assessment: Defer to OT evaluation    Lower Extremity Assessment Lower Extremity Assessment: RLE deficits/detail;LLE deficits/detail RLE Deficits / Details:  grossly 3-/5 LLE Deficits / Details: grossly 3-/5       Communication   Communication: No difficulties  Cognition Arousal/Alertness: Awake/alert Behavior During Therapy: Flat affect Overall Cognitive Status: Impaired/Different from baseline Area of Impairment: Following commands;Problem solving;Safety/judgement;Attention                   Current Attention Level: Selective   Following Commands: Follows one step commands with increased time Safety/Judgement: Decreased awareness of safety   Problem Solving: Slow processing;Decreased initiation;Requires verbal cues          General Comments General comments (skin integrity, edema, etc.): VSS on RA    Exercises     Assessment/Plan    PT Assessment Patient needs continued PT services  PT Problem List Decreased strength;Decreased activity tolerance;Decreased mobility;Decreased balance;Decreased knowledge of use of DME       PT Treatment Interventions DME instruction;Gait training;Functional mobility training;Stair training;Therapeutic activities;Therapeutic exercise;Balance training;Cognitive remediation;Patient/family education    PT Goals (Current goals can be found in the Care Plan section)  Acute Rehab PT Goals Patient Stated Goal: return home PT Goal Formulation: With patient Time For Goal Achievement: 10/26/21 Potential to Achieve Goals: Good    Frequency Min 3X/week   Barriers to discharge Decreased caregiver support;Inaccessible home environment stairs to enter and wife works    Co-evaluation PT/OT/SLP Co-Evaluation/Treatment: Yes Reason for Co-Treatment: Complexity of the patient's impairments (multi-system involvement);For patient/therapist safety PT goals addressed during session: Mobility/safety with mobility;Balance         AM-PAC PT "6 Clicks" Mobility  Outcome Measure Help needed turning from your back to your side while in a flat bed without using bedrails?: Total Help needed moving from  lying on your back to sitting on the side of a flat bed without using bedrails?: Total Help needed moving to and from a bed to a chair (including a wheelchair)?: Total Help needed standing up from a chair using your arms (e.g., wheelchair or bedside chair)?: Total Help needed to walk in hospital room?: Total Help needed climbing 3-5 steps with a railing? : Total 6 Click Score: 6    End of Session   Activity Tolerance: Patient limited by fatigue Patient left: in chair;with call bell/phone within reach;with chair alarm set Nurse Communication: Mobility status PT Visit Diagnosis: Other abnormalities of gait and mobility (R26.89);Muscle weakness (generalized) (M62.81)    Time: 1572-6203 PT Time Calculation (min) (ACUTE ONLY): 29 min   Charges:   PT Evaluation $PT Eval Moderate Complexity: [place] Pager [phone] Office [place] 10/12/2021, 11:07 AM

## 2021-10-12 NOTE — Progress Notes (Signed)
[place] Progress Note Patient Name: Warren Barrick Hascall Sr. DOB: 1951/06/29 MRN: [id number]   Date of Service  10/12/2021  HPI/Events of Note  Pt has chest and back pain; non-cardiac in presentation  eICU Interventions  APAP provided only partial relief; added 1x dose of fent 23mcg IV     Intervention Category Intermediate Interventions: Pain - evaluation and management  [place] 10/12/2021, 3:46 AM

## 2021-10-12 NOTE — TOC Initial Note (Addendum)
Transition of Care (TOC) - Initial/Assessment Note    Patient Details  Name: Warren Harton Nahar Sr. MRN: [id number] Date of Birth: [birth year]  Transition of Care [facility]) CM/SW Contact:    Erenest Rasher, RN Phone Number: [phone] 10/12/2021, 11:55 AM  Clinical Narrative:                 HF TOC CM spoke to pt and wife, Lelan Pons at beside. States pt was independent at home prior to hospital stay. She is agreeable to IP rehab or SNF. PT recommended IP rehab. Updated attending for CIR referral. Pt has cane at home.   Home Health is arranged with [facility], spoke to rep, [name]. Accepted referral. Pt may progress to needing HH at dc. Hoyle Sauer will continue to follow after CIR/IP rehab for [facility] needs.   Expected Discharge Plan: IP Rehab Facility Barriers to Discharge: Continued Medical Work up   Patient Goals and CMS Choice Patient states their goals for this hospitalization and ongoing recovery are:: wants patient to get back to his baseline CMS Medicare.gov Compare Post Acute Care list provided to:: Patient Represenative (must comment) (wife, Lelan Pons) Choice offered to / list presented to : Spouse  Expected Discharge Plan and Services Expected Discharge Plan: [place] In-house Referral: Clinical Social Work Discharge Planning Services: CM Consult Post Acute Care Choice: IP Rehab Living arrangements for the past 2 months: Single Family Home   Prior Living Arrangements/Services Living arrangements for the past 2 months: Single Family Home Lives with:: Spouse Patient language and need for interpreter reviewed:: Yes        Need for Family Participation in Patient Care: Yes (Comment) Care giver support system in place?: Yes (comment) Current home services: DME (cane) Criminal Activity/Legal Involvement Pertinent to Current Situation/Hospitalization: No - Comment as needed  Activities of Daily Living Home Assistive Devices/Equipment: Cane (specify quad or straight)  (straight) ADL Screening (condition at time of admission) Patient's cognitive ability adequate to safely complete daily activities?: Yes Is the patient deaf or have difficulty hearing?: No Does the patient have difficulty seeing, even when wearing glasses/contacts?: Yes Does the patient have difficulty concentrating, remembering, or making decisions?: No Patient able to express need for assistance with ADLs?: Yes Does the patient have difficulty dressing or bathing?: No Independently performs ADLs?: Yes (appropriate for developmental age) Does the patient have difficulty walking or climbing stairs?: No Weakness of Legs: None Weakness of Arms/Hands: None  Permission Sought/Granted Permission sought to share information with : Case Manager, Family Supports, PCP, Customer service manager Permission granted to share information with : Yes, Verbal Permission Granted  Share Information with NAME: Dwayne Bulkley  Permission granted to share info w AGENCY: IP rehab, SNF, Home Health, DME agencies  Permission granted to share info w Relationship: wife  Permission granted to share info w Contact Information: # [phone]  Emotional Assessment Appearance:: Appears stated age Attitude/Demeanor/Rapport: Gracious, Engaged Affect (typically observed): Accepting Orientation: : Oriented to Self, Oriented to Place, Oriented to  Time, Oriented to Situation   Psych Involvement: No (comment)  Admission diagnosis:  Cardiogenic shock ([place]) [R57.0] Patient Active Problem List   Diagnosis Date Noted   Non-ST elevation (NSTEMI) myocardial infarction ([place]) 10/01/2021   NSTEMI (non-ST elevated myocardial infarction) ([place]) 09/12/2021   Cardiogenic shock ([place]) 09/16/2021   Benign prostatic hyperplasia with lower urinary tract symptoms 01/05/2020   Radiation proctitis    Internal hemorrhoids    Diverticulosis of large intestine without diverticulitis  Rectal polyp    Ectopic gastric tissue     Esophageal polyp    Blood in stool 04/30/2018   Arteriosclerosis of coronary artery 10/29/2015   Diabetes mellitus, type 2 ([place]) 10/29/2015   Type 2 diabetes mellitus ([place]) 10/29/2015   Acute gouty arthritis 04/08/2015   H/O renal calculi 04/08/2015   CA of prostate ([place]) 04/01/2015   Malignant neoplasm of prostate ([place]) 04/01/2015   Essential (primary) hypertension 09/30/2014   Episodic tension type headache 09/10/2014   Adiposity 09/10/2014   Bilateral tinnitus 09/10/2014   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[place]) 06/23/2014   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[place]) 06/23/2014   Cephalalgia 05/06/2014   Buzzing in ear 05/06/2014   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

## 2021-10-12 NOTE — Progress Notes (Addendum)
Patient ID: Warren Rapaport Palo Sr., male   DOB: 06-28-1951, 70 y.o.   MRN: [id number]     Advanced Heart Failure Rounding Note  PCP-Cardiologist: None   Subjective:   10/31: Impella pulled 11/01: Extubated  Failed swallow eval yesterday.  Luiz Blare out. PICC placed 11/01.   On milrinone 0.125. Co-ox 65%  CVP  3-4  BP okay.   -3L yesterday with IV lasix. Weight down another 9 lb.   Still has some hematuria.   Scr trending back down, 1.1 > 2.4 > 1.6  Platelets improving after impella out, 92 > 111   Objective:   Weight Range: 107.6 kg Body mass index is 31.3 kg/m.   Vital Signs:   Temp:  [98.5 F (36.9 C)-100 F (37.8 C)] 98.7 F (37.1 C) (11/02 0400) Pulse Rate:  [80-111] 84 (11/02 0700) Resp:  [12-27] 19 (11/02 0700) BP: (126-159)/(65-87) 126/87 (11/02 0700) SpO2:  [96 %-100 %] 98 % (11/02 0700) Weight:  [107.6 kg] 107.6 kg (11/02 0500) Last BM Date: 11/06/21  Weight change: Filed Weights   10/10/21 0400 10/11/21 0343 10/12/21 0500  Weight: 114.8 kg 112.5 kg 107.6 kg    Intake/Output:   Intake/Output Summary (Last 24 hours) at 10/12/2021 0748 Last data filed at 10/12/2021 0700 Gross per 24 hour  Intake 738.56 ml  Output 3760 ml  Net -3021.44 ml      Physical Exam   CVP 3-4 General:  Well appearing. No resp difficulty HEENT: +NG Neck: supple. no JVD. Carotids 2+ bilat; no bruits. No lymphadenopathy or thryomegaly appreciated. Cor: PMI nondisplaced. Regular rate & rhythm. No rubs, gallops or murmurs. Lungs: clear Abdomen: soft, nontender, nondistended. No hepatosplenomegaly. No bruits or masses. Good bowel sounds. Extremities: no cyanosis, clubbing, rash, edema, + RUE PICC Neuro: alert & orientedx3, cranial nerves grossly intact. moves all 4 extremities w/o difficulty. Affect pleasant    Telemetry   SR 90s - low 100s  Labs    CBC Recent Labs    10/11/21 0415 10/12/21 0308  WBC 7.7 8.3  HGB 10.5* 11.2*  HCT 32.6* 34.4*  MCV 84.2 82.7   PLT 92* 680*   Basic Metabolic Panel Recent Labs    10/10/21 0354 10/11/21 0415 10/12/21 0308  NA 137 140 139  K 4.4 3.9 3.7  CL 104 104 102  CO2 _0 GLUCOSE 193* 187* 164*  BUN 61* 63* 62*  CREATININE 2.20* 1.88* 1.61*  CALCIUM 8.8* 8.8* 8.9  MG 2.3 2.0 2.2  PHOS 3.5 3.4  --    Liver Function Tests No results for input(s): AST, ALT, ALKPHOS, BILITOT, PROT, ALBUMIN in the last 72 hours. No results for input(s): LIPASE, AMYLASE in the last 72 hours. Cardiac Enzymes No results for input(s): CKTOTAL, CKMB, CKMBINDEX, TROPONINI in the last 72 hours.  BNP: BNP (last 3 results) Recent Labs    09/22/2021 0659  BNP 344.3*    ProBNP (last 3 results) No results for input(s): PROBNP in the last 8760 hours.   D-Dimer No results for input(s): DDIMER in the last 72 hours. Hemoglobin A1C No results for input(s): HGBA1C in the last 72 hours.  Fasting Lipid Panel No results for input(s): CHOL, HDL, LDLCALC, TRIG, CHOLHDL, LDLDIRECT in the last 72 hours. Thyroid Function Tests No results for input(s): TSH, T4TOTAL, T3FREE, THYROIDAB in the last 72 hours.  Invalid input(s): FREET3  Other results:   Imaging    DG Abd Portable 1V  Result Date: 10/11/2021 CLINICAL DATA:  Nasal/orogastric tube  placement. EXAM: PORTABLE ABDOMEN - 1 VIEW COMPARISON:  10/06/2010.  CT, 03/01/2015.  Current chest radiograph. FINDINGS: Nasal/orogastric tube has been placed, extending well below the diaphragm, curling in mid stomach. Lung base opacities are similar to the earlier study consistent with atelectasis possibly due to infection. Suspect small effusions. IMPRESSION: Well-positioned nasal/orogastric tube. Electronically Signed   By: David  Ormond M.D.   On: 10/11/2021 19:20   US EKG SITE RITE  Result Date: 10/11/2021 If Site Rite image not attached, placement could not be confirmed due to current cardiac rhythm.    Medications:     Scheduled Medications:  amiodarone  200 mg Per  Tube BID   aspirin  81 mg Per Tube Daily   atorvastatin  80 mg Per Tube Daily   chlorhexidine gluconate (MEDLINE KIT)  15 mL Mouth Rinse BID   Chlorhexidine Gluconate Cloth  6 each Topical Daily   docusate  100 mg Per Tube BID   enoxaparin (LOVENOX) injection  40 mg Subcutaneous Q24H   insulin aspart  0-15 Units Subcutaneous Q4H   insulin glargine-yfgn  8 Units Subcutaneous QHS   mouth rinse  15 mL Mouth Rinse BID   mupirocin ointment  1 application Nasal BID   polyethylene glycol  17 g Per Tube Daily   potassium chloride  40 mEq Per Tube BID   sodium chloride flush  10-40 mL Intracatheter Q12H   sodium chloride flush  10-40 mL Intracatheter Q12H   sodium chloride flush  3 mL Intravenous Q12H   ticagrelor  90 mg Per Tube BID    Infusions:  sodium chloride 5 mL/hr at 10/12/21 0700   cefTRIAXone (ROCEPHIN)  IV Stopped (10/11/21 1305)   milrinone 0.125 mcg/kg/min (10/12/21 0700)    PRN Medications: sodium chloride, acetaminophen (TYLENOL) oral liquid 160 mg/5 mL, hydrALAZINE, sodium chloride flush, sodium chloride flush, sodium chloride flush   Assessment/Plan   1. CAD/NSTEMI:  Prior inferior MI and placement of 2 stents to RCA in 1996.  Admitted with NSTEMI, found to have 75% distal LM stenosis with occluded proximal LAD and ostial LCx.  S/p PCI distal LM through mid LAD, ostial LCx remains occluded. No s/s angina.  - ASA + Brilinta.  - Atorvastatin.  2. Acute systolic CHF -> cardiogenic shock: Ischemic cardiomyopathy.  EF 20-25% on echo. RV normal.  Impella placed in cath lab with cardiogenic shock.  Impella pulled 10/31 - co-ox 65% on 0.125 milrinone - CVP 3-4. Will hold off on further diuresis for today - Scr improving. Add entresto 24/26 mg BID - Sinus/sinus tach 90s - low 100s. ? Benefit of digoxin if renal function continues to improve.  - No beta blocker d/t recent shock 3. NSVT/PVCs: Frequent initially now has settled down on amiodarone gtt.    - Now on po amio 4.  Acute hypoxemic respiratory failure with pulmonary edema and HCAP: Intubated 10/27. CXR 10/30 with bibasilar infiltrates.  Also with low grade fever to 100.2 and thick secretions. AF today - Procalcitonin 0.18, blood cultures NGTD. GPC and GNR on sputum, (rare group B strep) - Now on ceftriaxone - Extubated 11/01. Swallow eval today 5. Type 2 diabetes: SSI.  - SGLT2i prior to d/c 6. AKI: In setting of cardiogenic shock.   Scr 2.4 > 2.2 -> 1.88 ->1.6. Baseline 1.1 7. Anemia, acute blood loss - Hgb stable. Still has some hematuria. Suspect Foley trauma. - now off heparin. On DVT dose lovenox. 8. Thrombocytopenia: -Plt 88-> 92->111K. Improving after Impella out     Will likely need CIR once stable enough for discharge  Length of Stay: 5  FINCH, LINDSAY N, PA-C  10/12/2021, 7:48 AM  Advanced Heart Failure Team Pager [phone] (M-F; 7a - 5p)  Please contact [facility] for night-coverage after hours (5p -7a ) and weekends on [email or web address]   Extubated. Sitting up in chair. Swan out. Cor-trak in. Denies CP. + cough. Co-ox ok on low-dose milrinone  General:  Weak appearing. + cough HEENT: normal + cor-trak Neck: supple. no JVD. Carotids 2+ bilat; no bruits. No lymphadenopathy or thryomegaly appreciated. Cor: PMI nondisplaced. Regular rate & rhythm. No rubs, gallops or murmurs. Lungs:  coarse Abdomen: obese soft, nontender, nondistended. No hepatosplenomegaly. No bruits or masses. Good bowel sounds. Extremities: no cyanosis, clubbing, rash, tr edema Neuro: alert & orientedx3, cranial nerves grossly intact. moves all 4 extremities w/o difficulty. Affect pleasant  He remains tenuous. Co-ox ok on milrinone. Will continue one more day. Start entresto and spiro 125. Diurese as tolerated.    , MD  4:26 PM     

## 2021-10-12 NOTE — Procedures (Signed)
Cortrak  Person Inserting Tube:  Alroy Dust, Jimi Giza L, RD Tube Type:  Cortrak - 43 inches Tube Size:  10 Tube Location:  Right nare Initial Placement:  Stomach Secured by: Bridle Technique Used to Measure Tube Placement:  Marking at nare/corner of mouth Cortrak Secured At:  70 cm  Cortrak Tube Team Note:  Consult received to place a Cortrak feeding tube.   X-ray is required, abdominal x-ray has been ordered by the Cortrak team. Please confirm tube placement before using the Cortrak tube.   If the tube becomes dislodged please keep the tube and contact the Cortrak team at [email or web address] ([email or web address]) for replacement.  If after hours and replacement cannot be delayed, place a NG tube and confirm placement with an abdominal x-ray.    Hermina Barters BS, PLDN Clinical Dietitian See [facility] for contact information.

## 2021-10-12 NOTE — Progress Notes (Signed)
Nutrition Follow-up  DOCUMENTATION CODES:   Obesity unspecified  INTERVENTION:   Tube Feeding via Cortrak:  Vital 1.5 at 60 ml/hr Pro-Source TF 45 mL TID Provides 2280 kcals, 130 g of protein, 1094 mL of free water   Consider more aggressive bowel regimen given no recent BM  NUTRITION DIAGNOSIS:   Inadequate oral intake related to inability to eat as evidenced by NPO status.  Being addressed via TF   GOAL:   Provide needs based on ASPEN/SCCM guidelines  Progressing  MONITOR:   Vent status, TF tolerance, Skin, Weight trends, Labs, I & O's  REASON FOR ASSESSMENT:   Consult Enteral/tube feeding initiation and management  ASSESSMENT:   70 y.o. male with history of CAD s/p MI with placement of 2 stents in RCA, prostate cancer, HTN, DM type II, HLD, hx CVA. Presents with complaints of chest pressure and dyspnea. Admitted with NSTEMI complicated by cardiogenic shock and acute systolic HF. Underwent impella.  10/28 Impella placed 10/31 Impella removed 11/01 Extubation 11/02 Cortrak placed  NPO, SLP following but pt not safe for diet advancement at this time  Weight down to 107.6 kg; admit wt 114.2 kg. Unsure of dry weight. Net negative 3 L  No recent BM; noted bowel regimen ordered since 10/28, no changes  Labs: CBGs 162-197,  Creatinine  Meds: ss novolog, colace, smiralax, emglee, KCL   Diet Order:   Diet Order     None       EDUCATION NEEDS:   Not appropriate for education at this time  Skin:  Skin Assessment: Reviewed RN Assessment  Last BM:  10/27  Height:   Ht Readings from Last 1 Encounters:  09/17/2021 6\' 1"  (1.854 m)    Weight:   Wt Readings from Last 1 Encounters:  10/12/21 107.6 kg     BMI:  Body mass index is 31.3 kg/m.  Estimated Nutritional Needs:   Kcal:  2100-2400 kcals  Protein:  115-145 g  Fluid:  1.8 L   Kerman Passey MS, RDN, LDN, CNSC Registered Dietitian III Clinical Nutrition RD Pager and On-Call Pager Number  Located in [place]

## 2021-10-12 NOTE — Evaluation (Signed)
Clinical/Bedside Swallow Evaluation Patient Details  Name: Warren Stone Tomei Sr. MRN: [id number] Date of Birth: Nov 27, 1951  Today's Date: 10/12/2021 Time: SLP Start Time (ACUTE ONLY): 0957 SLP Stop Time (ACUTE ONLY): 1010 SLP Time Calculation (min) (ACUTE ONLY): 13 min  Past Medical History:  Past Medical History:  Diagnosis Date   Arteriosclerosis of coronary artery 10/29/2015   Overview:  with mi    Arthritis    BPH (benign prostatic hyperplasia)    Diabetes mellitus without complication ([place])    Gout    Heart attack ([place]) 1996   Hypertension    Prostate cancer ([place])    Stroke [facility])    Past Surgical History:  Past Surgical History:  Procedure Laterality Date   CHOLECYSTECTOMY     COLONOSCOPY N/A 05/01/2018   Procedure: COLONOSCOPY;  Surgeon: Virgel Manifold, MD;  Location: [facility] ENDOSCOPY;  Service: Endoscopy;  Laterality: N/A;   CORONARY STENT INTERVENTION W/IMPELLA N/A 10/09/2021   Procedure: Coronary Stent Intervention w/Impella;  Surgeon: Nelva Bush, MD;  Location: [place] CV LAB;  Service: Cardiovascular;  Laterality: N/A;   ESOPHAGOGASTRODUODENOSCOPY N/A 05/01/2018   Procedure: ESOPHAGOGASTRODUODENOSCOPY (EGD);  Surgeon: Virgel Manifold, MD;  Location: [facility] ENDOSCOPY;  Service: Endoscopy;  Laterality: N/A;   RIGHT HEART CATHETERIZATION WITH ADENOSINE STUDY  1996   RIGHT/LEFT HEART CATH AND CORONARY ANGIOGRAPHY N/A 09/20/2021   Procedure: RIGHT/LEFT HEART CATH AND CORONARY ANGIOGRAPHY;  Surgeon: Nelva Bush, MD;  Location: [place] CV LAB;  Service: Cardiovascular;  Laterality: N/A;   VENTRICULAR ASSIST DEVICE INSERTION N/A 09/29/2021   Procedure: VENTRICULAR ASSIST DEVICE INSERTION;  Surgeon: Nelva Bush, MD;  Location: [place] CV LAB;  Service: Cardiovascular;  Laterality: N/A;   HPI:  70 year old man who remains critically ill due to acute hypoxic and hypercarbic respiratory failure require mechanical ventilation as well as  acute decompensated systolic heart failure requiring inotropic and vasopressor support. Intubated 10/28-11/1. NSTEMI    Assessment / Plan / Recommendation  Clinical Impression  Pt demonstrates immediate coughing and throat clearing with expectoration of thick secretions with any PO trials. Gagging and coughing very uncomfortable to pt. Ice is likely loosening secretions following extubation. There may be decreased airway protection given dysphonia, also due to firm NG. Recommend pt continue to be allowed ice today to allow moisture and swallowing opportunities with low risk. Pt not ready for PO diet. Suggest placement of Cortrak and f/u for trials, expect rapid recovery this week. SLP Visit Diagnosis: Dysphagia, oropharyngeal phase (R13.12)    Aspiration Risk       Diet Recommendation Alternative means - temporary;Ice chips PRN after oral care   Medication Administration: Via alternative means Supervision: Staff to assist with self feeding    Other  Recommendations Oral Care Recommendations: Oral care BID Other Recommendations: Have oral suction available    Recommendations for follow up therapy are one component of a multi-disciplinary discharge planning process, led by the attending physician.  Recommendations may be updated based on patient status, additional functional criteria and insurance authorization.  Follow up Recommendations None      Frequency and Duration            Prognosis        Swallow Study   General HPI: 70 year old man who remains critically ill due to acute hypoxic and hypercarbic respiratory failure require mechanical ventilation as well as acute decompensated systolic heart failure requiring inotropic and vasopressor support. Intubated 10/28-11/1. NSTEMI Type of Study: Bedside Swallow Evaluation Diet Prior to this Study: NPO;NG  Tube Temperature Spikes Noted: No Respiratory Status: Room air History of Recent Intubation: Yes Length of Intubations (days):  5 days Date extubated: 10/11/21 Behavior/Cognition: Alert;Cooperative;Pleasant mood Oral Cavity Assessment: Within Functional Limits Oral Care Completed by SLP: No Oral Cavity - Dentition: Edentulous;Dentures, not available Vision: Functional for self-feeding Self-Feeding Abilities: Able to feed self Patient Positioning: Upright in bed Baseline Vocal Quality: Hoarse Volitional Cough: Strong Volitional Swallow: Able to elicit    Oral/Motor/Sensory Function Overall Oral Motor/Sensory Function: Within functional limits   Ice Chips Ice chips: Impaired Pharyngeal Phase Impairments: Cough - Immediate;Throat Clearing - Immediate   Thin Liquid Thin Liquid: Impaired Presentation: Straw Pharyngeal  Phase Impairments: Cough - Immediate;Throat Clearing - Immediate    Nectar Thick Nectar Thick Liquid: Not tested   Honey Thick Honey Thick Liquid: Not tested   Puree Puree: Not tested   Solid     Solid: Not tested      Elier Zellars, Katherene Ponto 10/12/2021,9:27 AM

## 2021-10-12 NOTE — Progress Notes (Signed)
NAME:  Warren Furio Sr., MRN:  [id number], DOB:  20-Aug-1951, LOS: 5 ADMISSION DATE:  10/05/2021, CONSULTATION DATE:  10/02/2021 REFERRING MD:  Bensimhon - Adv HF, CHIEF COMPLAINT:  Respiratory failure    History of Present Illness:  70 yo M PMH CAD s/p 2x stents RCA (1996), CVA, HTN, HLD, DM2, prostate cancer who presented to ED 10/28 with R sided chest pain, reportedly beginning 0400 10/28. Later developed SOB, improved with nitro (took nitro at home and again with EMS). In ED chest pain recurred, with associated SOB and diaphoresis, pain radiating down L arm. Found to have NSTEMI, develope respiratory distress and was intubated.  At [facility] he was taken to cath lab and found to have 75% distal LM occlusion, pLAD and Lcx occlusion. He underwent impella supported PCI / DES distal LM through mid LAD. LVEF 25%. Decision made to transfer to [name] for admission.  PCCM is consulted for vent management in setting of respiratory failure requiring intubation   Pertinent  Medical History  CAD HLD HTN DM2 CVA Significant Hospital Events: Including procedures, antibiotic start and stop dates in addition to other pertinent events   10/28 [facility] for chest pain. Taken to cath lab, ruled in for NSTEMI, intubated for resp distress, underwent impella supported PCI/DES with LM to LAD intervention. Transferred to [facility] for cardiogenic shock. PCCM consulted for vent  10/31 tolerating PSV 11/1 extubation  Interim History / Subjective:   Weaned off oxygen to room air.  Still unable to swallow effectively.  Blood pressure has improved, allowing for initiation of Entresto.  Objective   Blood pressure 136/80, pulse 90, temperature 98.8 F (37.1 C), temperature source Oral, resp. rate 19, height 6\' 1"  (1.854 m), weight 107.6 kg, SpO2 100 %. PAP: (28-38)/(12-21) 30/12 CVP:  [2 mmHg-11 mmHg] 3 mmHg      Intake/Output Summary (Last 24 hours) at 10/12/2021 1322 Last data filed at 10/12/2021 1224 Gross per 24  hour  Intake 194.34 ml  Output 2310 ml  Net -2115.66 ml    Filed Weights   10/10/21 0400 10/11/21 0343 10/12/21 0500  Weight: 114.8 kg 112.5 kg 107.6 kg    Examination: General: Obese male in no distress HEENT: Now extubated with no pressure injuries.  No JVD. Neuro: Awake alert oriented.  Generalized weakness.  Mild intention tremor. CV: Heart sounds are distant PULM: Clear bilaterally. GI: soft, bsx4 active  GU: Amber urine Extremities: warm/dry, 2+edema  Skin: no rashes or lesions   Limited point-of-care echocardiogram shows persistent LV dysfunction with an ejection fraction of approximately 25%.  Ancillary tests  Function continues to improve. Adequate glycemic control. SCV O2 has been downtrending since removal of Impella and discontinuation of norepinephrine.  Still acceptable at 64.5.  Assessment & Plan:   Critically ill due to Acute respiratory failure with hypoxia requiring mechanical ventilation Acute pulmonary edema Likely bilateral pleural effusions Critically ill due to Cardiogenic shock, biventricular heart failure requiring milrinone and Impella support.  Acute HFrEF CAD w/ NSTEMI s/p DES to distal LMCA through mid LAD  HTN HLD AKI, improving  Plan:  -Clinically euvolemic today.  No diuretic. -Introduce guideline directed heart failure therapy as per heart failure team. -Continue milrinone for now, watch SCV O2 stabilized. -Core track today to ensure nutrition.  Need ongoing speech therapy. -Progressive mobilization.  PT and OT have been ordered. -Adequate glycemic control -Complete 7 Days of Antibiotics for CAP  Best Practice (right click and "Reselect all SmartList Selections" daily)  Nutrition: Tube  feeds. GI prophylaxis: PPI DVT prophylaxis: heparin  Foley: yes CVC: Double-lumen PICC Aline: Removed  Labs   CBC: Recent Labs  Lab 10/09/21 0823 10/09/21 1657 10/10/21 0354 10/11/21 0415 10/12/21 0308  WBC 9.3 8.2 9.0 7.7 8.3  HGB  10.6* 10.4* 10.7* 10.5* 11.2*  HCT 33.2* 32.6* 33.6* 32.6* 34.4*  MCV 84.5 84.5 85.1 84.2 82.7  PLT 94* 92* 88* 92* 111*     Basic Metabolic Panel: Recent Labs  Lab 10/08/21 0840 10/08/21 1017 10/08/21 1700 10/09/21 0402 10/09/21 0823 10/10/21 0354 10/11/21 0415 10/12/21 0308  NA  --   --  138 137  --  137 140 139  K  --   --  4.2 4.0  --  4.4 3.9 3.7  CL  --   --  103 103  --  104 104 102  CO2  --   --  25 24  --  24 25 26   GLUCOSE  --   --  194* 192*  --  193* 187* 164*  BUN  --   --  36* 49*  --  61* 63* 62*  CREATININE  --   --  2.12* 2.44*  --  2.20* 1.88* 1.61*  CALCIUM  --   --  8.5* 8.6*  --  8.8* 8.8* 8.9  MG 1.6*  --   --   --  2.6* 2.3 2.0 2.2  PHOS  --  4.2 4.3 4.4  --  3.5 3.4  --     GFR: Estimated Creatinine Clearance: 55 mL/min (A) (by C-G formula based on SCr of 1.61 mg/dL (H)). Recent Labs  Lab 09/22/2021 1847 10/08/21 0409 10/08/21 0840 10/09/21 0402 10/09/21 0823 10/09/21 1657 10/10/21 0354 10/11/21 0415 10/12/21 0308  PROCALCITON  --   --   --   --  0.18  --   --   --   --   WBC  --    < >  --    < > 9.3 8.2 9.0 7.7 8.3  LATICACIDVEN 1.8  --  1.2  --   --   --   --   --   --    < > = values in this interval not displayed.    Liver Function Tests: No results for input(s): AST, ALT, ALKPHOS, BILITOT, PROT, ALBUMIN in the last 168 hours. No results for input(s): LIPASE, AMYLASE in the last 168 hours. No results for input(s): AMMONIA in the last 168 hours.  ABG    Component Value Date/Time   PHART 7.342 (L) 09/12/2021 1933   PCO2ART 49.6 (H) 09/11/2021 1933   PO2ART 119 (H) 09/24/2021 1933   HCO3 26.8 09/16/2021 1933   TCO2 28 10/08/2021 1933   ACIDBASEDEF 4.8 (H) 09/21/2021 1324   O2SAT 64.5 10/12/2021 0308    Coagulation Profile: Recent Labs  Lab 10/04/2021 1055  INR 1.2    Kipp Brood, MD [facility] ICU Physician [place]  Pager: [phone] Mobile: [phone] After hours: [phone].  10/12/2021, 1:22  PM

## 2021-10-12 NOTE — Evaluation (Signed)
Occupational Therapy Evaluation Patient Details Name: Warren Dolley Apachito Sr. MRN: [id number] DOB: [birth year] Today's Date: 10/12/2021   History of Present Illness Pt adm 10/28 with NSTEMI complicated by cardiogenic shock and acute systolic heart failure. Pt underwent PCI and impella insertion 10/28. Intubated 10/28-11/1. Impella removed 10/31.  PMH -  CAD, prostate CA, HTN, DM, CVA.   Clinical Impression   Pt was independent in self care, driving and walking with a cane prior to admission. He lives with his wife who is in good health. Pt presents with generalized weakness, requiring +2 assist for all mobility with uses of stedy for OOB his first time since admission. Pt requires min to total assist for ADL. He demonstrates decreased attention and slow processing/direction following. VSS throughout session on RA. Pt is likely to progress well with intensive rehab in preparation for return home.      Recommendations for follow up therapy are one component of a multi-disciplinary discharge planning process, led by the attending physician.  Recommendations may be updated based on patient status, additional functional criteria and insurance authorization.   Follow Up Recommendations  Acute inpatient rehab (3hours/day)    Assistance Recommended at Discharge Frequent or constant Supervision/Assistance  Functional Status Assessment  Patient has had a recent decline in their functional status and demonstrates the ability to make significant improvements in function in a reasonable and predictable amount of time.  Equipment Recommendations  [facility]    Recommendations for Other Services       Precautions / Restrictions Precautions Precautions: Fall Restrictions Weight Bearing Restrictions: No      Mobility Bed Mobility Overal bed mobility: Needs Assistance Bed Mobility: Rolling;Supine to Sit Rolling: +2 for physical assistance;Total assist   Supine to sit: +2 for physical assistance;Total  assist;HOB elevated     General bed mobility comments: Assist to bring legs off of bed, elevate trunk into sitting and bring hips to EOB.    Transfers Overall transfer level: Needs assistance Equipment used: Ambulation equipment used Transfers: Sit to/from Stand Sit to Stand: +2 physical assistance;Min assist           General transfer comment: Assist to bring hips up and for balance. Incr time to rise and verbal/tactile cues to extend knees and hips. Transfer via Lift Equipment: Stedy    Balance Overall balance assessment: Needs assistance Sitting-balance support: Bilateral upper extremity supported;Feet supported Sitting balance-Leahy Scale: Poor Sitting balance - Comments: Pt initially mod assist for balance and progressing to min guard with UE support Postural control: Posterior lean Standing balance support: Bilateral upper extremity supported Standing balance-Leahy Scale: Poor Standing balance comment: Stedy and min assist for static standing                           ADL either performed or assessed with clinical judgement   ADL Overall ADL's : Needs assistance/impaired Eating/Feeding: NPO   Grooming: Minimal assistance;Oral care;Sitting   Upper Body Bathing: Maximal assistance;Sitting   Lower Body Bathing: Total assistance;+2 for physical assistance;Sit to/from stand   Upper Body Dressing : Maximal assistance;Sitting   Lower Body Dressing: Total assistance;+2 for physical assistance;Sit to/from stand   Toilet Transfer: Total assistance Toilet Transfer Details (indicate cue type and reason): used stedy to transfer [place] and Hygiene: Total assistance;+2 for physical assistance;Sit to/from stand               Vision Baseline Vision/History: 1 Wears glasses Ability to See in Adequate Light:  0 Adequate Patient Visual Report: No change from baseline Additional Comments: glasses are not at hospital     Perception      Praxis      Pertinent Vitals/Pain Pain Assessment: No/denies pain     Hand Dominance Right   Extremity/Trunk Assessment Upper Extremity Assessment Upper Extremity Assessment: Generalized weakness (3+/5)   Lower Extremity Assessment Lower Extremity Assessment: Defer to PT evaluation RLE Deficits / Details: grossly 3-/5 LLE Deficits / Details: grossly 3-/5   Cervical / Trunk Assessment Cervical / Trunk Assessment: Other exceptions Cervical / Trunk Exceptions: weakness   Communication Communication Communication: No difficulties   Cognition Arousal/Alertness: Awake/alert Behavior During Therapy: Flat affect Overall Cognitive Status: Impaired/Different from baseline Area of Impairment: Following commands;Problem solving;Safety/judgement;Attention                   Current Attention Level: Selective   Following Commands: Follows one step commands with increased time Safety/Judgement: Decreased awareness of safety   Problem Solving: Slow processing;Decreased initiation;Requires verbal cues       General Comments  VSS on RA    Exercises     Shoulder Instructions      Home Living Family/patient expects to be discharged to:: Private residence Living Arrangements: Spouse/significant other Available Help at Discharge: Family;Available PRN/intermittently Type of Home: House Home Access: Stairs to enter [employer] of Steps: 5 Entrance Stairs-Rails: Right;Left Home Layout: Two level;Able to live on main level with bedroom/bathroom Alternate Level Stairs-Number of Steps: flight   Bathroom Shower/Tub: Teacher, early years/pre: Standard     Home Equipment: [employer] - single point   Additional Comments: Wife works during the day, office and his trains are upstairs in home      Prior Functioning/Environment Prior Level of Function : Independent/Modified Independent;Driving;History of Falls (last six months)             Mobility  Comments: Uses cane          OT Problem List: Decreased strength;Decreased activity tolerance;Impaired balance (sitting and/or standing);Decreased cognition;Decreased knowledge of use of DME or AE;Impaired UE functional use;Obesity      OT Treatment/Interventions: Self-care/ADL training;Energy conservation;DME and/or AE instruction;Therapeutic activities;Patient/family education;Balance training;Cognitive remediation/compensation    OT Goals(Current goals can be found in the care plan section) Acute Rehab OT Goals Patient Stated Goal: get stronger and return home OT Goal Formulation: With patient Time For Goal Achievement: 10/26/21 Potential to Achieve Goals: Good ADL Goals Pt Will Perform Eating: Independently;sitting (when cleared for PO) Pt Will Perform Grooming: with min guard assist;standing Pt Will Perform Upper Body Dressing: with supervision;sitting Pt Will Perform Lower Body Dressing: with min assist;sit to/from stand Pt Will Transfer to Toilet: with min assist;ambulating;bedside commode Pt Will Perform Toileting - Clothing Manipulation and hygiene: with min assist;sit to/from stand Pt/caregiver will Perform Home Exercise Program: Increased strength;Both right and left upper extremity;With Supervision (AROM) Additional ADL Goal #1: Pt will perform bed mobility with min assist in preparation for ADL.  OT Frequency: Min 2X/week   Barriers to D/C:            Co-evaluation   Reason for Co-Treatment: Complexity of the patient's impairments (multi-system involvement);For patient/therapist safety PT goals addressed during session: Mobility/safety with mobility;Balance        AM-PAC OT "6 Clicks" Daily Activity     Outcome Measure Help from another person eating meals?: Total Help from another person taking care of personal grooming?: A Little Help from another person toileting, which includes using  toliet, bedpan, or urinal?: Total Help from another person bathing  (including washing, rinsing, drying)?: A Lot Help from another person to put on and taking off regular upper body clothing?: A Lot Help from another person to put on and taking off regular lower body clothing?: Total 6 Click Score: 10   End of Session Equipment Utilized During Treatment: Gait belt Nurse Communication: Mobility status  Activity Tolerance: Patient tolerated treatment well Patient left: in chair;with call bell/phone within reach;with chair alarm set  OT Visit Diagnosis: Unsteadiness on feet (R26.81);Muscle weakness (generalized) (M62.81);Other symptoms and signs involving cognitive function                Time: 7482-7078 OT Time Calculation (min): 27 min Charges:  OT General Charges $OT Visit: 1 Visit OT Evaluation $OT Eval Moderate Complexity: 1 Mod  Nestor Lewandowsky, OTR/L Acute Rehabilitation Services Pager: [phone] Office: [phone]  Malka So 10/12/2021, 12:14 PM

## 2021-10-13 ENCOUNTER — Inpatient Hospital Stay (HOSPITAL_COMMUNITY): Payer: Medicare Other

## 2021-10-13 ENCOUNTER — Encounter (HOSPITAL_COMMUNITY): Payer: Self-pay | Admitting: Internal Medicine

## 2021-10-13 LAB — BASIC METABOLIC PANEL
Anion gap: 7 (ref 5–15)
BUN: 53 mg/dL — ABNORMAL HIGH (ref 8–23)
CO2: 26 mmol/L (ref 22–32)
Calcium: 9.2 mg/dL (ref 8.9–10.3)
Chloride: 110 mmol/L (ref 98–111)
Creatinine, Ser: 1.23 mg/dL (ref 0.61–1.24)
GFR, Estimated: >60 mL/min (ref >60)
Glucose, Bld: 200 mg/dL — ABNORMAL HIGH (ref 70–99)
Potassium: 3.8 mmol/L (ref 3.5–5.1)
Sodium: 143 mmol/L (ref 135–145)

## 2021-10-13 LAB — COOXEMETRY PANEL
Carboxyhemoglobin: 1.2 % (ref 0.5–1.5)
Methemoglobin: 0.9 % (ref 0.0–1.5)
O2 Saturation: 71.7 %
Total hemoglobin: 12.3 g/dL (ref 12.0–16.0)

## 2021-10-13 LAB — GLUCOSE, CAPILLARY
Glucose-Capillary: 187 mg/dL — ABNORMAL HIGH (ref 70–99)
Glucose-Capillary: 200 mg/dL — ABNORMAL HIGH (ref 70–99)
Glucose-Capillary: 209 mg/dL — ABNORMAL HIGH (ref 70–99)
Glucose-Capillary: 227 mg/dL — ABNORMAL HIGH (ref 70–99)
Glucose-Capillary: 228 mg/dL — ABNORMAL HIGH (ref 70–99)

## 2021-10-13 LAB — CBC
HCT: 36.9 % — ABNORMAL LOW (ref 39.0–52.0)
Hemoglobin: 11.9 g/dL — ABNORMAL LOW (ref 13.0–17.0)
MCH: 26.7 pg (ref 26.0–34.0)
MCHC: 32.2 g/dL (ref 30.0–36.0)
MCV: 82.9 fL (ref 80.0–100.0)
Platelets: 140 10*3/uL — ABNORMAL LOW (ref 150–400)
RBC: 4.45 MIL/uL (ref 4.22–5.81)
RDW: 14.6 % (ref 11.5–15.5)
WBC: 8.4 10*3/uL (ref 4.0–10.5)
nRBC: 0 % (ref 0.0–0.2)

## 2021-10-13 LAB — LACTATE DEHYDROGENASE: LDH: 382 U/L — ABNORMAL HIGH (ref 98–192)

## 2021-10-13 LAB — MAGNESIUM: Magnesium: 2.2 mg/dL (ref 1.7–2.4)

## 2021-10-13 MED ORDER — DIGOXIN 125 MCG PO TABS
0.1250 mg | ORAL_TABLET | Freq: Every day | ORAL | Status: DC
  Administered 2021-10-13: 0.125 mg via ORAL
  Filled 2021-10-13: qty 1

## 2021-10-13 MED ORDER — FOOD THICKENER (SIMPLYTHICK)
1.0000 | ORAL | Status: DC | PRN
  Filled 2021-10-13: qty 10

## 2021-10-13 MED ORDER — SPIRONOLACTONE 25 MG PO TABS
25.0000 mg | ORAL_TABLET | Freq: Every day | ORAL | Status: DC
  Administered 2021-10-13 – 2021-10-16 (×4): 25 mg
  Filled 2021-10-13 (×4): qty 1

## 2021-10-13 MED ORDER — MELATONIN 3 MG PO TABS
3.0000 mg | ORAL_TABLET | Freq: Every day | ORAL | Status: DC
  Administered 2021-10-13: 3 mg via ORAL
  Filled 2021-10-13: qty 1

## 2021-10-13 MED ORDER — ENOXAPARIN SODIUM 60 MG/0.6ML IJ SOSY
50.0000 mg | PREFILLED_SYRINGE | INTRAMUSCULAR | Status: DC
  Administered 2021-10-13 – 2021-10-15 (×3): 50 mg via SUBCUTANEOUS
  Filled 2021-10-13: qty 0.6
  Filled 2021-10-13: qty 0.5
  Filled 2021-10-13: qty 0.6
  Filled 2021-10-13: qty 0.5

## 2021-10-13 MED ORDER — DIGOXIN 125 MCG PO TABS
0.1250 mg | ORAL_TABLET | Freq: Every day | ORAL | Status: DC
  Administered 2021-10-14 – 2021-10-16 (×3): 0.125 mg
  Filled 2021-10-13 (×3): qty 1

## 2021-10-13 NOTE — Progress Notes (Addendum)
Patient ID: Warren Stipes Licht Sr., male   DOB: December 21, 1950, 70 y.o.   MRN: [id number]     Advanced Heart Failure Rounding Note  PCP-Cardiologist: None   Subjective:   10/31: Impella pulled 11/01: Extubated  Co-ox 72% on 0.125 milrinone.  CVP  1-2  BP stable.  Scr continues to improve, 1.23 this am.  Neg 5L yestrerday. Down 4 lb.  Hematuria improving after foley replaced + CBI. Is/Os not accurate.  Platelets improving after impella out, 92 > 111 > 140  Failed swallow eval yesterday, has coretrak + tube feeds   Objective:   Weight Range: 106.1 kg Body mass index is 30.86 kg/m.   Vital Signs:   Temp:  [98.1 F (36.7 C)-98.8 F (37.1 C)] 98.1 F (36.7 C) (11/03 0300) Pulse Rate:  [81-115] 81 (11/03 0700) Resp:  [15-23] 18 (11/03 0700) BP: (110-153)/(64-93) 140/65 (11/03 0700) SpO2:  [98 %-100 %] 100 % (11/03 0700) Weight:  [106.1 kg] 106.1 kg (11/03 0338) Last BM Date: 10/13/21  Weight change: Filed Weights   10/11/21 0343 10/12/21 0500 10/13/21 0338  Weight: 112.5 kg 107.6 kg 106.1 kg    Intake/Output:   Intake/Output Summary (Last 24 hours) at 10/13/2021 0748 Last data filed at 10/13/2021 0600 Gross per 24 hour  Intake 308.73 ml  Output 5576 ml  Net -5267.27 ml      Physical Exam   CVP 1-2 General:  Well appearing. No resp difficulty HEENT: +NG Neck: supple. no JVD. Carotids 2+ bilat; no bruits. No lymphadenopathy or thryomegaly appreciated. Cor: PMI nondisplaced. Regular rate & rhythm. No rubs, gallops or murmurs. Lungs: clear Abdomen: soft, nontender, nondistended. No hepatosplenomegaly. No bruits or masses. Good bowel sounds. Extremities: no cyanosis, clubbing, rash, edema, + RUE PICC Neuro: alert & orientedx3, cranial nerves grossly intact. moves all 4 extremities w/o difficulty. Affect pleasant    Telemetry   SR 80s  Labs    CBC Recent Labs    10/12/21 0308 10/13/21 0301  WBC 8.3 8.4  HGB 11.2* 11.9*  HCT 34.4* 36.9*  MCV 82.7  82.9  PLT 111* 643*   Basic Metabolic Panel Recent Labs    10/11/21 0415 10/12/21 0308 10/13/21 0301  NA 140 139 143  K 3.9 3.7 3.8  CL 104 102 110  CO2 _0 GLUCOSE 187* 164* 200*  BUN 63* 62* 53*  CREATININE 1.88* 1.61* 1.23  CALCIUM 8.8* 8.9 9.2  MG 2.0 2.2 2.2  PHOS 3.4  --   --    Liver Function Tests No results for input(s): AST, ALT, ALKPHOS, BILITOT, PROT, ALBUMIN in the last 72 hours. No results for input(s): LIPASE, AMYLASE in the last 72 hours. Cardiac Enzymes No results for input(s): CKTOTAL, CKMB, CKMBINDEX, TROPONINI in the last 72 hours.  BNP: BNP (last 3 results) Recent Labs    09/18/2021 0659  BNP 344.3*    ProBNP (last 3 results) No results for input(s): PROBNP in the last 8760 hours.   D-Dimer No results for input(s): DDIMER in the last 72 hours. Hemoglobin A1C No results for input(s): HGBA1C in the last 72 hours.  Fasting Lipid Panel No results for input(s): CHOL, HDL, LDLCALC, TRIG, CHOLHDL, LDLDIRECT in the last 72 hours. Thyroid Function Tests No results for input(s): TSH, T4TOTAL, T3FREE, THYROIDAB in the last 72 hours.  Invalid input(s): FREET3  Other results:   Imaging    DG Abd Portable 1V  Result Date: 10/12/2021 CLINICAL DATA:  Tube placement EXAM: PORTABLE ABDOMEN -  1 VIEW COMPARISON:  Abdominal x-ray 10/11/2021 FINDINGS: Enteric tube tip is in the stomach slightly right of midline. No gaseous distended loops of bowel identified. Surgical clips in the right upper quadrant. IMPRESSION: Enteric tube tip in the stomach. Electronically Signed   By: Ofilia Neas M.D.   On: 10/12/2021 10:29     Medications:     Scheduled Medications:  amiodarone  200 mg Per Tube BID   aspirin  81 mg Per Tube Daily   atorvastatin  80 mg Per Tube Daily   chlorhexidine gluconate (MEDLINE KIT)  15 mL Mouth Rinse BID   Chlorhexidine Gluconate Cloth  6 each Topical Daily   docusate  100 mg Per Tube BID   enoxaparin (LOVENOX) injection   40 mg Subcutaneous Q24H   feeding supplement (PROSource TF)  45 mL Per Tube TID   insulin aspart  0-15 Units Subcutaneous Q4H   insulin glargine-yfgn  8 Units Subcutaneous QHS   mouth rinse  15 mL Mouth Rinse BID   polyethylene glycol  17 g Per Tube Daily   potassium chloride  40 mEq Per Tube BID   sacubitril-valsartan  1 tablet Oral BID   sodium chloride flush  10-40 mL Intracatheter Q12H   sodium chloride flush  10-40 mL Intracatheter Q12H   sodium chloride flush  3 mL Intravenous Q12H   spironolactone  12.5 mg Per Tube Daily   ticagrelor  90 mg Per Tube BID    Infusions:  sodium chloride 5 mL/hr at 10/13/21 0600   cefTRIAXone (ROCEPHIN)  IV Stopped (10/12/21 1254)   feeding supplement (VITAL 1.5 CAL) 1,000 mL (10/12/21 1633)   milrinone 0.125 mcg/kg/min (10/13/21 0600)   sodium chloride irrigation      PRN Medications: sodium chloride, acetaminophen (TYLENOL) oral liquid 160 mg/5 mL, hydrALAZINE, sodium chloride flush, sodium chloride flush, sodium chloride flush   Assessment/Plan   1. CAD/NSTEMI:  Prior inferior MI and placement of 2 stents to RCA in 1996.  Admitted with NSTEMI, found to have 75% distal LM stenosis with occluded proximal LAD and ostial LCx.  S/p PCI distal LM through mid LAD, ostial LCx remains occluded. No s/s angina.  - ASA + Brilinta.  - Atorvastatin.  2. Acute systolic CHF -> cardiogenic shock: Ischemic cardiomyopathy.  EF 20-25% on echo. RV normal.  Impella placed in cath lab with cardiogenic shock.  Impella pulled 10/31 - co-ox 72% on 0.125 milrinone. Will stop milrinone. - CVP 1-2. No diuretics today. Is/Os not accurate.  - Continue entresto 24/26 mg BID - Increase spiro to 25 mg daily - Add digoxin 0.125 mg daily - No beta blocker d/t recent shock - SGLT2i prior to discharge 3. NSVT/PVCs: Frequent initially now has settled down on amiodarone gtt.    - Now on po amio 4. Acute hypoxemic respiratory failure with pulmonary edema and HCAP: Intubated  10/27. CXR 10/30 with bibasilar infiltrates.  Also with low grade fever to 100.2 and thick secretions. AF today - Procalcitonin 0.18, blood cultures NGTD. GPC and GNR on sputum, (rare group B strep) - Now on ceftriaxone - Extubated 11/01. 02 sats stable on RA 5. Type 2 diabetes: SSI.  - SGLT2i prior to d/c 6. AKI: In setting of cardiogenic shock.   Scr 2.4 > 2.2 -> 1.88 ->1.6 -> 1.2 Baseline 1.1 7. Anemia, acute blood loss - Hgb improving with improving foley and CBI. 12 today. 8. Thrombocytopenia: -Plt 88-> 92->111K->140. Improving after Impella out  Transfer out of ICU today  Will  likely need CIR or SNF for rehab once stable enough for discharge. CIR consulted.  Length of Stay: 6  [facility], [place], PA-C  10/13/2021, 7:48 AM  Advanced Heart Failure Team Pager [phone] (M-F; 7a - 5p)  Please contact [facility] for night-coverage after hours (5p -7a ) and weekends on [email or web address]  Patient seen and examined with the above-signed Advanced Practice Provider and/or Housestaff. I personally reviewed laboratory data, imaging studies and relevant notes. I independently examined the patient and formulated the important aspects of the plan. I have edited the note to reflect any of my changes or salient points. I have personally discussed the plan with the patient and/or family.  Co-ox ok on low-dose milrinone. Volume status ok. Denies CP or SOB. Very weak. Having trouble sleeping. Says he feels poorly. Now with 3-way Foley for CBI due to hematuria. PLTs improving. Cor-trak in   General:  Sitting in bed weak-appearing. No resp difficulty HEENT: normal + cor-trak Neck: supple. no JVD. Carotids 2+ bilat; no bruits. No lymphadenopathy or thryomegaly appreciated. Cor: PMI nondisplaced. Regular rate & rhythm. No rubs, gallops or murmurs. Lungs: decreased at bases Abdomen: soft, nontender, nondistended. No hepatosplenomegaly. No bruits or masses. Good bowel sounds. Extremities: no cyanosis,  clubbing, rash, tr edema  + 3-way foley  Neuro: alert & orientedx3, cranial nerves grossly intact. moves all 4 extremities w/o difficulty. Affect pleasant  Improving from cardiac standpoint. Can stop milrinone. CVP low. No need for diuretics. Continue to titrate GDMT.Continue DAPT/statin.   Have d/w [facility] will transfer out of ICU to their service to help manage complex medical issues.   D/w CIR rehab coordinator who is screening patient today.   Glori Bickers, MD  10:24 AM

## 2021-10-13 NOTE — Progress Notes (Signed)
Inpatient Rehabilitation Admissions Coordinator   Acute inpatient rehab consult received. I met at bedside with patient for assessment and then spoke with his wife by phone. With wife I discussed goals and expectations of a possible [facility] admit. She works 2 jobs and will need to clarify caregiver supports when she works. She will discuss with patient and family and follow up with me.   Danne Baxter, RN, MSN Rehab Admissions Coordinator [phone] 10/13/2021 12:43 PM

## 2021-10-13 NOTE — Progress Notes (Signed)
Modified Barium Swallow Progress Note  Patient Details  Name: Warren Bartholomew Perdomo Sr. MRN: [id number] Date of Birth: 03-Apr-1951  Today's Date: 10/13/2021  Modified Barium Swallow completed.  Full report located under Chart Review in the Imaging Section.  Brief recommendations include the following:  Clinical Impression  Pt presents with pharyngeal dysphagia characterized by reduced base of tongue retraction, a pharyngeal delay, reduced hyolaryngeal elevation, and reduced anterior laryngeal movement. He demonstrated incomplete epiglottic inversion, vallecular residue, and penetration (PAS 3, 5) with thin liquids via cup and straw. Prompted coughing was effective in expelling penetrated material. Aspiration was not definitively seen while the fluoro was on, but coughing was noted between swallows after an episode of penetration (PAS 5) and aspiration of penetrate is suspected. A chin tuck posture with slight left head turn improved penetration to PAS 2,3 but did not eliminate it. Amount of vallecular residue increased with bolus size and with advancement of bolus consistency. Residue was eliminated with multiple liquid washes, but thin liquids was more effective. The barium tablet (given with nectar thick liquids) became lodged in the valleculae, and it was resistant to movement with additional boluses of nectar thick liquids. Transport was facilitated with two boluses of puree, but movement was then halted again at the level of pyriform sinuses. Multiple additional boluses of nectar thick liquids were necessary to transport the bolus the the cervical esophagus. Esophageal stasis was noted throughout the middle and lower thoracic esophagus with retrograde flow to the upper thoracic esophagus; esophageal assessment would be necessary to determine the etiology of this. A dysphagia 3 diet with nectar thick liquids will be initiated at this time with observance of swallowing precautions. SLP will follow to  assess tolerance and for diet advancement as clinically indicated.   Swallow Evaluation Recommendations       SLP Diet Recommendations: Dysphagia 3 (Mech soft) solids;Nectar thick liquid   Liquid Administration via: Cup;Straw   Medication Administration: Crushed with puree   Supervision: Staff to assist with self feeding   Compensations: Slow rate;Small sips/bites;Follow solids with liquid;Effortful swallow   Postural Changes: Seated upright at 90 degrees       Other Recommendations: Order thickener from pharmacy  Warren Stone, [place], [place] Office number [phone] Pager [place] 10/13/2021,2:08 PM

## 2021-10-13 NOTE — Progress Notes (Signed)
Physical Therapy Treatment Patient Details Name: Warren Stone Sr. MRN: [id number] DOB: Feb 13, 1951 Today's Date: 10/13/2021   History of Present Illness Pt adm 10/28 with NSTEMI complicated by cardiogenic shock and acute systolic heart failure. Pt underwent PCI and impella insertion 10/28. Intubated 10/28-11/1. Impella removed 10/31.  PMH -  CAD, prostate CA, HTN, DM, CVA.    PT Comments    Pt making steady progress with mobility. Able to amb short distance in room today. Continue to recommend inpatient rehab for further rehab.   Recommendations for follow up therapy are one component of a multi-disciplinary discharge planning process, led by the attending physician.  Recommendations may be updated based on patient status, additional functional criteria and insurance authorization.  Follow Up Recommendations  Acute inpatient rehab (3hours/day)     Assistance Recommended at Discharge Frequent or constant Supervision/Assistance  Equipment Recommendations  Rolling walker (2 wheels)    Recommendations for Other Services       Precautions / Restrictions Precautions Precautions: Fall     Mobility  Bed Mobility Overal bed mobility: Needs Assistance Bed Mobility: Supine to Sit;Sit to Sidelying     Supine to sit: +2 for physical assistance;Max assist;HOB elevated   Sit to sidelying: +2 for physical assistance;Mod assist General bed mobility comments: Assist to bring legs off of bed, elevate trunk into sitting and bring hips to EOB. Assist to lower trunk and bring legs back up into bed.    Transfers Overall transfer level: Needs assistance Equipment used: Rolling walker (2 wheels) Transfers: Sit to/from [facility] Sit to Stand: +2 physical assistance;Mod assist Stand pivot transfers: +2 physical assistance;Mod assist         General transfer comment: Assist to bring hips up and for balance. Incr time to rise and verbal/tactile cues to extend knees and  hips. Bed to [facility] with small pivotal steps with pt wanting to sit prior to turning hips all the way to [facility].    Ambulation/Gait Ambulation/Gait assistance: Min assist;+2 safety/equipment Gait Distance (Feet): 6 Feet Assistive device: Rolling walker (2 wheels) Gait Pattern/deviations: Step-through pattern;Decreased step length - left;Decreased stance time - right;Shuffle Gait velocity: decr Gait velocity interpretation: <1.31 ft/sec, indicative of household ambulator General Gait Details: Assist for balance and support   Chief Strategy Officer    Modified Rankin (Stroke Patients Only)       Balance Overall balance assessment: Needs assistance Sitting-balance support: Bilateral upper extremity supported;Feet supported Sitting balance-Leahy Scale: Poor Sitting balance - Comments: Initial min assist for balance and then min guard with UE suppot. Postural control: Posterior lean Standing balance support: Bilateral upper extremity supported Standing balance-Leahy Scale: Poor Standing balance comment: walker and min assist for static standing                            Cognition Arousal/Alertness: Awake/alert Behavior During Therapy: WFL for tasks assessed/performed Overall Cognitive Status: Impaired/Different from baseline Area of Impairment: Following commands;Problem solving;Safety/judgement                       Following Commands: Follows one step commands with increased time Safety/Judgement: Decreased awareness of safety   Problem Solving: Slow processing;Decreased initiation;Requires verbal cues          Exercises      General Comments General comments (skin integrity, edema, etc.): VSS on RA  Pertinent Vitals/Pain Pain Assessment: No/denies pain    Home Living                          Prior Function            PT Goals (current goals can now be found in the care plan section) Acute Rehab PT  Goals Patient Stated Goal: return home Progress towards PT goals: Progressing toward goals    Frequency    Min 3X/week      PT Plan Current plan remains appropriate    Co-evaluation              AM-PAC PT "6 Clicks" Mobility   Outcome Measure  Help needed turning from your back to your side while in a flat bed without using bedrails?: Total Help needed moving from lying on your back to sitting on the side of a flat bed without using bedrails?: Total Help needed moving to and from a bed to a chair (including a wheelchair)?: Total Help needed standing up from a chair using your arms (e.g., wheelchair or bedside chair)?: Total Help needed to walk in hospital room?: Total Help needed climbing 3-5 steps with a railing? : Total 6 Click Score: 6    End of Session Equipment Utilized During Treatment: Gait belt Activity Tolerance: Patient tolerated treatment well Patient left: in bed;with call bell/phone within reach;with bed alarm set (pt going for MBS soon) Nurse Communication: Mobility status PT Visit Diagnosis: Other abnormalities of gait and mobility (R26.89);Muscle weakness (generalized) (M62.81)     Time: 5038-8828 PT Time Calculation (min) (ACUTE ONLY): 32 min  Charges:  $Gait Training: 8-22 mins $Therapeutic Activity: 8-22 mins                     [place] Pager [phone] Office [place] 10/13/2021, 1:50 PM

## 2021-10-13 NOTE — Progress Notes (Signed)
Speech Language Pathology Treatment: Dysphagia  Patient Details Name: Warren Stone. MRN: [id number] DOB: January 19, 1951 Today's Date: 10/13/2021 Time: 2241-1464 SLP Time Calculation (min) (ACUTE ONLY): 13 min  Assessment / Plan / Recommendation Clinical Impression  Pt demonstrates ongoing throat clearing and coughing with thin liquids. Will proceed with MBS today to determine ability to initiate diet safely. MBS around 1pm.   HPI HPI: 70 year old man who remains critically ill due to acute hypoxic and hypercarbic respiratory failure require mechanical ventilation as well as acute decompensated systolic heart failure requiring inotropic and vasopressor support. Intubated 10/28-11/1. NSTEMI      SLP Plan  MBS      Recommendations for follow up therapy are one component of a multi-disciplinary discharge planning process, led by the attending physician.  Recommendations may be updated based on patient status, additional functional criteria and insurance authorization.    Recommendations                   Oral Care Recommendations: Oral care BID Plan: MBS       GO                Israel Werts, Katherene Ponto  10/13/2021, 11:44 AM

## 2021-10-13 NOTE — Progress Notes (Signed)
Inpatient Diabetes Program Recommendations  AACE/ADA: New Consensus Statement on Inpatient Glycemic Control (2015)  Target Ranges:  Prepandial:   less than 140 mg/dL      Peak postprandial:   less than 180 mg/dL (1-2 hours)      Critically ill patients:  140 - 180 mg/dL   Lab Results  Component Value Date   GLUCAP 227 (H) 10/13/2021   HGBA1C 6.1 (H) 09/28/2021    Review of Glycemic Control  Diabetes history:  DM 2 Outpatient Diabetes medications:  Glucotrol 10 mg bid Current orders for Inpatient glycemic control:  Novolog moderate q 4 hours Semglee 8 units q HS Vital 1.5 at 60 ml/hr Pro-Source TF 45 mL TID  Inpatient Diabetes Program Recommendations:   -Add Novolog 2 units q 4 hrs tube feed coverage (hold if tube feeds held or stopped)  Thank you, Warren Roys E. Sherma Vanmetre, RN, MSN, CDE  Diabetes Coordinator Inpatient Glycemic Control Team Team Pager [phone] (8am-5pm) 10/13/2021 12:43 PM

## 2021-10-14 DIAGNOSIS — I5021 Acute systolic (congestive) heart failure: Secondary | ICD-10-CM

## 2021-10-14 DIAGNOSIS — Z4659 Encounter for fitting and adjustment of other gastrointestinal appliance and device: Secondary | ICD-10-CM

## 2021-10-14 DIAGNOSIS — J9602 Acute respiratory failure with hypercapnia: Secondary | ICD-10-CM

## 2021-10-14 DIAGNOSIS — J9601 Acute respiratory failure with hypoxia: Secondary | ICD-10-CM

## 2021-10-14 DIAGNOSIS — J189 Pneumonia, unspecified organism: Secondary | ICD-10-CM

## 2021-10-14 DIAGNOSIS — I5082 Biventricular heart failure: Secondary | ICD-10-CM

## 2021-10-14 DIAGNOSIS — I4729 Other ventricular tachycardia: Secondary | ICD-10-CM

## 2021-10-14 LAB — COOXEMETRY PANEL
Carboxyhemoglobin: 1.1 % (ref 0.5–1.5)
Methemoglobin: 0.9 % (ref 0.0–1.5)
O2 Saturation: 62.7 %
Total hemoglobin: 12.4 g/dL (ref 12.0–16.0)

## 2021-10-14 LAB — BASIC METABOLIC PANEL
Anion gap: 6 (ref 5–15)
BUN: 47 mg/dL — ABNORMAL HIGH (ref 8–23)
CO2: 27 mmol/L (ref 22–32)
Calcium: 9.8 mg/dL (ref 8.9–10.3)
Chloride: 111 mmol/L (ref 98–111)
Creatinine, Ser: 1.19 mg/dL (ref 0.61–1.24)
GFR, Estimated: >60 mL/min (ref >60)
Glucose, Bld: 228 mg/dL — ABNORMAL HIGH (ref 70–99)
Potassium: 4.3 mmol/L (ref 3.5–5.1)
Sodium: 144 mmol/L (ref 135–145)

## 2021-10-14 LAB — CULTURE, BLOOD (ROUTINE X 2)
Culture: NO GROWTH
Culture: NO GROWTH
Special Requests: ADEQUATE
Special Requests: ADEQUATE

## 2021-10-14 LAB — CBC
HCT: 36.9 % — ABNORMAL LOW (ref 39.0–52.0)
Hemoglobin: 11.6 g/dL — ABNORMAL LOW (ref 13.0–17.0)
MCH: 26.5 pg (ref 26.0–34.0)
MCHC: 31.4 g/dL (ref 30.0–36.0)
MCV: 84.2 fL (ref 80.0–100.0)
Platelets: 160 10*3/uL (ref 150–400)
RBC: 4.38 MIL/uL (ref 4.22–5.81)
RDW: 15 % (ref 11.5–15.5)
WBC: 9.7 10*3/uL (ref 4.0–10.5)
nRBC: 0 % (ref 0.0–0.2)

## 2021-10-14 LAB — GLUCOSE, CAPILLARY
Glucose-Capillary: 228 mg/dL — ABNORMAL HIGH (ref 70–99)
Glucose-Capillary: 209 mg/dL — ABNORMAL HIGH (ref 70–99)
Glucose-Capillary: 206 mg/dL — ABNORMAL HIGH (ref 70–99)
Glucose-Capillary: 225 mg/dL — ABNORMAL HIGH (ref 70–99)
Glucose-Capillary: 180 mg/dL — ABNORMAL HIGH (ref 70–99)
Glucose-Capillary: 215 mg/dL — ABNORMAL HIGH (ref 70–99)
Glucose-Capillary: 190 mg/dL — ABNORMAL HIGH (ref 70–99)

## 2021-10-14 LAB — MAGNESIUM: Magnesium: 2.1 mg/dL (ref 1.7–2.4)

## 2021-10-14 MED ORDER — TAMSULOSIN HCL 0.4 MG PO CAPS
0.4000 mg | ORAL_CAPSULE | Freq: Every day | ORAL | Status: DC
  Administered 2021-10-14 – 2021-10-16 (×3): 0.4 mg via ORAL
  Filled 2021-10-14 (×3): qty 1

## 2021-10-14 MED ORDER — MELATONIN 3 MG PO TABS
3.0000 mg | ORAL_TABLET | Freq: Every day | ORAL | Status: DC
  Administered 2021-10-14 – 2021-10-15 (×2): 3 mg
  Filled 2021-10-14 (×2): qty 1

## 2021-10-14 MED ORDER — INSULIN GLARGINE-YFGN 100 UNIT/ML ~~LOC~~ SOLN
6.0000 [IU] | Freq: Two times a day (BID) | SUBCUTANEOUS | Status: DC
  Administered 2021-10-14 (×2): 6 [IU] via SUBCUTANEOUS
  Filled 2021-10-14 (×5): qty 0.06

## 2021-10-14 MED ORDER — SACUBITRIL-VALSARTAN 24-26 MG PO TABS
1.0000 | ORAL_TABLET | Freq: Two times a day (BID) | ORAL | Status: DC
Start: 2021-10-14 — End: 2021-10-16
  Administered 2021-10-14 – 2021-10-16 (×4): 1
  Filled 2021-10-14 (×5): qty 1

## 2021-10-14 NOTE — Progress Notes (Signed)
Occupational Therapy Treatment Patient Details Name: Warren Luckenbach Anguiano Sr. MRN: [id number] DOB: 1951/02/04 Today's Date: 10/14/2021   History of present illness Pt adm 10/28 with NSTEMI complicated by cardiogenic shock and acute systolic heart failure. Pt underwent PCI and impella insertion 10/28. Intubated 10/28-11/1. Impella removed 10/31.  PMH -  CAD, prostate CA, HTN, DM, CVA.   OT comments  Pt readily willing to work with therapies on OOB activity. Continues to require +2 assist for bed level mobility and sit to stand, but ambulated with mod assist and close chair follow with encouragement to push himself. Pt now able to self feed with tray set up. VSS. Updated d/c recommendation to SNF as CIR is not an option per chart.    Recommendations for follow up therapy are one component of a multi-disciplinary discharge planning process, led by the attending physician.  Recommendations may be updated based on patient status, additional functional criteria and insurance authorization.    Follow Up Recommendations  Skilled nursing-short term rehab (<3 hours/day)    Assistance Recommended at Discharge Frequent or constant Supervision/Assistance  Equipment Recommendations  [facility]    Recommendations for Other Services      Precautions / Restrictions Precautions Precautions: Fall Restrictions Weight Bearing Restrictions: Yes RLE Weight Bearing: Non weight bearing       Mobility Bed Mobility Overal bed mobility: Needs Assistance Bed Mobility: Rolling;Sidelying to Sit Rolling: Mod assist Sidelying to sit: +2 for physical assistance;Mod assist       General bed mobility comments: Assist to bring shoulders and hips over. Assist to bring legs off of bed, elevate trunk into sitting and bring hips to EOB.    Transfers Overall transfer level: Needs assistance Equipment used: Rolling walker (2 wheels) Transfers: Sit to/from Stand Sit to Stand: +2 physical assistance;Min assist            General transfer comment: Assist to bring hips up. Verbal cues for hand placement. Incr time to rise     Balance Overall balance assessment: Needs assistance Sitting-balance support: Feet supported;No upper extremity supported Sitting balance-Leahy Scale: Fair     Standing balance support: Bilateral upper extremity supported Standing balance-Leahy Scale: Poor Standing balance comment: walker and min assist for static standing                           ADL either performed or assessed with clinical judgement   ADL Overall ADL's : Needs assistance/impaired Eating/Feeding: Minimal assistance;Sitting Eating/Feeding Details (indicate cue type and reason): extremely slow movements for hand to mouth     Upper Body Bathing: Maximal assistance;Sitting Upper Body Bathing Details (indicate cue type and reason): washed and lotion applied to back         Lower Body Dressing: Total assistance;Bed level Lower Body Dressing Details (indicate cue type and reason): for socks             Functional mobility during ADLs: Moderate assistance;+2 for safety/equipment;Rolling walker (2 wheels)       Vision       Perception     Praxis      Cognition Arousal/Alertness: Awake/alert Behavior During Therapy: WFL for tasks assessed/performed Overall Cognitive Status: Impaired/Different from baseline Area of Impairment: Following commands;Problem solving;Attention;Safety/judgement                   Current Attention Level: Selective   Following Commands: Follows one step commands with increased time Safety/Judgement: Decreased awareness of safety  Problem Solving: Slow processing;Decreased initiation;Requires verbal cues General Comments: Pt asking if he was in the cath lab initially, eventually stating he was in "[facility]"          Exercises     Shoulder Instructions       General Comments VSS on RA    Pertinent Vitals/ Pain        Pain Assessment: No/denies pain  Home Living                                          Prior Functioning/Environment              Frequency  Min 2X/week        Progress Toward Goals  OT Goals(current goals can now be found in the care plan section)  Progress towards OT goals: Progressing toward goals  Acute Rehab OT Goals OT Goal Formulation: With patient Time For Goal Achievement: 10/26/21 Potential to Achieve Goals: Good  Plan Discharge plan needs to be updated    Co-evaluation    PT/OT/SLP Co-Evaluation/Treatment: Yes Reason for Co-Treatment: For patient/therapist safety PT goals addressed during session: Mobility/safety with mobility OT goals addressed during session: Strengthening/ROM      AM-PAC OT "6 Clicks" Daily Activity     Outcome Measure   Help from another person eating meals?: A Little Help from another person taking care of personal grooming?: A Little Help from another person toileting, which includes using toliet, bedpan, or urinal?: Total Help from another person bathing (including washing, rinsing, drying)?: A Lot Help from another person to put on and taking off regular upper body clothing?: A Lot Help from another person to put on and taking off regular lower body clothing?: Total 6 Click Score: 12    End of Session Equipment Utilized During Treatment: Gait belt;Rolling walker (2 wheels)  OT Visit Diagnosis: Unsteadiness on feet (R26.81);Muscle weakness (generalized) (M62.81);Other symptoms and signs involving cognitive function   Activity Tolerance Patient tolerated treatment well   Patient Left in chair;with call bell/phone within reach;with chair alarm set;with nursing/sitter in room   Nurse Communication Mobility status;Other (comment) (back is itchy with red bumps)        Time: 0388-8280 OT Time Calculation (min): 27 min  Charges: OT General Charges $OT Visit: 1 Visit OT Treatments $Therapeutic Activity:  8-22 mins  Nestor Lewandowsky, OTR/L Acute Rehabilitation Services Pager: [phone] Office: [phone]   Malka So 10/14/2021, 12:58 PM

## 2021-10-14 NOTE — TOC Progression Note (Addendum)
Transition of Care (TOC) - Progression Note    Patient Details  Name: Kunal Levario Sanna Sr. MRN: [id number] Date of Birth: 1951/01/10  Transition of Care [facility]) CM/SW Contact  Mechelle Pates, [place] Phone Number: 10/14/2021, 3:49 PM  Clinical Narrative:    HF CSW filled out FL2 it is ready for MD signature once Mr. Moronta no longer has the cortrak. HF RNCM sent referral to LTAC.  CSW will continue to follow throughout discharge.   Expected Discharge Plan: IP Rehab Facility Barriers to Discharge: Continued Medical Work up  Expected Discharge Plan and Services Expected Discharge Plan: [place] In-house Referral: Clinical Social Work Discharge Planning Services: CM Consult Post Acute Care Choice: IP Rehab Living arrangements for the past 2 months: Single Family Home                                       Social Determinants of Health (SDOH) Interventions    Readmission Risk Interventions No flowsheet data found.  Auriana Scalia, MSW, [place] Heart Failure Social Worker

## 2021-10-14 NOTE — Progress Notes (Signed)
Inpatient Rehabilitation Admissions Coordinator   Notified by Dr Aileen Fass that acute to pursue LTACH vs SNF. We will sign off at this time.  Danne Baxter, RN, MSN Rehab Admissions Coordinator [phone] 10/14/2021 8:39 AM

## 2021-10-14 NOTE — Progress Notes (Signed)
Physical Therapy Treatment Patient Details Name: Warren Colavito Waidelich Sr. MRN: [id number] DOB: [birth year] Today's Date: 10/14/2021   History of Present Illness Pt adm 10/28 with NSTEMI complicated by cardiogenic shock and acute systolic heart failure. Pt underwent PCI and impella insertion 10/28. Intubated 10/28-11/1. Impella removed 10/31.  PMH -  CAD, prostate CA, HTN, DM, CVA.    PT Comments    Pt making steady progress. Pt incr ambulation daily. Inpatient rehab not an option at this point. Updated dc to SNF.    Recommendations for follow up therapy are one component of a multi-disciplinary discharge planning process, led by the attending physician.  Recommendations may be updated based on patient status, additional functional criteria and insurance authorization.  Follow Up Recommendations  Skilled nursing-short term rehab (<3 hours/day)     Assistance Recommended at Discharge Frequent or constant Supervision/Assistance  Equipment Recommendations  Rolling walker (2 wheels)    Recommendations for Other Services       Precautions / Restrictions Precautions Precautions: Fall Restrictions Weight Bearing Restrictions: Yes RLE Weight Bearing: Non weight bearing     Mobility  Bed Mobility Overal bed mobility: Needs Assistance Bed Mobility: Sidelying to Sit;Rolling Rolling: Mod assist Sidelying to sit: +2 for physical assistance;Mod assist       General bed mobility comments: Assist to bring shoulders and hips over. Assist to bring legs off of bed, elevate trunk into sitting and bring hips to EOB.    Transfers Overall transfer level: Needs assistance Equipment used: Rolling walker (2 wheels) Transfers: Sit to/from [employer] Transfers Sit to Stand: +2 physical assistance;Min assist           General transfer comment: Assist to bring hips up. Verbal cues for hand placement. Incr time to rise    Ambulation/Gait Ambulation/Gait assistance: Min assist;+2  safety/equipment;Mod assist Gait Distance (Feet): 12 Feet Assistive device: Rolling walker (2 wheels) Gait Pattern/deviations: Step-through pattern;Decreased step length - left;Decreased stance time - right;Shuffle;Trunk flexed Gait velocity: decr Gait velocity interpretation: <1.31 ft/sec, indicative of household ambulator General Gait Details: Assist for balance and support as well as to guide and move walker. Needed incr assist as distance increased. Verbal cues to keeping stepping and to look up.   Stairs             Wheelchair Mobility    Modified Rankin (Stroke Patients Only)       Balance Overall balance assessment: Needs assistance Sitting-balance support: Feet supported;No upper extremity supported Sitting balance-Leahy Scale: Fair     Standing balance support: Bilateral upper extremity supported Standing balance-Leahy Scale: Poor Standing balance comment: walker and min assist for static standing                            Cognition Arousal/Alertness: Awake/alert Behavior During Therapy: WFL for tasks assessed/performed Overall Cognitive Status: Impaired/Different from baseline Area of Impairment: Following commands;Problem solving                   Current Attention Level: Selective   Following Commands: Follows one step commands with increased time     Problem Solving: Slow processing;Decreased initiation;Requires verbal cues          Exercises      General Comments General comments (skin integrity, edema, etc.): VSS on RA      Pertinent Vitals/Pain Pain Assessment: No/denies pain    Home Living  Prior Function            PT Goals (current goals can now be found in the care plan section) Acute Rehab PT Goals Patient Stated Goal: return home Progress towards PT goals: Progressing toward goals    Frequency    Min 3X/week      PT Plan Discharge plan needs to be updated     Co-evaluation PT/OT/SLP Co-Evaluation/Treatment: Yes Reason for Co-Treatment: For patient/therapist safety PT goals addressed during session: Mobility/safety with mobility        AM-PAC PT "6 Clicks" Mobility   Outcome Measure  Help needed turning from your back to your side while in a flat bed without using bedrails?: Total Help needed moving from lying on your back to sitting on the side of a flat bed without using bedrails?: Total Help needed moving to and from a bed to a chair (including a wheelchair)?: Total Help needed standing up from a chair using your arms (e.g., wheelchair or bedside chair)?: Total Help needed to walk in hospital room?: Total Help needed climbing 3-5 steps with a railing? : Total 6 Click Score: 6    End of Session Equipment Utilized During Treatment: Gait belt Activity Tolerance: Patient tolerated treatment well Patient left: with call bell/phone within reach;in chair;with chair alarm set Nurse Communication: Mobility status PT Visit Diagnosis: Other abnormalities of gait and mobility (R26.89);Muscle weakness (generalized) (M62.81)     Time: 0923-3007 PT Time Calculation (min) (ACUTE ONLY): 25 min  Charges:  $Gait Training: 8-22 mins                     [place] Pager [phone] Office [place] 10/14/2021, 12:41 PM

## 2021-10-14 NOTE — TOC CM/SW Note (Addendum)
509 pm HF TOC CM spoke to wife and offered choice for LTAC. She would like to review. Both facilities offered a bed based on if pt remains with Cortrak. Attending updated.   Jonnie Finner RN3 CCM, Heart Failure TOC CM [phone]   337 pm HF TOC CM referral to [facility], rep Raquel Sarna for review. Referral sent to [facility] rep, Anderson Malta for review.  [place], Heart Failure TOC CM [phone]

## 2021-10-14 NOTE — Progress Notes (Addendum)
Patient ID: Leotis Isham Epping Sr., male   DOB: 04-23-1951, 70 y.o.   MRN: [id number]     Advanced Heart Failure Rounding Note  PCP-Cardiologist: None   Subjective:   10/31: Impella pulled 11/01: Extubated  Co-ox 63% off milrinone  CVP  3-4  Scr stable at 1.19. -1L yesterday. Weights don't appear accurate.  BP stable, actually elevated this am.  Hematuria resolved  Platelets improving after impella out, 92 > 111 > 140 > 160  On dysphagia 3 diet. Coughing frequently.  Very weak.    Objective:   Weight Range: 108.7 kg Body mass index is 31.62 kg/m.   Vital Signs:   Temp:  [97.8 F (36.6 C)-98.7 F (37.1 C)] 98 F (36.7 C) (11/04 0359) Pulse Rate:  [68-83] 78 (11/04 0600) Resp:  [14-24] 23 (11/04 0600) BP: (119-148)/(56-77) 140/66 (11/04 0600) SpO2:  [99 %-100 %] 100 % (11/04 0600) Weight:  [108.7 kg] 108.7 kg (11/04 0600) Last BM Date: 10/13/21  Weight change: Filed Weights   10/12/21 0500 10/13/21 0338 10/14/21 0600  Weight: 107.6 kg 106.1 kg 108.7 kg    Intake/Output:   Intake/Output Summary (Last 24 hours) at 10/14/2021 0801 Last data filed at 10/14/2021 0600 Gross per 24 hour  Intake 4614.54 ml  Output 3451 ml  Net 1163.54 ml      Physical Exam   CVP 3-4 General:  Well appearing. No resp difficulty HEENT: +NG Neck: supple. no JVD. Carotids 2+ bilat; no bruits.  Cor: PMI nondisplaced. Regular rate & rhythm. No rubs, gallops or murmurs. Lungs: clear Abdomen: soft, nontender, nondistended. No hepatosplenomegaly. No bruits or masses. Good bowel sounds. Extremities: no cyanosis, clubbing, rash, edema, + RUE PICC Neuro: alert & orientedx3, cranial nerves grossly intact. moves all 4 extremities w/o difficulty. Affect pleasant    Telemetry   SR 70s-80s (personally reviewed)  Labs    CBC Recent Labs    10/13/21 0301 10/14/21 0445  WBC 8.4 9.7  HGB 11.9* 11.6*  HCT 36.9* 36.9*  MCV 82.9 84.2  PLT 140* 056   Basic Metabolic  Panel Recent Labs    10/13/21 0301 10/14/21 0445  NA 143 144  K 3.8 4.3  CL 110 111  CO2 26 27  GLUCOSE 200* 228*  BUN 53* 47*  CREATININE 1.23 1.19  CALCIUM 9.2 9.8  MG 2.2 2.1   Liver Function Tests No results for input(s): AST, ALT, ALKPHOS, BILITOT, PROT, ALBUMIN in the last 72 hours. No results for input(s): LIPASE, AMYLASE in the last 72 hours. Cardiac Enzymes No results for input(s): CKTOTAL, CKMB, CKMBINDEX, TROPONINI in the last 72 hours.  BNP: BNP (last 3 results) Recent Labs    09/28/2021 0659  BNP 344.3*    ProBNP (last 3 results) No results for input(s): PROBNP in the last 8760 hours.   D-Dimer No results for input(s): DDIMER in the last 72 hours. Hemoglobin A1C No results for input(s): HGBA1C in the last 72 hours.  Fasting Lipid Panel No results for input(s): CHOL, HDL, LDLCALC, TRIG, CHOLHDL, LDLDIRECT in the last 72 hours. Thyroid Function Tests No results for input(s): TSH, T4TOTAL, T3FREE, THYROIDAB in the last 72 hours.  Invalid input(s): FREET3  Other results:   Imaging    DG Swallowing Func-Speech Pathology  Result Date: 10/13/2021 Table formatting from the original result was not included. Objective Swallowing Evaluation: Type of Study: MBS-Modified Barium Swallow Study  Patient Details Name: Woodward Klem Lose Sr. MRN: [id number] Date of Birth: [birth year] Today's Date: 10/13/2021 Time:  SLP Start Time (ACUTE ONLY): 1300 -SLP Stop Time (ACUTE ONLY): 1320 SLP Time Calculation (min) (ACUTE ONLY): 20 min Past Medical History: Past Medical History: Diagnosis Date  Arteriosclerosis of coronary artery 10/29/2015  Overview:  with mi   Arthritis   BPH (benign prostatic hyperplasia)   Diabetes mellitus without complication ([place])   Gout   Heart attack ([place]) 1996  Hypertension   Prostate cancer ([place])   Stroke [facility])  Past Surgical History: Past Surgical History: Procedure Laterality Date  CHOLECYSTECTOMY    COLONOSCOPY N/A 05/01/2018  Procedure: COLONOSCOPY;   Surgeon: Virgel Manifold, MD;  Location: [facility] ENDOSCOPY;  Service: Endoscopy;  Laterality: N/A;  CORONARY STENT INTERVENTION W/IMPELLA N/A 09/21/2021  Procedure: Coronary Stent Intervention w/Impella;  Surgeon: Nelva Bush, MD;  Location: [place] CV LAB;  Service: Cardiovascular;  Laterality: N/A;  ESOPHAGOGASTRODUODENOSCOPY N/A 05/01/2018  Procedure: ESOPHAGOGASTRODUODENOSCOPY (EGD);  Surgeon: Virgel Manifold, MD;  Location: [facility] ENDOSCOPY;  Service: Endoscopy;  Laterality: N/A;  RIGHT HEART CATHETERIZATION WITH ADENOSINE STUDY  1996  RIGHT/LEFT HEART CATH AND CORONARY ANGIOGRAPHY N/A 09/13/2021  Procedure: RIGHT/LEFT HEART CATH AND CORONARY ANGIOGRAPHY;  Surgeon: Nelva Bush, MD;  Location: [place] CV LAB;  Service: Cardiovascular;  Laterality: N/A;  VENTRICULAR ASSIST DEVICE INSERTION N/A 10/03/2021  Procedure: VENTRICULAR ASSIST DEVICE INSERTION;  Surgeon: Nelva Bush, MD;  Location: [place] CV LAB;  Service: Cardiovascular;  Laterality: N/A; HPI: 70 year old man who remains critically ill due to acute hypoxic and hypercarbic respiratory failure require mechanical ventilation as well as acute decompensated systolic heart failure requiring inotropic and vasopressor support. Intubated 10/28-11/1. NSTEMI  No data recorded Assessment / Plan / Recommendation CHL IP CLINICAL IMPRESSIONS 10/13/2021 Clinical Impression Pt presents with pharyngeal dysphagia characterized by reduced base of tongue retraction, a pharyngeal delay, reduced hyolaryngeal elevation, and reduced anterior laryngeal movement. He demonstrated incomplete epiglottic inversion, vallecular residue, and penetration (PAS 3, 5) with thin liquids via cup and straw. Prompted coughing was effective in expelling penetrated material. Aspiration was not definitively seen while the fluoro was on, but coughing was noted between swallows after an episode of penetration (PAS 5) and aspiration of penetrate is suspected. A  chin tuck posture with slight left head turn improved penetration to PAS 2,3 but did not eliminate it. Amount of vallecular residue increased with bolus size and with advancement of bolus consistency. Residue was eliminated with multiple liquid washes, but thin liquids was more effective. The barium tablet (given with nectar thick liquids) became lodged in the valleculae, and it was resistant to movement with additional boluses of nectar thick liquids. Transport was facilitated with two boluses of puree, but movement was then halted again at the level of pyriform sinuses. Multiple additional boluses of nectar thick liquids were necessary to transport the bolus the the cervical esophagus. Esophageal stasis was noted throughout the middle and lower thoracic esophagus with retrograde flow to the upper thoracic esophagus; esophageal assessment would be necessary to determine the etiology of this. A dysphagia 3 diet with nectar thick liquids will be initiated at this time with observance of swallowing precautions. SLP will follow to assess tolerance and for diet advancement as clinically indicated. SLP Visit Diagnosis Dysphagia, pharyngeal phase (R13.13) Attention and concentration deficit following -- Frontal lobe and executive function deficit following -- Impact on safety and function Mild aspiration risk   CHL IP TREATMENT RECOMMENDATION 10/13/2021 Treatment Recommendations Therapy as outlined in treatment plan below   Prognosis 10/13/2021 Prognosis for Safe Diet Advancement Good Barriers to Reach Goals  Cognitive deficits Barriers/Prognosis Comment -- CHL IP DIET RECOMMENDATION 10/13/2021 SLP Diet Recommendations Dysphagia 3 (Mech soft) solids;Nectar thick liquid Liquid Administration via Cup;Straw Medication Administration Crushed with puree Compensations Slow rate;Small sips/bites;Follow solids with liquid;Effortful swallow Postural Changes Seated upright at 90 degrees   CHL IP OTHER RECOMMENDATIONS 10/13/2021  Recommended Consults -- Oral Care Recommendations -- Other Recommendations Order thickener from pharmacy   CHL IP FOLLOW UP RECOMMENDATIONS 10/13/2021 Follow up Recommendations None   CHL IP FREQUENCY AND DURATION 10/13/2021 Speech Therapy Frequency (ACUTE ONLY) min 2x/week Treatment Duration 2 weeks      CHL IP ORAL PHASE 10/13/2021 Oral Phase WFL Oral - Pudding Teaspoon -- Oral - Pudding Cup -- Oral - Honey Teaspoon -- Oral - Honey Cup -- Oral - Nectar Teaspoon -- Oral - Nectar Cup -- Oral - Nectar Straw -- Oral - Thin Teaspoon -- Oral - Thin Cup -- Oral - Thin Straw -- Oral - Puree -- Oral - Mech Soft -- Oral - Regular -- Oral - Multi-Consistency -- Oral - Pill -- Oral Phase - Comment --  CHL IP PHARYNGEAL PHASE 10/13/2021 Pharyngeal Phase Impaired Pharyngeal- Pudding Teaspoon -- Pharyngeal -- Pharyngeal- Pudding Cup -- Pharyngeal -- Pharyngeal- Honey Teaspoon -- Pharyngeal -- Pharyngeal- Honey Cup -- Pharyngeal -- Pharyngeal- Nectar Teaspoon -- Pharyngeal -- Pharyngeal- Nectar Cup Reduced anterior laryngeal mobility;Reduced laryngeal elevation;Reduced tongue base retraction;Reduced epiglottic inversion;Pharyngeal residue - valleculae Pharyngeal -- Pharyngeal- Nectar Straw Reduced anterior laryngeal mobility;Reduced laryngeal elevation;Reduced tongue base retraction;Reduced epiglottic inversion;Pharyngeal residue - valleculae Pharyngeal -- Pharyngeal- Thin Teaspoon -- Pharyngeal -- Pharyngeal- Thin Cup Reduced anterior laryngeal mobility;Reduced laryngeal elevation;Reduced tongue base retraction;Reduced epiglottic inversion;Pharyngeal residue - valleculae;Penetration/Aspiration during swallow;Penetration/Apiration after swallow Pharyngeal Material enters airway, remains ABOVE vocal cords and not ejected out;Material enters airway, CONTACTS cords and not ejected out Pharyngeal- Thin Straw Reduced anterior laryngeal mobility;Reduced laryngeal elevation;Reduced tongue base retraction;Reduced epiglottic  inversion;Pharyngeal residue - valleculae;Penetration/Aspiration during swallow;Penetration/Apiration after swallow Pharyngeal Material enters airway, remains ABOVE vocal cords and not ejected out;Material enters airway, CONTACTS cords and not ejected out Pharyngeal- Puree Reduced anterior laryngeal mobility;Reduced laryngeal elevation;Reduced tongue base retraction;Reduced epiglottic inversion;Pharyngeal residue - valleculae Pharyngeal -- Pharyngeal- Mechanical Soft -- Pharyngeal -- Pharyngeal- Regular Reduced anterior laryngeal mobility;Reduced laryngeal elevation;Reduced tongue base retraction;Reduced epiglottic inversion;Pharyngeal residue - valleculae Pharyngeal -- Pharyngeal- Multi-consistency -- Pharyngeal -- Pharyngeal- Pill Reduced anterior laryngeal mobility;Reduced laryngeal elevation;Reduced tongue base retraction;Reduced epiglottic inversion;Pharyngeal residue - valleculae Pharyngeal -- Pharyngeal Comment --  CHL IP CERVICAL ESOPHAGEAL PHASE 10/13/2021 Cervical Esophageal Phase Impaired Pudding Teaspoon -- Pudding Cup -- Honey Teaspoon -- Honey Cup -- Nectar Teaspoon -- Nectar Cup -- Nectar Straw (No Data) Thin Teaspoon -- Thin Cup -- Thin Straw -- Puree -- Mechanical Soft -- Regular -- Multi-consistency -- Pill (No Data) Cervical Esophageal Comment See impressions Shanika I. Hardin Negus, [place], [place] Office number [phone] Pager [phone] Horton Marshall 10/13/2021, 2:51 PM                Medications:     Scheduled Medications:  amiodarone  200 mg Per Tube BID   aspirin  81 mg Per Tube Daily   atorvastatin  80 mg Per Tube Daily   chlorhexidine gluconate (MEDLINE KIT)  15 mL Mouth Rinse BID   Chlorhexidine Gluconate Cloth  6 each Topical Daily   digoxin  0.125 mg Per Tube Daily   docusate  100 mg Per Tube BID   enoxaparin (LOVENOX) injection  50 mg Subcutaneous Q24H   feeding supplement (PROSource  TF)  45 mL Per Tube TID   insulin aspart  0-15 Units  Subcutaneous Q4H   insulin glargine-yfgn  8 Units Subcutaneous QHS   mouth rinse  15 mL Mouth Rinse BID   melatonin  3 mg Oral QHS   polyethylene glycol  17 g Per Tube Daily   potassium chloride  40 mEq Per Tube BID   sacubitril-valsartan  1 tablet Oral BID   sodium chloride flush  10-40 mL Intracatheter Q12H   sodium chloride flush  10-40 mL Intracatheter Q12H   sodium chloride flush  3 mL Intravenous Q12H   spironolactone  25 mg Per Tube Daily   ticagrelor  90 mg Per Tube BID    Infusions:  sodium chloride Stopped (10/13/21 0857)   feeding supplement (VITAL 1.5 CAL) 1,000 mL (10/13/21 1702)   sodium chloride irrigation      PRN Medications: sodium chloride, acetaminophen (TYLENOL) oral liquid 160 mg/5 mL, food thickener, hydrALAZINE, sodium chloride flush, sodium chloride flush, sodium chloride flush   Assessment/Plan   1. CAD/NSTEMI:  Prior inferior MI and placement of 2 stents to RCA in 1996.  Admitted with NSTEMI, found to have 75% distal LM stenosis with occluded proximal LAD and ostial LCx.  S/p PCI distal LM through mid LAD, ostial LCx remains occluded. No s/s angina.  - ASA + Brilinta.  - Atorvastatin.  2. Acute systolic CHF -> cardiogenic shock: Ischemic cardiomyopathy.  EF 20-25% on echo. RV normal.  Impella placed in cath lab with cardiogenic shock.  Impella pulled 10/31 - milrinone stopped 11/3. Co-ox 63%  - CVP 3-4. No diuretic today. - Continue entresto 24/26 mg BID - Continue spiro 25 mg daily - Continue digoxin 0.125 mg daily - No beta blocker d/t recent shock - SGLT2i once able to take pills - Will not adjust GDMT any further today d/t low volume.  3. NSVT/PVCs: Frequent initially now has settled down on amiodarone gtt.    - Now on po amio 4. Acute hypoxemic respiratory failure with pulmonary edema and HCAP: Intubated 10/27. CXR 10/30 with bibasilar infiltrates.  Also with low grade fever to 100.2 and thick secretions. AF today - Procalcitonin 0.18, blood  cultures NGTD. GPC and GNR on sputum, (rare group B strep) - Completing ceftriaxone today - Extubated 11/01. 02 sats stable on RA 5. Type 2 diabetes: SSI.  - SGLT2i prior to d/c 6. AKI: In setting of cardiogenic shock.   Scr 2.4 > 2.2 -> 1.88 ->1.6 -> 1.2. Near baseline of 1.1 7. Anemia, acute blood loss - Hgb improving with removing foley and CBI. Remove foley today. 8. Thrombocytopenia: -Plt 88-> 92->111K->140 -> 160. Improved after Impella out 9. Dysphagia: -Frequent coughing with po intake -Made NPO -Swallow eval again today  Transferring out of ICU to [facility]  Very weak. Likely won't be able to tolerate CIR. Considering LTACH  Length of Stay: [place], LINDSAY N, PA-C  10/14/2021, 8:01 AM  Advanced Heart Failure Team Pager [phone] (M-F; 7a - 5p)  Please contact [facility] for night-coverage after hours (5p -7a ) and weekends on [email or web address]   Patient seen and examined with the above-signed Advanced Practice Provider and/or Housestaff. I personally reviewed laboratory data, imaging studies and relevant notes. I independently examined the patient and formulated the important aspects of the plan. I have edited the note to reflect any of my changes or salient points. I have personally discussed the plan with the patient and/or family.  He is weak and confused  this am. Co-ox stable off milrinone. CVP low. BP now high. No CP or SOB. Hematuria resolved with CBI.  General:  Lying in bed Weak  No resp difficulty HEENT: normal + cor-trak Neck: supple. no JVD. Carotids 2+ bilat; no bruits. No lymphadenopathy or thryomegaly appreciated. Cor: PMI nondisplaced. Regular rate & rhythm. No rubs, gallops or murmurs. Lungs: clear Abdomen: soft, nontender, nondistended. No hepatosplenomegaly. No bruits or masses. Good bowel sounds. Extremities: no cyanosis, clubbing, rash, edema + foley Neuro: communicative cranial nerves grossly intact. moves all 4 extremities w/o difficulty. Affect  pleasant  Remains very weak. CAD and CHF currently stable off milrinone. Will increase Entresto.   Can pull CBI Foley. Watch for urinary retention.   Will need SNF vs LTACH. D/w TRH at bedside. Appreciate their care.   Glori Bickers, MD  4:59 PM

## 2021-10-14 NOTE — Progress Notes (Signed)
Speech Language Pathology Treatment: Dysphagia  Patient Details Name: Warren Parcell Newman Sr. MRN: [id number] DOB: [birth year] Today's Date: 10/14/2021 Time: 9458-5929 SLP Time Calculation (min) (ACUTE ONLY): 20 min  Assessment / Plan / Recommendation Clinical Impression  Pt seen with wife at bedside. RN and MD report pt was made NPO due to coughing this am. Wife reports pt was unable to masticate dys 3 solids textures, particularly meat chunks. SLP reviewed MBS visually with pt  and wife and explained nature of pts mild dysphagia and benefit of brief trial of thickened liquids and other compensatory strategies related to mild pharyngeal residue and esophageal dysphagia. Pt is expected to cough occasionally with intake and wife confirms he was a frequent throat clearer at baseline. Will downgrade solid texture to dys 2/finely chopped given particularly difficulty with solids sticking in pts pharynx. Also reiterated need for crushed pills to RN. Pt may resume a dys2/nectar thick diet with allowance for milkshakes. Expect upgrade to thins after removal of Cortrak.   HPI HPI: 70 year old man who remains critically ill due to acute hypoxic and hypercarbic respiratory failure require mechanical ventilation as well as acute decompensated systolic heart failure requiring inotropic and vasopressor support. Intubated 10/28-11/1. NSTEMI      SLP Plan  Continue with current plan of care      Recommendations for follow up therapy are one component of a multi-disciplinary discharge planning process, led by the attending physician.  Recommendations may be updated based on patient status, additional functional criteria and insurance authorization.    Recommendations  Diet recommendations: Dysphagia 2 (fine chop);Thin liquid Liquids provided via: Cup;Straw Medication Administration: Crushed with puree Supervision: Patient able to self feed Compensations: Follow solids with liquid                Follow  up Recommendations: 24 hour supervision/assistance SLP Visit Diagnosis: Dysphagia, pharyngeal phase (R13.13) Plan: Continue with current plan of care       GO                Warren Stone, Katherene Ponto  10/14/2021, 12:00 PM

## 2021-10-14 NOTE — Progress Notes (Addendum)
[facility] PROGRESS NOTE    Progress Note  Warren Maltese Bartoszek Sr.  ZOX:[id number] DOB: 20-Jan-1951 DOA: 09/13/2021 PCP: [name], MD     Brief Narrative:   Warren Rabbani Inniss Sr. is an 70 y.o. male past medical history of CAD status post stent x2 to the RCA, essential hypertension, diabetes mellitus type 2 prostate cancer who comes into the ED on 09/13/2021 for right-sided chest pain later developed shortness of breath which was improved with nitro found to have an NSTEMI and respiratory distress and intubated.  At [facility] was taken to the Cath Lab found to 75% distal LM occlusion, LAD and left circumflex, underwent Impella supported PCI with DES placement to the mid LAD transferred to the [facility] under PCCM due to acute respiratory failure    Significant Events: 10/28 [facility] for chest pain. Taken to cath lab, ruled in for NSTEMI, intubated for resp distress, underwent impella supported PCI/DES with LM to LAD intervention. Transferred to [facility] for cardiogenic shock. PCCM consulted for vent  10/31 tolerating PSV 11/1 extubation  Assessment/Plan:   Acute respiratory failure with hypoxia requiring mechanical ventilation/acute pulmonary edema/Cardiogenic shock due to biventricular failure requiring milrinone and Impella support/acute systolic heart failure: Currently saturating greater than 95% on room air. Appears euvolemic on no diuretics today. Has been extubated and will completed 7-day course of antibiotics. Core track has been placed for nutritional purposes. Now on milrinone by the heart failure team the advanced heart failure team is on board milrinone has been stopped he relates no need for diuretics. Physical therapy evaluated the patient and recommended inpatient rehab, the patient is too weak and debilitated for inpatient rehab. Will consult TOC to see if LTAC would be appropriate.  NSTEMI: Inferior status post 2 stent found to have a 75% distal LM  stenosis occluded proximal LAD and ostial circumflex status post PCI. Continue DAPT therapy and statins.  Acute systolic heart failure/cardiogenic shock: Impella placed in Cath Lab with cardiogenic shock, discontinue on 10/10/2021. Now off milrinone, on no diuretics. Currently on Entresto, Aldactone and digoxin. Not a candidate for beta-blocker at this time due to recent shock. Wilder Glade as an outpatient.  NSVT/PVCs: Initially frequently. Loaded with IV amiodarone now on oral.  Community-acquired pneumonia: Now extubated on 10/12/2019 with a fever 100.4 and thick copious amount of secretions. Started empirically on vancomycin and cefepime on 07/09/2020, now transition to Rocephin since 10/11/2021 we will complete a 7-day course.  Diabetes mellitus type 2: On long-acting insulin plus sliding scale blood glucose ranging around 200. Increase long-acting insulin currently with a core track.  Nutrition: Plan for MBBS per speech. Currently with a core track and tube feedings.    DVT prophylaxis: asa and brillinta, lovenox Family Communication:wife Status is: Inpatient  Remains inpatient appropriate because: Acute severity of illness    Code Status:     Code Status Orders  (From admission, onward)           Start     Ordered   09/19/2021 1803  Full code  Continuous        09/10/2021 1805           Code Status History     Date Active Date Inactive Code Status Order ID Comments User Context   04/30/2018 0426 05/01/2018 2108 Full Code [id number]  Harrie Foreman, MD Inpatient      Advance Directive Documentation    Flowsheet Row Most Recent Value  Type of Advance Directive Healthcare Power of [place], [place]  will  Pre-existing out of facility DNR order (yellow form or pink MOST form) --  "MOST" Form in Place? --         IV Access:   Peripheral IV   Procedures and diagnostic studies:   DG Abd Portable 1V  Result Date: 10/12/2021 CLINICAL DATA:  Tube  placement EXAM: PORTABLE ABDOMEN - 1 VIEW COMPARISON:  Abdominal x-ray 10/11/2021 FINDINGS: Enteric tube tip is in the stomach slightly right of midline. No gaseous distended loops of bowel identified. Surgical clips in the right upper quadrant. IMPRESSION: Enteric tube tip in the stomach. Electronically Signed   By: Ofilia Neas M.D.   On: 10/12/2021 10:29   DG Swallowing Func-Speech Pathology  Result Date: 10/13/2021 Table formatting from the original result was not included. Objective Swallowing Evaluation: Type of Study: MBS-Modified Barium Swallow Study  Patient Details Name: Warren Jagiello Mamone Sr. MRN: [id number] Date of Birth: February 11, 1951 Today's Date: 10/13/2021 Time: SLP Start Time (ACUTE ONLY): 1300 -SLP Stop Time (ACUTE ONLY): 1320 SLP Time Calculation (min) (ACUTE ONLY): 20 min Past Medical History: Past Medical History: Diagnosis Date  Arteriosclerosis of coronary artery 10/29/2015  Overview:  with mi   Arthritis   BPH (benign prostatic hyperplasia)   Diabetes mellitus without complication ([place])   Gout   Heart attack ([place]) 1996  Hypertension   Prostate cancer ([place])   Stroke [facility])  Past Surgical History: Past Surgical History: Procedure Laterality Date  CHOLECYSTECTOMY    COLONOSCOPY N/A 05/01/2018  Procedure: COLONOSCOPY;  Surgeon: Warren Manifold, MD;  Location: [facility] ENDOSCOPY;  Service: Endoscopy;  Laterality: N/A;  CORONARY STENT INTERVENTION W/IMPELLA N/A 09/23/2021  Procedure: Coronary Stent Intervention w/Impella;  Surgeon: Warren Bush, MD;  Location: [place] CV LAB;  Service: Cardiovascular;  Laterality: N/A;  ESOPHAGOGASTRODUODENOSCOPY N/A 05/01/2018  Procedure: ESOPHAGOGASTRODUODENOSCOPY (EGD);  Surgeon: Warren Manifold, MD;  Location: [facility] ENDOSCOPY;  Service: Endoscopy;  Laterality: N/A;  RIGHT HEART CATHETERIZATION WITH ADENOSINE STUDY  1996  RIGHT/LEFT HEART CATH AND CORONARY ANGIOGRAPHY N/A 10/04/2021  Procedure: RIGHT/LEFT HEART CATH AND CORONARY ANGIOGRAPHY;   Surgeon: Warren Bush, MD;  Location: [place] CV LAB;  Service: Cardiovascular;  Laterality: N/A;  VENTRICULAR ASSIST DEVICE INSERTION N/A 09/29/2021  Procedure: VENTRICULAR ASSIST DEVICE INSERTION;  Surgeon: Warren Bush, MD;  Location: [place] CV LAB;  Service: Cardiovascular;  Laterality: N/A; HPI: 70 year old man who remains critically ill due to acute hypoxic and hypercarbic respiratory failure require mechanical ventilation as well as acute decompensated systolic heart failure requiring inotropic and vasopressor support. Intubated 10/28-11/1. NSTEMI  No data recorded Assessment / Plan / Recommendation CHL IP CLINICAL IMPRESSIONS 10/13/2021 Clinical Impression Pt presents with pharyngeal dysphagia characterized by reduced base of tongue retraction, a pharyngeal delay, reduced hyolaryngeal elevation, and reduced anterior laryngeal movement. He demonstrated incomplete epiglottic inversion, vallecular residue, and penetration (PAS 3, 5) with thin liquids via cup and straw. Prompted coughing was effective in expelling penetrated material. Aspiration was not definitively seen while the fluoro was on, but coughing was noted between swallows after an episode of penetration (PAS 5) and aspiration of penetrate is suspected. A chin tuck posture with slight left head turn improved penetration to PAS 2,3 but did not eliminate it. Amount of vallecular residue increased with bolus size and with advancement of bolus consistency. Residue was eliminated with multiple liquid washes, but thin liquids was more effective. The barium tablet (given with nectar thick liquids) became lodged in the valleculae, and it was resistant to movement with additional boluses of  nectar thick liquids. Transport was facilitated with two boluses of puree, but movement was then halted again at the level of pyriform sinuses. Multiple additional boluses of nectar thick liquids were necessary to transport the bolus the the cervical  esophagus. Esophageal stasis was noted throughout the middle and lower thoracic esophagus with retrograde flow to the upper thoracic esophagus; esophageal assessment would be necessary to determine the etiology of this. A dysphagia 3 diet with nectar thick liquids will be initiated at this time with observance of swallowing precautions. SLP will follow to assess tolerance and for diet advancement as clinically indicated. SLP Visit Diagnosis Dysphagia, pharyngeal phase (R13.13) Attention and concentration deficit following -- Frontal lobe and executive function deficit following -- Impact on safety and function Mild aspiration risk   CHL IP TREATMENT RECOMMENDATION 10/13/2021 Treatment Recommendations Therapy as outlined in treatment plan below   Prognosis 10/13/2021 Prognosis for Safe Diet Advancement Good Barriers to Reach Goals Cognitive deficits Barriers/Prognosis Comment -- CHL IP DIET RECOMMENDATION 10/13/2021 SLP Diet Recommendations Dysphagia 3 (Mech soft) solids;Nectar thick liquid Liquid Administration via Cup;Straw Medication Administration Crushed with puree Compensations Slow rate;Small sips/bites;Follow solids with liquid;Effortful swallow Postural Changes Seated upright at 90 degrees   CHL IP OTHER RECOMMENDATIONS 10/13/2021 Recommended Consults -- Oral Care Recommendations -- Other Recommendations Order thickener from pharmacy   CHL IP FOLLOW UP RECOMMENDATIONS 10/13/2021 Follow up Recommendations None   CHL IP FREQUENCY AND DURATION 10/13/2021 Speech Therapy Frequency (ACUTE ONLY) min 2x/week Treatment Duration 2 weeks      CHL IP ORAL PHASE 10/13/2021 Oral Phase WFL Oral - Pudding Teaspoon -- Oral - Pudding Cup -- Oral - Honey Teaspoon -- Oral - Honey Cup -- Oral - Nectar Teaspoon -- Oral - Nectar Cup -- Oral - Nectar Straw -- Oral - Thin Teaspoon -- Oral - Thin Cup -- Oral - Thin Straw -- Oral - Puree -- Oral - Mech Soft -- Oral - Regular -- Oral - Multi-Consistency -- Oral - Pill -- Oral Phase - Comment  --  CHL IP PHARYNGEAL PHASE 10/13/2021 Pharyngeal Phase Impaired Pharyngeal- Pudding Teaspoon -- Pharyngeal -- Pharyngeal- Pudding Cup -- Pharyngeal -- Pharyngeal- Honey Teaspoon -- Pharyngeal -- Pharyngeal- Honey Cup -- Pharyngeal -- Pharyngeal- Nectar Teaspoon -- Pharyngeal -- Pharyngeal- Nectar Cup Reduced anterior laryngeal mobility;Reduced laryngeal elevation;Reduced tongue base retraction;Reduced epiglottic inversion;Pharyngeal residue - valleculae Pharyngeal -- Pharyngeal- Nectar Straw Reduced anterior laryngeal mobility;Reduced laryngeal elevation;Reduced tongue base retraction;Reduced epiglottic inversion;Pharyngeal residue - valleculae Pharyngeal -- Pharyngeal- Thin Teaspoon -- Pharyngeal -- Pharyngeal- Thin Cup Reduced anterior laryngeal mobility;Reduced laryngeal elevation;Reduced tongue base retraction;Reduced epiglottic inversion;Pharyngeal residue - valleculae;Penetration/Aspiration during swallow;Penetration/Apiration after swallow Pharyngeal Material enters airway, remains ABOVE vocal cords and not ejected out;Material enters airway, CONTACTS cords and not ejected out Pharyngeal- Thin Straw Reduced anterior laryngeal mobility;Reduced laryngeal elevation;Reduced tongue base retraction;Reduced epiglottic inversion;Pharyngeal residue - valleculae;Penetration/Aspiration during swallow;Penetration/Apiration after swallow Pharyngeal Material enters airway, remains ABOVE vocal cords and not ejected out;Material enters airway, CONTACTS cords and not ejected out Pharyngeal- Puree Reduced anterior laryngeal mobility;Reduced laryngeal elevation;Reduced tongue base retraction;Reduced epiglottic inversion;Pharyngeal residue - valleculae Pharyngeal -- Pharyngeal- Mechanical Soft -- Pharyngeal -- Pharyngeal- Regular Reduced anterior laryngeal mobility;Reduced laryngeal elevation;Reduced tongue base retraction;Reduced epiglottic inversion;Pharyngeal residue - valleculae Pharyngeal -- Pharyngeal- Multi-consistency --  Pharyngeal -- Pharyngeal- Pill Reduced anterior laryngeal mobility;Reduced laryngeal elevation;Reduced tongue base retraction;Reduced epiglottic inversion;Pharyngeal residue - valleculae Pharyngeal -- Pharyngeal Comment --  CHL IP CERVICAL ESOPHAGEAL PHASE 10/13/2021 Cervical Esophageal Phase Impaired Pudding Teaspoon -- Pudding Cup -- Honey Teaspoon --  Honey Cup -- Nectar Teaspoon -- Nectar Cup -- Nectar Straw (No Data) Thin Teaspoon -- Thin Cup -- Thin Straw -- Puree -- Mechanical Soft -- Regular -- Multi-consistency -- Pill (No Data) Cervical Esophageal Comment See impressions Shanika I. Hardin Negus, [place], [place] Office number [phone] Pager [place] 10/13/2021, 2:51 PM                Medical Consultants:   None.   Subjective:    Vestal Markin Swift Sr. in a good mood this morning.  Objective:    Vitals:   10/14/21 0300 10/14/21 0359 10/14/21 0400 10/14/21 0600  BP: (!) 119/59  (!) 141/71 140/66  Pulse: 71  73 78  Resp: 19  16 (!) 23  Temp:  98 F (36.7 C)    TempSrc:  Axillary    SpO2: 100%  100% 100%  Weight:    108.7 kg  Height:       SpO2: 100 % O2 Flow Rate (L/min): 4 L/min FiO2 (%): 30 %   Intake/Output Summary (Last 24 hours) at 10/14/2021 0804 Last data filed at 10/14/2021 0600 Gross per 24 hour  Intake 4614.54 ml  Output 3451 ml  Net 1163.54 ml   Filed Weights   10/12/21 0500 10/13/21 0338 10/14/21 0600  Weight: 107.6 kg 106.1 kg 108.7 kg    Exam: General exam: In no acute distress. Respiratory system: Good air movement and clear to auscultation. Cardiovascular system: S1 & S2 heard, RRR. No JVD. Gastrointestinal system: Abdomen is nondistended, soft and nontender.  Extremities: No pedal edema. Psychiatry: J judgment and insight appeared intact.   Data Reviewed:    Labs: Basic Metabolic Panel: Recent Labs  Lab 10/08/21 1017 10/08/21 1700 10/09/21 0402 10/09/21 0823 10/10/21 0354  10/11/21 0415 10/12/21 0308 10/13/21 0301 10/14/21 0445  NA  --  138 137  --  137 140 139 143 144  K  --  4.2 4.0  --  4.4 3.9 3.7 3.8 4.3  CL  --  103 103  --  104 104 102 110 111  CO2  --  25 24  --  _0 GLUCOSE  --  194* 192*  --  193* 187* 164* 200* 228*  BUN  --  36* 49*  --  61* 63* 62* 53* 47*  CREATININE  --  2.12* 2.44*  --  2.20* 1.88* 1.61* 1.23 1.19  CALCIUM  --  8.5* 8.6*  --  8.8* 8.8* 8.9 9.2 9.8  MG  --   --   --    < > 2.3 2.0 2.2 2.2 2.1  PHOS 4.2 4.3 4.4  --  3.5 3.4  --   --   --    < > = values in this interval not displayed.   GFR Estimated Creatinine Clearance: 74.7 mL/min (by C-G formula based on SCr of 1.19 mg/dL). Liver Function Tests: No results for input(s): AST, ALT, ALKPHOS, BILITOT, PROT, ALBUMIN in the last 168 hours. No results for input(s): LIPASE, AMYLASE in the last 168 hours. No results for input(s): AMMONIA in the last 168 hours. Coagulation profile Recent Labs  Lab 09/11/2021 1055  INR 1.2   COVID-19 Labs  Recent Labs    10/12/21 0308 10/13/21 0301  LDH 447* 382*    Lab Results  Component Value Date   SARSCOV2NAA NEGATIVE 10/06/2021    CBC: Recent Labs  Lab 10/10/21 0354 10/11/21 0415 10/12/21 0308 10/13/21 0301 10/14/21  0445  WBC 9.0 7.7 8.3 8.4 9.7  HGB 10.7* 10.5* 11.2* 11.9* 11.6*  HCT 33.6* 32.6* 34.4* 36.9* 36.9*  MCV 85.1 84.2 82.7 82.9 84.2  PLT 88* 92* 111* 140* 160   Cardiac Enzymes: No results for input(s): CKTOTAL, CKMB, CKMBINDEX, TROPONINI in the last 168 hours. BNP (last 3 results) No results for input(s): PROBNP in the last 8760 hours. CBG: Recent Labs  Lab 10/13/21 0805 10/13/21 1047 10/13/21 1932 10/13/21 2347 10/14/21 0352  GLUCAP 209* 227* 228* 187* 209*   D-Dimer: No results for input(s): DDIMER in the last 72 hours. Hgb A1c: No results for input(s): HGBA1C in the last 72 hours. Lipid Profile: No results for input(s): CHOL, HDL, LDLCALC, TRIG, CHOLHDL, LDLDIRECT in the  last 72 hours. Thyroid function studies: No results for input(s): TSH, T4TOTAL, T3FREE, THYROIDAB in the last 72 hours.  Invalid input(s): FREET3 Anemia work up: No results for input(s): VITAMINB12, FOLATE, FERRITIN, TIBC, IRON, RETICCTPCT in the last 72 hours. Sepsis Labs: Recent Labs  Lab 09/20/2021 1847 10/08/21 0409 10/08/21 0840 10/09/21 0402 10/09/21 0823 10/09/21 1657 10/11/21 0415 10/12/21 0308 10/13/21 0301 10/14/21 0445  PROCALCITON  --   --   --   --  0.18  --   --   --   --   --   WBC  --    < >  --    < > 9.3   < > 7.7 8.3 8.4 9.7  LATICACIDVEN 1.8  --  1.2  --   --   --   --   --   --   --    < > = values in this interval not displayed.   Microbiology Recent Results (from the past 240 hour(s))  Resp Panel by RT-PCR (Flu A&B, Covid) Nasopharyngeal Swab     Status: None   Collection Time: 09/15/2021  9:20 AM   Specimen: Nasopharyngeal Swab; Nasopharyngeal(NP) swabs in vial transport medium  Result Value Ref Range Status   SARS Coronavirus 2 by RT PCR NEGATIVE NEGATIVE Final    Comment: (NOTE) SARS-CoV-2 target nucleic acids are NOT DETECTED.  The SARS-CoV-2 RNA is generally detectable in upper respiratory specimens during the acute phase of infection. The lowest concentration of SARS-CoV-2 viral copies this assay can detect is 138 copies/mL. A negative result does not preclude SARS-Cov-2 infection and should not be used as the sole basis for treatment or other patient management decisions. A negative result may occur with  improper specimen collection/handling, submission of specimen other than nasopharyngeal swab, presence of viral mutation(s) within the areas targeted by this assay, and inadequate number of viral copies(<138 copies/mL). A negative result must be combined with clinical observations, patient history, and epidemiological information. The expected result is Negative.  Fact Sheet for Patients:  EntrepreneurPulse.com.au  Fact  Sheet for Healthcare Providers:  IncredibleEmployment.be  This test is no t yet approved or cleared by the [place] FDA and  has been authorized for detection and/or diagnosis of SARS-CoV-2 by FDA under an Emergency Use Authorization (EUA). This EUA will remain  in effect (meaning this test can be used) for the duration of the COVID-19 declaration under Section 564(b)(1) of the Act, 21 U.S.C.section 360bbb-3(b)(1), unless the authorization is terminated  or revoked sooner.       Influenza A by PCR NEGATIVE NEGATIVE Final   Influenza B by PCR NEGATIVE NEGATIVE Final    Comment: (NOTE) The Xpert Xpress SARS-CoV-2/FLU/RSV plus assay is intended as an aid in  the diagnosis of influenza from Nasopharyngeal swab specimens and should not be used as a sole basis for treatment. Nasal washings and aspirates are unacceptable for Xpert Xpress SARS-CoV-2/FLU/RSV testing.  Fact Sheet for Patients: EntrepreneurPulse.com.au  Fact Sheet for Healthcare Providers: IncredibleEmployment.be  This test is not yet approved or cleared by the [place] FDA and has been authorized for detection and/or diagnosis of SARS-CoV-2 by FDA under an Emergency Use Authorization (EUA). This EUA will remain in effect (meaning this test can be used) for the duration of the COVID-19 declaration under Section 564(b)(1) of the Act, 21 U.S.C. section 360bbb-3(b)(1), unless the authorization is terminated or revoked.  Performed at [facility], [address] [place], [place] [address]   Surgical PCR screen     Status: None   Collection Time: 10/02/2021  7:07 PM   Specimen: Nasal Mucosa; Nasal Swab  Result Value Ref Range Status   MRSA, PCR NEGATIVE NEGATIVE Final   Staphylococcus aureus NEGATIVE NEGATIVE Final    Comment: (NOTE) The Xpert SA Assay (FDA approved for NASAL specimens in patients 25 years of age and older), is one component of a  comprehensive surveillance program. It is not intended to diagnose infection nor to guide or monitor treatment. Performed at [facility], [place] [address] [place], [place] [address]   Culture, Respiratory w Gram Stain     Status: None   Collection Time: 10/09/21  8:34 AM   Specimen: Tracheal Aspirate; Respiratory  Result Value Ref Range Status   Specimen Description TRACHEAL ASPIRATE  Final   Special Requests NONE  Final   Gram Stain   Final    RARE SQUAMOUS EPITHELIAL CELLS PRESENT ABUNDANT WBC PRESENT, PREDOMINANTLY PMN FEW GRAM POSITIVE COCCI FEW GRAM NEGATIVE RODS    Culture   Final    FEW GROUP B STREP(S.AGALACTIAE)ISOLATED TESTING AGAINST S. AGALACTIAE NOT ROUTINELY PERFORMED DUE TO PREDICTABILITY OF AMP/PEN/VAN SUSCEPTIBILITY. Performed at [facility], [place] [address], [place], [place] [address]    Report Status 10/11/2021 FINAL  Final  Culture, blood (routine x 2)     Status: None (Preliminary result)   Collection Time: 10/09/21  9:24 AM   Specimen: BLOOD  Result Value Ref Range Status   Specimen Description BLOOD BLOOD LEFT FOREARM  Final   Special Requests   Final    BOTTLES DRAWN AEROBIC AND ANAEROBIC Blood Culture adequate volume   Culture   Final    NO GROWTH 4 DAYS Performed at [facility], [place] [address], [place], [place] [address]    Report Status PENDING  Incomplete  Culture, blood (routine x 2)     Status: None (Preliminary result)   Collection Time: 10/09/21  9:35 AM   Specimen: BLOOD  Result Value Ref Range Status   Specimen Description BLOOD BLOOD RIGHT HAND  Final   Special Requests   Final    BOTTLES DRAWN AEROBIC AND ANAEROBIC Blood Culture adequate volume   Culture   Final    NO GROWTH 4 DAYS Performed at [facility], [place] [address], [place],  [address]    Report Status PENDING  Incomplete     Medications:    amiodarone  200 mg Per Tube BID   aspirin  81 mg Per Tube Daily   atorvastatin  80 mg Per Tube  Daily   chlorhexidine gluconate (MEDLINE KIT)  15 mL Mouth Rinse BID   Chlorhexidine Gluconate Cloth  6 each Topical Daily  digoxin  0.125 mg Per Tube Daily   docusate  100 mg Per Tube BID   enoxaparin (LOVENOX) injection  50 mg Subcutaneous Q24H   feeding supplement (PROSource TF)  45 mL Per Tube TID   insulin aspart  0-15 Units Subcutaneous Q4H   insulin glargine-yfgn  8 Units Subcutaneous QHS   mouth rinse  15 mL Mouth Rinse BID   melatonin  3 mg Oral QHS   polyethylene glycol  17 g Per Tube Daily   potassium chloride  40 mEq Per Tube BID   sacubitril-valsartan  1 tablet Oral BID   sodium chloride flush  10-40 mL Intracatheter Q12H   sodium chloride flush  10-40 mL Intracatheter Q12H   sodium chloride flush  3 mL Intravenous Q12H   spironolactone  25 mg Per Tube Daily   ticagrelor  90 mg Per Tube BID   Continuous Infusions:  sodium chloride Stopped (10/13/21 0857)   feeding supplement (VITAL 1.5 CAL) 1,000 mL (10/13/21 1702)   sodium chloride irrigation        LOS: 7 days   Charlynne Cousins  [facility]  10/14/2021, 8:04 AM

## 2021-10-15 ENCOUNTER — Inpatient Hospital Stay (HOSPITAL_COMMUNITY): Payer: Medicare Other

## 2021-10-15 DIAGNOSIS — I214 Non-ST elevation (NSTEMI) myocardial infarction: Principal | ICD-10-CM

## 2021-10-15 DIAGNOSIS — J9602 Acute respiratory failure with hypercapnia: Secondary | ICD-10-CM

## 2021-10-15 DIAGNOSIS — Z515 Encounter for palliative care: Secondary | ICD-10-CM

## 2021-10-15 DIAGNOSIS — I5082 Biventricular heart failure: Secondary | ICD-10-CM

## 2021-10-15 DIAGNOSIS — Z7189 Other specified counseling: Secondary | ICD-10-CM

## 2021-10-15 DIAGNOSIS — E119 Type 2 diabetes mellitus without complications: Secondary | ICD-10-CM

## 2021-10-15 DIAGNOSIS — R0602 Shortness of breath: Secondary | ICD-10-CM

## 2021-10-15 LAB — GLUCOSE, CAPILLARY
Glucose-Capillary: 186 mg/dL — ABNORMAL HIGH (ref 70–99)
Glucose-Capillary: 206 mg/dL — ABNORMAL HIGH (ref 70–99)
Glucose-Capillary: 197 mg/dL — ABNORMAL HIGH (ref 70–99)
Glucose-Capillary: 259 mg/dL — ABNORMAL HIGH (ref 70–99)
Glucose-Capillary: 280 mg/dL — ABNORMAL HIGH (ref 70–99)
Glucose-Capillary: 216 mg/dL — ABNORMAL HIGH (ref 70–99)
Glucose-Capillary: 203 mg/dL — ABNORMAL HIGH (ref 70–99)

## 2021-10-15 LAB — BASIC METABOLIC PANEL
Anion gap: 8 (ref 5–15)
BUN: 40 mg/dL — ABNORMAL HIGH (ref 8–23)
CO2: 24 mmol/L (ref 22–32)
Calcium: 9.8 mg/dL (ref 8.9–10.3)
Chloride: 110 mmol/L (ref 98–111)
Creatinine, Ser: 1.21 mg/dL (ref 0.61–1.24)
GFR, Estimated: >60 mL/min (ref >60)
Glucose, Bld: 204 mg/dL — ABNORMAL HIGH (ref 70–99)
Potassium: 4.7 mmol/L (ref 3.5–5.1)
Sodium: 142 mmol/L (ref 135–145)

## 2021-10-15 LAB — CBC
HCT: 36.3 % — ABNORMAL LOW (ref 39.0–52.0)
Hemoglobin: 11.3 g/dL — ABNORMAL LOW (ref 13.0–17.0)
MCH: 26.5 pg (ref 26.0–34.0)
MCHC: 31.1 g/dL (ref 30.0–36.0)
MCV: 85 fL (ref 80.0–100.0)
Platelets: 178 10*3/uL (ref 150–400)
RBC: 4.27 MIL/uL (ref 4.22–5.81)
RDW: 14.8 % (ref 11.5–15.5)
WBC: 11.1 10*3/uL — ABNORMAL HIGH (ref 4.0–10.5)
nRBC: 0 % (ref 0.0–0.2)

## 2021-10-15 LAB — COOXEMETRY PANEL
Carboxyhemoglobin: 1.3 % (ref 0.5–1.5)
Methemoglobin: 1 % (ref 0.0–1.5)
O2 Saturation: 53 %
Total hemoglobin: 11.9 g/dL — ABNORMAL LOW (ref 12.0–16.0)

## 2021-10-15 LAB — MAGNESIUM: Magnesium: 2.1 mg/dL (ref 1.7–2.4)

## 2021-10-15 MED ORDER — HYDROCORTISONE 1 % EX LOTN
1.0000 "application " | TOPICAL_LOTION | Freq: Three times a day (TID) | CUTANEOUS | Status: DC
  Filled 2021-10-15: qty 118

## 2021-10-15 MED ORDER — IPRATROPIUM BROMIDE 0.02 % IN SOLN
0.5000 mg | RESPIRATORY_TRACT | Status: DC
  Administered 2021-10-15 (×2): 0.5 mg via RESPIRATORY_TRACT
  Filled 2021-10-15 (×2): qty 2.5

## 2021-10-15 MED ORDER — IPRATROPIUM BROMIDE 0.02 % IN SOLN
0.5000 mg | Freq: Two times a day (BID) | RESPIRATORY_TRACT | Status: DC
  Administered 2021-10-16: 0.5 mg via RESPIRATORY_TRACT
  Filled 2021-10-15 (×2): qty 2.5

## 2021-10-15 MED ORDER — ALBUTEROL SULFATE (2.5 MG/3ML) 0.083% IN NEBU
2.5000 mg | INHALATION_SOLUTION | Freq: Four times a day (QID) | RESPIRATORY_TRACT | Status: DC | PRN
  Administered 2021-10-15 – 2021-10-16 (×2): 2.5 mg via RESPIRATORY_TRACT
  Filled 2021-10-15 (×2): qty 3

## 2021-10-15 MED ORDER — FUROSEMIDE 10 MG/ML IJ SOLN
40.0000 mg | Freq: Once | INTRAMUSCULAR | Status: AC
  Administered 2021-10-15: 40 mg via INTRAVENOUS
  Filled 2021-10-15: qty 4

## 2021-10-15 MED ORDER — ALBUTEROL SULFATE (2.5 MG/3ML) 0.083% IN NEBU
INHALATION_SOLUTION | RESPIRATORY_TRACT | Status: AC
  Filled 2021-10-15: qty 3

## 2021-10-15 MED ORDER — INSULIN GLARGINE-YFGN 100 UNIT/ML ~~LOC~~ SOLN
10.0000 [IU] | Freq: Two times a day (BID) | SUBCUTANEOUS | Status: DC
  Administered 2021-10-15 (×2): 10 [IU] via SUBCUTANEOUS
  Filled 2021-10-15 (×4): qty 0.1

## 2021-10-15 MED ORDER — IPRATROPIUM BROMIDE 0.02 % IN SOLN
RESPIRATORY_TRACT | Status: AC
  Administered 2021-10-15: 0.5 mg via RESPIRATORY_TRACT
  Filled 2021-10-15: qty 2.5

## 2021-10-15 MED ORDER — HYDROCORTISONE 1 % EX CREA
1.0000 "application " | TOPICAL_CREAM | Freq: Three times a day (TID) | CUTANEOUS | Status: DC
  Administered 2021-10-15 – 2021-10-16 (×2): 1 via TOPICAL
  Filled 2021-10-15: qty 28

## 2021-10-15 MED ORDER — GUAIFENESIN 100 MG/5ML PO LIQD
5.0000 mL | ORAL | Status: DC | PRN
  Administered 2021-10-15 – 2021-10-16 (×3): 5 mL via ORAL
  Filled 2021-10-15 (×3): qty 5

## 2021-10-15 NOTE — Progress Notes (Signed)
[facility] PROGRESS NOTE    Progress Note  Warren Stone.  SNK:[id number] DOB: [birth year] DOA: 10/02/2021 PCP: [name], MD     Brief Narrative:   Warren Stone. is an 70 y.o. male past medical history of CAD status post stent x2 to the RCA, essential hypertension, diabetes mellitus type 2 prostate cancer who comes into the ED on 10/10/2021 for right-sided chest pain later developed shortness of breath which was improved with nitro found to have an NSTEMI and respiratory distress and intubated.  At [facility] was taken to the Cath Lab found to 75% distal LM occlusion, LAD and left circumflex, underwent Impella supported PCI with DES placement to the mid LAD transferred to the [facility] under PCCM due to acute respiratory failure.Impella placed in Cath Lab with cardiogenic shock, discontinue on 10/10/2021.  Significant Events: 10/28 [facility] for chest pain. Taken to cath lab, ruled in for NSTEMI, intubated for resp distress, underwent impella supported PCI/DES with LM to LAD intervention. Transferred to [facility] for cardiogenic shock. PCCM consulted for vent  10/31 tolerating PSV 11/1 extubation  Assessment/Plan:   Acute respiratory failure with hypoxia requiring mechanical ventilation/acute pulmonary edema/Cardiogenic shock due to biventricular failure requiring milrinone and Impella support/acute systolic heart failure: Currently saturating greater than 95% on room air. Continues to be euvolemic. Extubated has completed a course of antibiotics. Core track has been placed for nutritional purposes. Off milrinone appreciated Advance heart failure assistance. Increasing Entresto. Physical therapy evaluated the patient and recommended inpatient rehab, the patient is too weak and debilitated for inpatient rehab. We have consulted TOC for SNF versus LTAC.  NSTEMI: Inferior status post 2 stent found to have a 75% distal LM stenosis occluded proximal LAD and  ostial circumflex status post PCI. Continue DAPT therapy and statins.  Acute systolic heart failure/cardiogenic shock: Now on oral amiodarone.  Currently on Entresto, Aldactone and digoxin. Wilder Glade as an outpatient.  NSVT/PVCs: Initially frequently. Now on oral amio.  Community-acquired pneumonia: Has completed his course of antibiotic. Mild rise in leukocytosis yesterday we will continue to monitor fever curve.  Diabetes mellitus type 2: Still with a core track blood glucose trending up we will increase long-acting insulin.  Nutrition: Speech reevaluated the patient recommended dysphagia 2 diet. Hopefully we can take out Coretrack over the weekend.    DVT prophylaxis: asa and brillinta, lovenox Family Communication:wife Status is: Inpatient  Remains inpatient appropriate because: Acute severity of illness    Code Status:     Code Status Orders  (From admission, onward)           Start     Ordered   09/27/2021 1803  Full code  Continuous        09/30/2021 1805           Code Status History     Date Active Date Inactive Code Status Order ID Comments User Context   04/30/2018 0426 05/01/2018 2108 Full Code [id number]  Harrie Foreman, MD Inpatient      Advance Directive Documentation    Flowsheet Row Most Recent Value  Type of Advance Directive Healthcare Power of [place], Living will  Pre-existing out of facility DNR order (yellow form or pink MOST form) --  "MOST" Form in Place? --         IV Access:   Peripheral IV   Procedures and diagnostic studies:   DG Swallowing Func-Speech Pathology  Result Date: 10/13/2021 Table formatting from the original result was not included.  Objective Swallowing Evaluation: Type of Study: MBS-Modified Barium Swallow Study  Patient Details Name: Warren Stone. MRN: [id number] Date of Birth: 08/26/1951 Today's Date: 10/13/2021 Time: SLP Start Time (ACUTE ONLY): 1300 -SLP Stop Time (ACUTE ONLY): 1320 SLP  Time Calculation (min) (ACUTE ONLY): 20 min Past Medical History: Past Medical History: Diagnosis Date  Arteriosclerosis of coronary artery 10/29/2015  Overview:  with mi   Arthritis   BPH (benign prostatic hyperplasia)   Diabetes mellitus without complication ([place])   Gout   Heart attack ([place]) 1996  Hypertension   Prostate cancer ([place])   Stroke [facility])  Past Surgical History: Past Surgical History: Procedure Laterality Date  CHOLECYSTECTOMY    COLONOSCOPY N/A 05/01/2018  Procedure: COLONOSCOPY;  Surgeon: Virgel Manifold, MD;  Location: [facility] ENDOSCOPY;  Service: Endoscopy;  Laterality: N/A;  CORONARY STENT INTERVENTION W/IMPELLA N/A 10/06/2021  Procedure: Coronary Stent Intervention w/Impella;  Surgeon: Nelva Bush, MD;  Location: [facility] CV LAB;  Service: Cardiovascular;  Laterality: N/A;  ESOPHAGOGASTRODUODENOSCOPY N/A 05/01/2018  Procedure: ESOPHAGOGASTRODUODENOSCOPY (EGD);  Surgeon: Virgel Manifold, MD;  Location: [facility] ENDOSCOPY;  Service: Endoscopy;  Laterality: N/A;  RIGHT HEART CATHETERIZATION WITH ADENOSINE STUDY  1996  RIGHT/LEFT HEART CATH AND CORONARY ANGIOGRAPHY N/A 10/04/2021  Procedure: RIGHT/LEFT HEART CATH AND CORONARY ANGIOGRAPHY;  Surgeon: Nelva Bush, MD;  Location: [place] [facility] LAB;  Service: Cardiovascular;  Laterality: N/A;  VENTRICULAR ASSIST DEVICE INSERTION N/A 09/10/2021  Procedure: VENTRICULAR ASSIST DEVICE INSERTION;  Surgeon: Nelva Bush, MD;  Location: [place] CV LAB;  Service: Cardiovascular;  Laterality: N/A; HPI: 70 year old man who remains critically ill due to acute hypoxic and hypercarbic respiratory failure require mechanical ventilation as well as acute decompensated systolic heart failure requiring inotropic and vasopressor support. Intubated 10/28-11/1. NSTEMI  No data recorded Assessment / Plan / Recommendation CHL IP CLINICAL IMPRESSIONS 10/13/2021 Clinical Impression Pt presents with pharyngeal dysphagia characterized by reduced base of  tongue retraction, a pharyngeal delay, reduced hyolaryngeal elevation, and reduced anterior laryngeal movement. He demonstrated incomplete epiglottic inversion, vallecular residue, and penetration (PAS 3, 5) with thin liquids via cup and straw. Prompted coughing was effective in expelling penetrated material. Aspiration was not definitively seen while the fluoro was on, but coughing was noted between swallows after an episode of penetration (PAS 5) and aspiration of penetrate is suspected. A chin tuck posture with slight left head turn improved penetration to PAS 2,3 but did not eliminate it. Amount of vallecular residue increased with bolus size and with advancement of bolus consistency. Residue was eliminated with multiple liquid washes, but thin liquids was more effective. The barium tablet (given with nectar thick liquids) became lodged in the valleculae, and it was resistant to movement with additional boluses of nectar thick liquids. Transport was facilitated with two boluses of puree, but movement was then halted again at the level of pyriform sinuses. Multiple additional boluses of nectar thick liquids were necessary to transport the bolus the the cervical esophagus. Esophageal stasis was noted throughout the middle and lower thoracic esophagus with retrograde flow to the upper thoracic esophagus; esophageal assessment would be necessary to determine the etiology of this. A dysphagia 3 diet with nectar thick liquids will be initiated at this time with observance of swallowing precautions. SLP will follow to assess tolerance and for diet advancement as clinically indicated. SLP Visit Diagnosis Dysphagia, pharyngeal phase (R13.13) Attention and concentration deficit following -- Frontal lobe and executive function deficit following -- Impact on safety and function Mild aspiration risk  CHL IP TREATMENT RECOMMENDATION 10/13/2021 Treatment Recommendations Therapy as outlined in treatment plan below   Prognosis  10/13/2021 Prognosis for Safe Diet Advancement Good Barriers to Reach Goals Cognitive deficits Barriers/Prognosis Comment -- CHL IP DIET RECOMMENDATION 10/13/2021 SLP Diet Recommendations Dysphagia 3 (Mech soft) solids;Nectar thick liquid Liquid Administration via Cup;Straw Medication Administration Crushed with puree Compensations Slow rate;Small sips/bites;Follow solids with liquid;Effortful swallow Postural Changes Seated upright at 90 degrees   CHL IP OTHER RECOMMENDATIONS 10/13/2021 Recommended Consults -- Oral Care Recommendations -- Other Recommendations Order thickener from pharmacy   CHL IP FOLLOW UP RECOMMENDATIONS 10/13/2021 Follow up Recommendations None   CHL IP FREQUENCY AND DURATION 10/13/2021 Speech Therapy Frequency (ACUTE ONLY) min 2x/week Treatment Duration 2 weeks      CHL IP ORAL PHASE 10/13/2021 Oral Phase WFL Oral - Pudding Teaspoon -- Oral - Pudding Cup -- Oral - Honey Teaspoon -- Oral - Honey Cup -- Oral - Nectar Teaspoon -- Oral - Nectar Cup -- Oral - Nectar Straw -- Oral - Thin Teaspoon -- Oral - Thin Cup -- Oral - Thin Straw -- Oral - Puree -- Oral - Mech Soft -- Oral - Regular -- Oral - Multi-Consistency -- Oral - Pill -- Oral Phase - Comment --  CHL IP PHARYNGEAL PHASE 10/13/2021 Pharyngeal Phase Impaired Pharyngeal- Pudding Teaspoon -- Pharyngeal -- Pharyngeal- Pudding Cup -- Pharyngeal -- Pharyngeal- Honey Teaspoon -- Pharyngeal -- Pharyngeal- Honey Cup -- Pharyngeal -- Pharyngeal- Nectar Teaspoon -- Pharyngeal -- Pharyngeal- Nectar Cup Reduced anterior laryngeal mobility;Reduced laryngeal elevation;Reduced tongue base retraction;Reduced epiglottic inversion;Pharyngeal residue - valleculae Pharyngeal -- Pharyngeal- Nectar Straw Reduced anterior laryngeal mobility;Reduced laryngeal elevation;Reduced tongue base retraction;Reduced epiglottic inversion;Pharyngeal residue - valleculae Pharyngeal -- Pharyngeal- Thin Teaspoon -- Pharyngeal -- Pharyngeal- Thin Cup Reduced anterior laryngeal  mobility;Reduced laryngeal elevation;Reduced tongue base retraction;Reduced epiglottic inversion;Pharyngeal residue - valleculae;Penetration/Aspiration during swallow;Penetration/Apiration after swallow Pharyngeal Material enters airway, remains ABOVE vocal cords and not ejected out;Material enters airway, CONTACTS cords and not ejected out Pharyngeal- Thin Straw Reduced anterior laryngeal mobility;Reduced laryngeal elevation;Reduced tongue base retraction;Reduced epiglottic inversion;Pharyngeal residue - valleculae;Penetration/Aspiration during swallow;Penetration/Apiration after swallow Pharyngeal Material enters airway, remains ABOVE vocal cords and not ejected out;Material enters airway, CONTACTS cords and not ejected out Pharyngeal- Puree Reduced anterior laryngeal mobility;Reduced laryngeal elevation;Reduced tongue base retraction;Reduced epiglottic inversion;Pharyngeal residue - valleculae Pharyngeal -- Pharyngeal- Mechanical Soft -- Pharyngeal -- Pharyngeal- Regular Reduced anterior laryngeal mobility;Reduced laryngeal elevation;Reduced tongue base retraction;Reduced epiglottic inversion;Pharyngeal residue - valleculae Pharyngeal -- Pharyngeal- Multi-consistency -- Pharyngeal -- Pharyngeal- Pill Reduced anterior laryngeal mobility;Reduced laryngeal elevation;Reduced tongue base retraction;Reduced epiglottic inversion;Pharyngeal residue - valleculae Pharyngeal -- Pharyngeal Comment --  CHL IP CERVICAL ESOPHAGEAL PHASE 10/13/2021 Cervical Esophageal Phase Impaired Pudding Teaspoon -- Pudding Cup -- Honey Teaspoon -- Honey Cup -- Nectar Teaspoon -- Nectar Cup -- Nectar Straw (No Data) Thin Teaspoon -- Thin Cup -- Thin Straw -- Puree -- Mechanical Soft -- Regular -- Multi-consistency -- Pill (No Data) Cervical Esophageal Comment See impressions Shanika I. Hardin Negus, [place], [place] Office number [phone] Pager [place] 10/13/2021, 2:51 PM                 Medical Consultants:   None.   Subjective:    Waymond Cera Soltis Stone. relates did not have a good night sleep nothing was hurting.  Objective:    Vitals:   10/14/21 2301 10/15/21 0300 10/15/21 0636 10/15/21 0727  BP: 124/60 (!) 130/59  (!) 142/64  Pulse: 75 84  80  Resp: 20  20  15  Temp: 98.6 F (37 C) 98.4 F (36.9 C)  98.4 F (36.9 C)  TempSrc: Oral Oral  Oral  SpO2: 97% 96%  100%  Weight:   106.2 kg   Height:       SpO2: 100 % O2 Flow Rate (L/min): 4 L/min FiO2 (%): 30 %   Intake/Output Summary (Last 24 hours) at 10/15/2021 0806 Last data filed at 10/15/2021 0435 Gross per 24 hour  Intake 1324.67 ml  Output 1000 ml  Net 324.67 ml    Filed Weights   10/13/21 0338 10/14/21 0600 10/15/21 0636  Weight: 106.1 kg 108.7 kg 106.2 kg    Exam: General exam: In no acute distress. Respiratory system: Good air movement and clear to auscultation. Cardiovascular system: S1 & S2 heard, RRR. No JVD. Gastrointestinal system: Abdomen is nondistended, soft and nontender.  Extremities: No pedal edema. Skin: No rashes, lesions or ulcers Psychiatry: Judgment and insight appeared intact.   Data Reviewed:    Labs: Basic Metabolic Panel: Recent Labs  Lab 10/08/21 1017 10/08/21 1700 10/08/21 1700 10/09/21 0402 10/09/21 7672 10/10/21 0354 10/11/21 0415 10/12/21 0308 10/13/21 0301 10/14/21 0445 10/15/21 0443  NA  --  138   < > 137  --  137 140 139 143 144 142  K  --  4.2   < > 4.0  --  4.4 3.9 3.7 3.8 4.3 4.7  CL  --  103   < > 103  --  104 104 102 110 111 110  CO2  --  25   < > 24  --  _0 GLUCOSE  --  194*   < > 192*  --  193* 187* 164* 200* 228* 204*  BUN  --  36*   < > 49*  --  61* 63* 62* 53* 47* 40*  CREATININE  --  2.12*   < > 2.44*  --  2.20* 1.88* 1.61* 1.23 1.19 1.21  CALCIUM  --  8.5*   < > 8.6*  --  8.8* 8.8* 8.9 9.2 9.8 9.8  MG  --   --   --   --    < > 2.3 2.0 2.2 2.2 2.1 2.1  PHOS 4.2 4.3  --  4.4  --  3.5 3.4  --   --   --    --    < > = values in this interval not displayed.    GFR Estimated Creatinine Clearance: 72.6 mL/min (by C-G formula based on SCr of 1.21 mg/dL). Liver Function Tests: No results for input(s): AST, ALT, ALKPHOS, BILITOT, PROT, ALBUMIN in the last 168 hours. No results for input(s): LIPASE, AMYLASE in the last 168 hours. No results for input(s): AMMONIA in the last 168 hours. Coagulation profile No results for input(s): INR, PROTIME in the last 168 hours.  COVID-19 Labs  Recent Labs    10/13/21 0301  LDH 382*     Lab Results  Component Value Date   SARSCOV2NAA NEGATIVE 09/20/2021    CBC: Recent Labs  Lab 10/11/21 0415 10/12/21 0308 10/13/21 0301 10/14/21 0445 10/15/21 0443  WBC 7.7 8.3 8.4 9.7 11.1*  HGB 10.5* 11.2* 11.9* 11.6* 11.3*  HCT 32.6* 34.4* 36.9* 36.9* 36.3*  MCV 84.2 82.7 82.9 84.2 85.0  PLT 92* 111* 140* 160 178    Cardiac Enzymes: No results for input(s): CKTOTAL, CKMB, CKMBINDEX, TROPONINI in the last 168 hours. BNP (last 3 results) No results for input(s):  PROBNP in the last 8760 hours. CBG: Recent Labs  Lab 10/14/21 1643 10/14/21 1928 10/14/21 2300 10/15/21 0314 10/15/21 0731  GLUCAP 180* 215* 190* 186* 206*    D-Dimer: No results for input(s): DDIMER in the last 72 hours. Hgb A1c: No results for input(s): HGBA1C in the last 72 hours. Lipid Profile: No results for input(s): CHOL, HDL, LDLCALC, TRIG, CHOLHDL, LDLDIRECT in the last 72 hours. Thyroid function studies: No results for input(s): TSH, T4TOTAL, T3FREE, THYROIDAB in the last 72 hours.  Invalid input(s): FREET3 Anemia work up: No results for input(s): VITAMINB12, FOLATE, FERRITIN, TIBC, IRON, RETICCTPCT in the last 72 hours. Sepsis Labs: Recent Labs  Lab 10/08/21 0840 10/09/21 0402 10/09/21 0823 10/09/21 1657 10/12/21 0308 10/13/21 0301 10/14/21 0445 10/15/21 0443  PROCALCITON  --   --  0.18  --   --   --   --   --   WBC  --    < > 9.3   < > 8.3 8.4 9.7 11.1*   LATICACIDVEN 1.2  --   --   --   --   --   --   --    < > = values in this interval not displayed.    Microbiology Recent Results (from the past 240 hour(s))  Resp Panel by RT-PCR (Flu A&B, Covid) Nasopharyngeal Swab     Status: None   Collection Time: 10/04/2021  9:20 AM   Specimen: Nasopharyngeal Swab; Nasopharyngeal(NP) swabs in vial transport medium  Result Value Ref Range Status   SARS Coronavirus 2 by RT PCR NEGATIVE NEGATIVE Final    Comment: (NOTE) SARS-CoV-2 target nucleic acids are NOT DETECTED.  The SARS-CoV-2 RNA is generally detectable in upper respiratory specimens during the acute phase of infection. The lowest concentration of SARS-CoV-2 viral copies this assay can detect is 138 copies/mL. A negative result does not preclude SARS-Cov-2 infection and should not be used as the sole basis for treatment or other patient management decisions. A negative result may occur with  improper specimen collection/handling, submission of specimen other than nasopharyngeal swab, presence of viral mutation(s) within the areas targeted by this assay, and inadequate number of viral copies(<138 copies/mL). A negative result must be combined with clinical observations, patient history, and epidemiological information. The expected result is Negative.  Fact Sheet for Patients:  EntrepreneurPulse.com.au  Fact Sheet for Healthcare Providers:  IncredibleEmployment.be  This test is no t yet approved or cleared by the [place] FDA and  has been authorized for detection and/or diagnosis of SARS-CoV-2 by FDA under an Emergency Use Authorization (EUA). This EUA will remain  in effect (meaning this test can be used) for the duration of the COVID-19 declaration under Section 564(b)(1) of the Act, 21 U.S.C.section 360bbb-3(b)(1), unless the authorization is terminated  or revoked sooner.       Influenza A by PCR NEGATIVE NEGATIVE Final   Influenza B  by PCR NEGATIVE NEGATIVE Final    Comment: (NOTE) The Xpert Xpress SARS-CoV-2/FLU/RSV plus assay is intended as an aid in the diagnosis of influenza from Nasopharyngeal swab specimens and should not be used as a sole basis for treatment. Nasal washings and aspirates are unacceptable for Xpert Xpress SARS-CoV-2/FLU/RSV testing.  Fact Sheet for Patients: EntrepreneurPulse.com.au  Fact Sheet for Healthcare Providers: IncredibleEmployment.be  This test is not yet approved or cleared by the [place] FDA and has been authorized for detection and/or diagnosis of SARS-CoV-2 by FDA under an Emergency Use Authorization (EUA). This EUA will remain in  effect (meaning this test can be used) for the duration of the COVID-19 declaration under Section 564(b)(1) of the Act, 21 U.S.C. section 360bbb-3(b)(1), unless the authorization is terminated or revoked.  Performed at [facility], [address], [place], [place] [address]   Surgical PCR screen     Status: None   Collection Time: 09/26/2021  7:07 PM   Specimen: Nasal Mucosa; Nasal Swab  Result Value Ref Range Status   MRSA, PCR NEGATIVE NEGATIVE Final   Staphylococcus aureus NEGATIVE NEGATIVE Final    Comment: (NOTE) The Xpert SA Assay (FDA approved for NASAL specimens in patients 66 years of age and older), is one component of a comprehensive surveillance program. It is not intended to diagnose infection nor to guide or monitor treatment. Performed at [facility], [place] [address] [place], [place] [address]   Culture, Respiratory w Gram Stain     Status: None   Collection Time: 10/09/21  8:34 AM   Specimen: Tracheal Aspirate; Respiratory  Result Value Ref Range Status   Specimen Description TRACHEAL ASPIRATE  Final   Special Requests NONE  Final   Gram Stain   Final    RARE SQUAMOUS EPITHELIAL CELLS PRESENT ABUNDANT WBC PRESENT, PREDOMINANTLY PMN FEW GRAM POSITIVE COCCI FEW  GRAM NEGATIVE RODS    Culture   Final    FEW GROUP B STREP(S.AGALACTIAE)ISOLATED TESTING AGAINST S. AGALACTIAE NOT ROUTINELY PERFORMED DUE TO PREDICTABILITY OF AMP/PEN/VAN SUSCEPTIBILITY. Performed at [facility], [place] [address] [place], [place] [address]    Report Status 10/11/2021 FINAL  Final  Culture, blood (routine x 2)     Status: None   Collection Time: 10/09/21  9:24 AM   Specimen: BLOOD  Result Value Ref Range Status   Specimen Description BLOOD BLOOD LEFT FOREARM  Final   Special Requests   Final    BOTTLES DRAWN AEROBIC AND ANAEROBIC Blood Culture adequate volume   Culture   Final    NO GROWTH 5 DAYS Performed at [facility], [place] [address], [place], [place] [address]    Report Status 10/14/2021 FINAL  Final  Culture, blood (routine x 2)     Status: None   Collection Time: 10/09/21  9:35 AM   Specimen: BLOOD  Result Value Ref Range Status   Specimen Description BLOOD BLOOD RIGHT HAND  Final   Special Requests   Final    BOTTLES DRAWN AEROBIC AND ANAEROBIC Blood Culture adequate volume   Culture   Final    NO GROWTH 5 DAYS Performed at [facility], [place] [address] [place], [place] [address]    Report Status 10/14/2021 FINAL  Final     Medications:    amiodarone  200 mg Per Tube BID   aspirin  81 mg Per Tube Daily   atorvastatin  80 mg Per Tube Daily   chlorhexidine gluconate (MEDLINE KIT)  15 mL Mouth Rinse BID   Chlorhexidine Gluconate Cloth  6 each Topical Daily   digoxin  0.125 mg Per Tube Daily   docusate  100 mg Per Tube BID   enoxaparin (LOVENOX) injection  50 mg Subcutaneous Q24H   feeding supplement (PROSource TF)  45 mL Per Tube TID   insulin aspart  0-15 Units Subcutaneous Q4H   insulin glargine-yfgn  6 Units Subcutaneous BID   mouth rinse  15 mL Mouth Rinse BID   melatonin  3 mg Per Tube QHS   polyethylene glycol  17 g Per  Tube Daily   potassium chloride  40 mEq Per Tube BID   sacubitril-valsartan  1 tablet Per Tube BID    sodium chloride flush  3 mL Intravenous Q12H   spironolactone  25 mg Per Tube Daily   tamsulosin  0.4 mg Oral Daily   ticagrelor  90 mg Per Tube BID   Continuous Infusions:  sodium chloride 10 mL/hr at 10/15/21 0330   feeding supplement (VITAL 1.5 CAL) 1,000 mL (10/15/21 0640)      LOS: 8 days   Charlynne Cousins  [facility]  10/15/2021, 8:06 AM

## 2021-10-15 NOTE — TOC CM/SW Note (Signed)
Spoke with Danise Mina with [facility] who had questions regarding patient's Coretrack. Discussed that patient continues with Coretrack and that Danise Mina will continue to follow for possible LTACH placement as appropriate.   Marthenia Rolling, MSN, RN,BSN Inpatient [facility] Case Manager [phone]

## 2021-10-15 NOTE — Consult Note (Signed)
Palliative Care Consult Note                                  Date: 10/15/2021   Patient Name: Warren Dunaj Burtch Sr.  DOB: [birth year]  MRN: [id number]  Age / Sex: 70 y.o., male  PCP: [name], MD Referring Physician: Aileen Fass, Tammi Klippel, MD  Reason for Consultation: Establishing goals of care  HPI/Patient Profile: 70 y.o. male  with past medical history of CAD status post stent x2 to the RCA, essential hypertension, diabetes mellitus type 2, prostate cancer who presented to the ED with shortness of breath improved with nitro. He was admitted on 09/14/2021 with NSTEMI and respiratory distress.   Taken to the cath lab where he was intubated for respiratory distress, underwent PCI with DES LM to LAD. Impella was placed for cardiogenic shock. Since then he was extubated (10/31) and impella removed (11/1). He is now off milrinone and being followed by the advance heart failure team. EG 20-25% (EF 06/07/21 at [facility] was >55%). Previously followed by [facility].  Weak, debilitated. Dys 2 diet after bedside swallow eval. CoreTrac in place. Evaluating for SNF vs LTAC.  PMT was consulted for [place].  Past Medical History:  Diagnosis Date   Arteriosclerosis of coronary artery 10/29/2015   Overview:  with mi    Arthritis    BPH (benign prostatic hyperplasia)    Diabetes mellitus without complication ([place])    Gout    Heart attack ([place]) 1996   Hypertension    Prostate cancer ([place])    Stroke ([place])     Subjective:   This NP Walden Field reviewed medical records, received report from team, assessed the patient and then meet at the patient's bedside to discuss diagnosis, prognosis, GOC, EOL wishes disposition and options.  I met with the patient and his wife Roxie at the bedside. No other family or friends present.   Concept of Palliative Care was introduced as specialized medical care for people and their families living with serious  illness.  If focuses on providing relief from the symptoms and stress of a serious illness.  The goal is to improve quality of life for both the patient and the family. Values and goals of care important to patient and family were attempted to be elicited.  Created space and opportunity for patient  and family to explore thoughts and feelings regarding current medical situation   Natural trajectory and current clinical status were discussed. Questions and concerns addressed. Patient  encouraged to call with questions or concerns.    Patient/Family Understanding of Illness: Understand he has had "a bad heart attack".  Energy is very weak.  He states he has a lot going on in the past week he went from "a 10 to a 2".  He did have a previous MI 79.  He notes that there is a long road ahead for rehab given his extensive weakness.  They know that his heart is not optimal at this point.  We further discussed his current clinical status, new acute heart failure with possibility of some recovery with medications though this would need to take time to be seen.  They also understand that he may not regain heart function.  I provided answers and clarification as needed.  Life Review: The patient is married to his wife proxy has been for the past 50 years.  They have a son Adib and a  daughter, who is in a wheelchair.  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  He enjoys playing computer games and I am strengthening games such as sudoku.  He has 3 dogs at home, he adores.  Patient Values: Family, dogs.  Goals: His overarching goal is to get better and get home.  However, we discussed to Hope/pray for the best but plan for the worst.  Further discussions related to goals of care as per below.  Today's Discussion: We had a good long discussion.  We reviewed his current clinical status, his  baseline.  He states he had a stress test about 2 months ago and everything was normal.  Recently had a stress test completed which because he was having chest pain and shortness of breath with activity.  For example, he would get winded just going down to the mailbox.  They stated it was normal.  However, he did have an NSTEMI with 2 blockages, 1 of which was able to be stented.  Prior to his current situation he was completely independent.  He was able to drive, care for himself, enjoyed his retired life with his wife.  We discussed that he is profoundly weak.  He currently has a core track tube and to help with feeding.  We discussed his appetite which she states is not great.  He just "is not hungry."  We discussed the need to eat in order to gain strength.  His wife agreed.  She has been bringing him foods that he likes that are consistent with his diet recommendations, including cream potatoes and mashed EMs.  They are trying to encourage him to eat as much as possible and participate in therapy.  He appears to be participating in therapy quite well.  He was actually able to ambulate some yesterday.  We discussed that he has a long road ahead and will require significant rehab in order to get back to any semblance of baseline.  He verbalized understanding.  He is on board with this in order to meet his goal "get better and get home."  His wife verbalized that she does prefer select specialty if he does have to go to an [place].  LTAC versus SNF is dependent on whether or not they are able to remove the core track (for example, if he is able to have adequate oral intake to support his nutritional needs).  I provided general and emotional support via therapeutic listening, shared stories, laughing/jokes, and other techniques.  I answered all questions and addressed all concerns.  Review of Systems  Constitutional:  Positive for activity change, appetite change and fatigue.  Respiratory:  Positive for  shortness of breath (with prolonged talking or activity). Negative for cough.   Gastrointestinal:  Negative for abdominal pain, nausea and vomiting.   Objective:   Primary Diagnoses: Present on Admission:  Cardiogenic shock ([place])  NSTEMI (non-ST elevated myocardial infarction) ([place])  Essential (primary) hypertension   Physical Exam Vitals and nursing note reviewed.  Constitutional:      General: He is awake. He is not in acute distress.    Appearance: He is obese. He is ill-appearing.     Comments: Appears weak and debilitated  HENT:     Head: Normocephalic and atraumatic.  Cardiovascular:     Rate and Rhythm: Normal rate and regular rhythm.     Heart sounds: No murmur heard. Pulmonary:  Effort: Pulmonary effort is normal. No respiratory distress.     Breath sounds: Normal breath sounds. No wheezing or rhonchi.  Abdominal:     Palpations: Abdomen is soft.  Skin:    General: Skin is warm and dry.  Neurological:     General: No focal deficit present.     Mental Status: He is alert.  Psychiatric:        Mood and Affect: Mood normal.        Behavior: Behavior normal.    Vital Signs:  BP 136/69 (BP Location: Left Arm)   Pulse 80   Temp 97.9 F (36.6 C) (Oral)   Resp (!) 22   Ht '6\' 1"'  (1.854 m)   Wt 106.2 kg   SpO2 97%   BMI 30.89 kg/m   Palliative Assessment/Data: 60%    Advanced Care Planning:   Primary Decision Maker: PATIENT  Code Status/Advance Care Planning: DNR  A discussion was had today regarding advanced directives. Concepts specific to code status, artifical feeding and hydration, continued IV antibiotics and rehospitalization was had.  The difference between a aggressive medical intervention path and a palliative comfort care path for this patient at this time was had.  The patient and his wife both agree that he would not want to be intubated again and would not want heroic measures in the case of cardiac arrest.  I confirmed that they wish to  change his status to "DO NOT RESUSCITATE".  As the patient reports that, if it is my time "do not delay the inevitable."  His wife is on board with this.  Otherwise, they would like full scope of treatment to try to fill his goal of "get better and get home."  Decisions/Changes to ACP: Change to DNR Full scope of care otherwise  Assessment & Plan:   Impression: 70 year old male who presents status post NSTEMI with cardiogenic shock.  Fortunately he has made some good progress and his Impella device was discontinued, he was extubated.  He is not profoundly weak and with dysphagia limiting his oral intake.  He subsequently has a core track.  However, they are trying to encourage oral intake so they can pull a core track.  Long-term prognosis is questionable/guarded.  A lot will depend on whether he is able to recover any heart function as well as his ability to have adequate oral intake and strengthen with rehab. Co-Ox trending down (63 -> 53%) and HF team following.  SUMMARY OF RECOMMENDATIONS   Status changed to DNR, [place] form completed Continue full scope of care otherwise Repeatedly encourage oral intake and therapy Monitor for changes/improvement in order to narrow down discharge disposition (SNF versus LTAC) PMT will continue to follow  Symptom Management:  Per primary team PMT is available to assist as needed  Prognosis:  Unable to determine  Discharge Planning:  To Be Determined   Discussed with: Patient, patient's wife, medical team, nursing team    Thank you for allowing [place] to participate in the care of Emannuel Vise Nicodemus Sr. PMT will continue to support holistically.  Time Total: 120 min  Greater than 50%  of this time was spent counseling and coordinating care related to the above assessment and plan.  Signed by: Walden Field, NP Palliative Medicine Team  Team Phone # [phone] (Nights/Weekends)  10/15/2021, 12:41 PM

## 2021-10-15 NOTE — Progress Notes (Addendum)
Patient ID: Warren Kestler Mulnix Sr., male   DOB: 1951-11-29, 70 y.o.   MRN: [id number]     Advanced Heart Failure Rounding Note  PCP-Cardiologist: None   Subjective:   10/31: Impella pulled 11/01: Extubated  Off milrinone co-ox has dropped to 53%,   Very stridorous. Weak. Denies CP.    Objective:   Weight Range: 106.2 kg Body mass index is 30.89 kg/m.   Vital Signs:   Temp:  [97.7 F (36.5 C)-99.4 F (37.4 C)] 98.4 F (36.9 C) (11/05 0727) Pulse Rate:  [73-84] 80 (11/05 0727) Resp:  [15-20] 15 (11/05 0727) BP: (124-142)/(57-65) 142/64 (11/05 0727) SpO2:  [96 %-100 %] 100 % (11/05 0727) Weight:  [106.2 kg] 106.2 kg (11/05 0636) Last BM Date: 10/13/21  Weight change: Filed Weights   10/13/21 0338 10/14/21 0600 10/15/21 0636  Weight: 106.1 kg 108.7 kg 106.2 kg    Intake/Output:   Intake/Output Summary (Last 24 hours) at 10/15/2021 1023 Last data filed at 10/15/2021 0435 Gross per 24 hour  Intake 1324.67 ml  Output 1000 ml  Net 324.67 ml       Physical Exam   General:  Lying in bed. Weak. Stridorous.  HEENT: normal + cor-trak Neck: supple. no JVD. Carotids 2+ bilat; no bruits. No lymphadenopathy or thryomegaly appreciated. Cor: PMI nondisplaced. Regular rate & rhythm. No rubs, gallops or murmurs. Lungs: + severe stridor  Abdomen: obese soft, nontender, nondistended. No hepatosplenomegaly. No bruits or masses. Good bowel sounds. Extremities: no cyanosis, clubbing, rash, edema Neuro: alert & orientedx3, cranial nerves grossly intact. moves all 4 extremities w/o difficulty. Affect pleasant   Telemetry   SR 70-80s Personally reviewed  Labs    CBC Recent Labs    10/14/21 0445 10/15/21 0443  WBC 9.7 11.1*  HGB 11.6* 11.3*  HCT 36.9* 36.3*  MCV 84.2 85.0  PLT 160 174    Basic Metabolic Panel Recent Labs    10/14/21 0445 10/15/21 0443  NA 144 142  K 4.3 4.7  CL 111 110  CO2 27 24  GLUCOSE 228* 204*  BUN 47* 40*  CREATININE 1.19 1.21   CALCIUM 9.8 9.8  MG 2.1 2.1    Liver Function Tests No results for input(s): AST, ALT, ALKPHOS, BILITOT, PROT, ALBUMIN in the last 72 hours. No results for input(s): LIPASE, AMYLASE in the last 72 hours. Cardiac Enzymes No results for input(s): CKTOTAL, CKMB, CKMBINDEX, TROPONINI in the last 72 hours.  BNP: BNP (last 3 results) Recent Labs    09/12/2021 0659  BNP 344.3*     ProBNP (last 3 results) No results for input(s): PROBNP in the last 8760 hours.   D-Dimer No results for input(s): DDIMER in the last 72 hours. Hemoglobin A1C No results for input(s): HGBA1C in the last 72 hours.  Fasting Lipid Panel No results for input(s): CHOL, HDL, LDLCALC, TRIG, CHOLHDL, LDLDIRECT in the last 72 hours. Thyroid Function Tests No results for input(s): TSH, T4TOTAL, T3FREE, THYROIDAB in the last 72 hours.  Invalid input(s): FREET3  Other results:   Imaging    No results found.   Medications:     Scheduled Medications:  amiodarone  200 mg Per Tube BID   aspirin  81 mg Per Tube Daily   atorvastatin  80 mg Per Tube Daily   chlorhexidine gluconate (MEDLINE KIT)  15 mL Mouth Rinse BID   Chlorhexidine Gluconate Cloth  6 each Topical Daily   digoxin  0.125 mg Per Tube Daily   docusate  100  mg Per Tube BID   enoxaparin (LOVENOX) injection  50 mg Subcutaneous Q24H   feeding supplement (PROSource TF)  45 mL Per Tube TID   insulin aspart  0-15 Units Subcutaneous Q4H   insulin glargine-yfgn  10 Units Subcutaneous BID   mouth rinse  15 mL Mouth Rinse BID   melatonin  3 mg Per Tube QHS   polyethylene glycol  17 g Per Tube Daily   potassium chloride  40 mEq Per Tube BID   sacubitril-valsartan  1 tablet Per Tube BID   sodium chloride flush  3 mL Intravenous Q12H   spironolactone  25 mg Per Tube Daily   tamsulosin  0.4 mg Oral Daily   ticagrelor  90 mg Per Tube BID    Infusions:  sodium chloride 10 mL/hr at 10/15/21 0330   feeding supplement (VITAL 1.5 CAL) 1,000 mL  (10/15/21 0640)    PRN Medications: sodium chloride, acetaminophen (TYLENOL) oral liquid 160 mg/5 mL, food thickener, guaiFENesin, hydrALAZINE, sodium chloride flush   Assessment/Plan   1. CAD/NSTEMI:  Prior inferior MI and placement of 2 stents to RCA in 1996.   - Admitted with NSTEMI, found to have 75% distal LM stenosis with occluded proximal LAD and ostial LCx.  S/p PCI distal LM through mid LAD, ostial LCx remains occluded.-> medical rx - No s/s angina. Continue DAPT + statin .  2. Acute systolic CHF -> cardiogenic shock: Ischemic cardiomyopathy.  EF 20-25% on echo. RV normal.  Impella placed in cath lab with cardiogenic shock.  Impella pulled 10/31 - milrinone stopped 11/3. Co-ox 63% -> 53% will continue to trend   - CVP low but given stridor will give one dose IV lasix . - Continue entresto 24/26 mg BID - Continue spiro 25 mg daily - Continue digoxin 0.125 mg daily - No beta blocker d/t recent shock - stop kdur  - Consider SGLT2i once able to take pills and can maintain perineal hygiene 3. NSVT/PVCs: Frequent initially now has settled down on amiodarone gtt.    - Now on po amio. Stable  4. Acute hypoxemic respiratory failure with pulmonary edema and HCAP: Intubated 10/27. CXR 10/30 with bibasilar infiltrates.  Also with low grade fever to 100.2 and thick secretions. AF today - Procalcitonin 0.18, blood cultures NGTD. GPC and GNR on sputum, (rare group B strep) - Completed ceftriaxone on 11/4 - Extubated 11/01. 02 sats stable on RA - very stridorous this am - concern for aspiration  - CVP low but wil give one dose of IV lasix to see if it helps.  - start nebs - check CXR 5. Type 2 diabetes: SSI.  - Consider SGLT2i prior to d/c as above 6. AKI: In setting of cardiogenic shock.   - now resolved 7. Anemia, acute blood loss - Hgb improving with removing foley and CBI. Remove foley today. 8. Thrombocytopenia: -Resolved 9. Dysphagia : -Failed swallow study.  - Cortrak in  place 10. Physical deconditioning - very weak. SNF vs LTAC placement  Will consult Palliative Care team to start [place] discussions.   Length of Stay: [place], MD  10/15/2021, 10:23 AM  Advanced Heart Failure Team Pager [phone] (M-F; 7a - 5p)  Please contact [facility] for night-coverage after hours (5p -7a ) and weekends on [email or web address]

## 2021-10-16 ENCOUNTER — Inpatient Hospital Stay (HOSPITAL_COMMUNITY): Payer: Medicare Other

## 2021-10-16 LAB — URINALYSIS, ROUTINE W REFLEX MICROSCOPIC
Bacteria, UA: NONE SEEN
Bilirubin Urine: NEGATIVE
Glucose, UA: 50 mg/dL — AB
Ketones, ur: NEGATIVE mg/dL
Leukocytes,Ua: NEGATIVE
Nitrite: NEGATIVE
Protein, ur: NEGATIVE mg/dL
Specific Gravity, Urine: 1.017 (ref 1.005–1.030)
pH: 6 (ref 5.0–8.0)

## 2021-10-16 LAB — CBC
HCT: 40 % (ref 39.0–52.0)
Hemoglobin: 12.7 g/dL — ABNORMAL LOW (ref 13.0–17.0)
MCH: 26.7 pg (ref 26.0–34.0)
MCHC: 31.8 g/dL (ref 30.0–36.0)
MCV: 84.2 fL (ref 80.0–100.0)
Platelets: 229 10*3/uL (ref 150–400)
RBC: 4.75 MIL/uL (ref 4.22–5.81)
RDW: 14.5 % (ref 11.5–15.5)
WBC: 15.1 10*3/uL — ABNORMAL HIGH (ref 4.0–10.5)
nRBC: 0 % (ref 0.0–0.2)

## 2021-10-16 LAB — BASIC METABOLIC PANEL
Anion gap: 8 (ref 5–15)
BUN: 44 mg/dL — ABNORMAL HIGH (ref 8–23)
CO2: 26 mmol/L (ref 22–32)
Calcium: 9.8 mg/dL (ref 8.9–10.3)
Chloride: 107 mmol/L (ref 98–111)
Creatinine, Ser: 1.22 mg/dL (ref 0.61–1.24)
GFR, Estimated: >60 mL/min (ref >60)
Glucose, Bld: 257 mg/dL — ABNORMAL HIGH (ref 70–99)
Potassium: 4.8 mmol/L (ref 3.5–5.1)
Sodium: 141 mmol/L (ref 135–145)

## 2021-10-16 LAB — LACTIC ACID, PLASMA: Lactic Acid, Venous: 1.3 mmol/L (ref 0.5–1.9)

## 2021-10-16 LAB — COOXEMETRY PANEL
Carboxyhemoglobin: 0.9 % (ref 0.5–1.5)
Carboxyhemoglobin: 1.3 % (ref 0.5–1.5)
Methemoglobin: 0.7 % (ref 0.0–1.5)
Methemoglobin: 0.9 % (ref 0.0–1.5)
O2 Saturation: 45.7 %
O2 Saturation: 58.7 %
Total hemoglobin: 13 g/dL (ref 12.0–16.0)
Total hemoglobin: 12.3 g/dL (ref 12.0–16.0)

## 2021-10-16 LAB — GLUCOSE, CAPILLARY
Glucose-Capillary: 179 mg/dL — ABNORMAL HIGH (ref 70–99)
Glucose-Capillary: 183 mg/dL — ABNORMAL HIGH (ref 70–99)
Glucose-Capillary: 211 mg/dL — ABNORMAL HIGH (ref 70–99)
Glucose-Capillary: 213 mg/dL — ABNORMAL HIGH (ref 70–99)
Glucose-Capillary: 215 mg/dL — ABNORMAL HIGH (ref 70–99)
Glucose-Capillary: 247 mg/dL — ABNORMAL HIGH (ref 70–99)

## 2021-10-16 LAB — PROCALCITONIN: Procalcitonin: <0.1 ng/mL

## 2021-10-16 LAB — MAGNESIUM: Magnesium: 2 mg/dL (ref 1.7–2.4)

## 2021-10-16 MED ORDER — GLYCOPYRROLATE 1 MG PO TABS
1.0000 mg | ORAL_TABLET | ORAL | Status: DC | PRN
  Filled 2021-10-16: qty 1

## 2021-10-16 MED ORDER — GLYCOPYRROLATE 0.2 MG/ML IJ SOLN
0.2000 mg | INTRAMUSCULAR | Status: DC | PRN
  Administered 2021-10-16 (×2): 0.2 mg via INTRAVENOUS
  Filled 2021-10-16 (×2): qty 1

## 2021-10-16 MED ORDER — MORPHINE SULFATE (PF) 2 MG/ML IV SOLN
2.0000 mg | INTRAVENOUS | Status: DC | PRN
  Administered 2021-10-16 (×2): 2 mg via INTRAVENOUS
  Filled 2021-10-16 (×2): qty 1

## 2021-10-16 MED ORDER — LORAZEPAM 2 MG/ML IJ SOLN
2.0000 mg | INTRAMUSCULAR | Status: DC | PRN
  Administered 2021-10-16: 4 mg via INTRAVENOUS
  Filled 2021-10-16: qty 2

## 2021-10-16 MED ORDER — OSMOLITE 1.2 CAL PO LIQD
1000.0000 mL | ORAL | Status: DC
  Administered 2021-10-16: 1000 mL
  Filled 2021-10-16 (×2): qty 1000

## 2021-10-16 MED ORDER — QUETIAPINE FUMARATE 50 MG PO TABS
50.0000 mg | ORAL_TABLET | Freq: Every day | ORAL | Status: DC
  Administered 2021-10-16: 50 mg

## 2021-10-16 MED ORDER — DIPHENHYDRAMINE HCL 50 MG/ML IJ SOLN: 25.0000 mg | INTRAMUSCULAR | Status: DC | PRN

## 2021-10-16 MED ORDER — ACETAMINOPHEN 650 MG RE SUPP: 650.0000 mg | Freq: Four times a day (QID) | RECTAL | Status: DC | PRN

## 2021-10-16 MED ORDER — GLYCOPYRROLATE 0.2 MG/ML IJ SOLN: 0.2000 mg | INTRAMUSCULAR | Status: DC | PRN

## 2021-10-16 MED ORDER — ACETAMINOPHEN 325 MG PO TABS: 650.0000 mg | ORAL_TABLET | Freq: Four times a day (QID) | ORAL | Status: DC | PRN

## 2021-10-16 MED ORDER — INSULIN GLARGINE-YFGN 100 UNIT/ML ~~LOC~~ SOLN
20.0000 [IU] | Freq: Two times a day (BID) | SUBCUTANEOUS | Status: DC
  Administered 2021-10-16: 20 [IU] via SUBCUTANEOUS
  Filled 2021-10-16 (×3): qty 0.2

## 2021-10-16 MED ORDER — POLYVINYL ALCOHOL 1.4 % OP SOLN
1.0000 [drp] | Freq: Four times a day (QID) | OPHTHALMIC | Status: DC | PRN
  Filled 2021-10-16: qty 15

## 2021-10-16 MED ORDER — QUETIAPINE FUMARATE 50 MG PO TABS: 50.0000 mg | ORAL_TABLET | Freq: Every day | ORAL | Status: DC

## 2021-10-16 MED ORDER — MORPHINE BOLUS VIA INFUSION
5.0000 mg | INTRAVENOUS | Status: DC | PRN
  Administered 2021-10-16: 5 mg via INTRAVENOUS
  Filled 2021-10-16: qty 5

## 2021-10-16 MED ORDER — DEXTROSE 5 % IV SOLN: INTRAVENOUS | Status: DC

## 2021-10-16 MED ORDER — MORPHINE 100MG IN NS 100ML (1MG/ML) PREMIX INFUSION
0.0000 mg/h | INTRAVENOUS | Status: DC
Start: 2021-10-16 — End: 2021-10-17
  Administered 2021-10-16: 10 mg/h via INTRAVENOUS
  Administered 2021-10-16: 8 mg/h via INTRAVENOUS
  Filled 2021-10-16: qty 100

## 2021-10-16 MED ORDER — SODIUM CHLORIDE 3 % IN NEBU
4.0000 mL | INHALATION_SOLUTION | Freq: Two times a day (BID) | RESPIRATORY_TRACT | Status: DC
  Administered 2021-10-16: 4 mL via RESPIRATORY_TRACT
  Filled 2021-10-16 (×2): qty 4

## 2021-10-17 NOTE — Progress Notes (Signed)
Patient died Nov 6th at 2350, declared by two RN's. Family at bedside during his passing including his wife, son and son's family. Wife Roxie Lelan Pons reported no belongings at bedside. RN offered to call chaplain to offer additional support, family declined. On call provider paged and made aware. Eye care provided per [facility] request for potential donation. No acute family concerns when they left the unit. Post-mortem care provided.

## 2021-10-17 NOTE — Progress Notes (Signed)
[facility] night cross cover note:  I was notified by RN that this patient, on comfort care measures only, passed away at 2350 on 10/26/21.  I have confirmed existing order that 2 RNs may pronounce.      Babs Bertin, DO Hospitalist

## 2021-10-17 NOTE — Progress Notes (Signed)
Armanda Heritage RN and Montel Clock RN wasted 40 mL of the Morphine drip in the stericycle bin. Tubing disposed of in proper receptacle.

## 2021-10-21 LAB — CULTURE, BLOOD (ROUTINE X 2)
Culture: NO GROWTH
Culture: NO GROWTH
Special Requests: ADEQUATE

## 2021-10-26 NOTE — Discharge Summary (Addendum)
Death Summary  Warren Taaffe Cavell Sr. XHB:[id number] DOB: 15-Nov-1951 DOA: 2021-10-22  PCP: [name], MD  Admit date: 10/22/21 Date of Death: 10-Nov-2021 Time of Death: 01-Nov-2021 1:26am Notification: [name], MD notified of death of 11-10-2021   History of present illness:  Warren Lacroix Lomax Sr. is a 70 y.o. male with a history of male past medical history of CAD status post stent x2 to the RCA, essential hypertension, diabetes mellitus type 2 prostate cancer who comes into the ED on Oct 22, 2021 found to have an NSTEMI and respiratory distress and intubated.  At [facility] was taken to the Cath Lab found distal LM occlusion, LAD and left circumflex, underwent Impella supported PCI with DES placement to the mid LAD transferred to the [facility] under PCCM due to acute respiratory failure.Impella placed in Cath Lab with cardiogenic shock, discontinue on 10/10/2021.  Final Diagnoses:  Acute respiratory failure with hypoxia required mechanical ventilation/acute pulmonary edema/cardiogenic shock due to biventricular failure requiring milrinone and Impella support device for acute systolic heart failure: Sepsis has been ruled out Initially intubated eventually extubated and completed 7 days of antibiotic core track was placed in [place] device had to be placed due to his heart failure which is eventually removed and the heart failure team was consulted and placed on milrinone and diuresis. The patient continues to be weak. Physical therapy evaluated the patient and the recommended LTAC versus inpatient rehab.   Critical care was consulted as he continued to deteriorate they spoke with the daughter and wife which were at bedside and they related they did not want him to him to be intubated and they would like to transition to comfort measures. All were discontinued he was started on a morphine drip and the patient passed away on Nov 01, 2021 at 1 AM in the morning.    The  results of significant diagnostics from this hospitalization (including imaging, microbiology, ancillary and laboratory) are listed below for reference.    Significant Diagnostic Studies: DG Chest 1 View  Result Date: 10/11/2021 CLINICAL DATA:  70 year old male status post placement of orogastric tube. EXAM: CHEST  1 VIEW COMPARISON:  Chest x-ray 10/10/2020. FINDINGS: An endotracheal tube is in place with tip 3.5 cm above the carina. A nasogastric tube is seen extending into the stomach, however, the tip of the nasogastric tube extends below the lower margin of the image. Catheter extending from the abdomen into the right atrium, with tip projecting over the mid to lower right hemithorax medially, presumably within a right-sided pulmonary arterial branch (although it is difficult to exclude the possibility of right atrial placement). Previously noted thoracic aortic Impella is not identified on today's examination. Lung volumes are low. Bibasilar opacities (left greater than right), may reflect areas of atelectasis and/or consolidation. Small left pleural effusion. No definite right pleural effusion. No pneumothorax. Mild crowding of the pulmonary vasculature, without frank pulmonary edema. Heart size is mildly enlarged. Upper mediastinal contours are within normal limits. Atherosclerotic calcifications in the thoracic aorta. IMPRESSION: 1. Support apparatus, as above. 2. Low lung volumes with bibasilar (left greater than right) opacities which may reflect areas of atelectasis and/or consolidation. 3. Small left pleural effusion. 4. Aortic atherosclerosis. Electronically Signed   By: Vinnie Langton M.D.   On: 10/11/2021 06:57   DG Chest 1 View  Result Date: 10/08/2021 CLINICAL DATA:  Intubation EXAM: CHEST  1 VIEW COMPARISON:  10-22-2021 FINDINGS: Endotracheal tube terminates 4.5 cm above the carina. Cardiomegaly. Very mild perihilar edema is possible. No definite  pleural effusions. No pleural effusion  or pneumothorax. Suspected inferior post Swan-Ganz catheter terminating in the right main pulmonary artery. Defibrillator pad overlying the left hemithorax. Enteric tube courses into the stomach. IMPRESSION: Endotracheal tube terminates 4.5 cm above the carina. Possible mild perihilar edema. Additional support apparatus as above. Electronically Signed   By: Julian Hy M.D.   On: 10/08/2021 03:50   DG Chest 2 View  Result Date: 10/02/2021 CLINICAL DATA:  Chest pain EXAM: CHEST - 2 VIEW COMPARISON:  04/24/2009 FINDINGS: The heart size and mediastinal contours are within normal limits. Diffuse bilateral interstitial pulmonary opacity. The visualized skeletal structures are unremarkable. IMPRESSION: Diffuse bilateral interstitial pulmonary opacity, consistent with edema or infection. No focal airspace opacity. Electronically Signed   By: Delanna Ahmadi M.D.   On: 09/11/2021 07:25   CARDIAC CATHETERIZATION  Addendum Date: 10/19/2021   Conclusion: Severe two-vessel coronary artery disease with 75% distal LMCA stenosis, as well as occlusions of proximal LAD and ostial LCx.  Mild-moderate disease also noted in ramus intermedius and RCA/PDA. Severely reduced LVEF and severely elevated left ventricular filling pressure in the setting of cardiogenic shock and acute decompensated heart failure. Severely elevated right heart and pulmonary artery pressures. Mildly reduced Fick cardiac output/index with Impella CP in place (suspect cardiac output would be severely reduced without hemodynamic support). Marked respiratory distress prior to PCI and Impella insertion requiring emergent intubation. Successful Impella supported PCI to distal LMCA through mid LAD using Onyx Frontier 2.75 x 38 mm drug-eluting stent with 0% residual stenosis and TIMI-3 flow. Recommendations: Continue dual antiplatelet therapy with aspirin and ticagrelor for at least 12 months. Discontinue cangrelor infusion 2 hours after ticagrelor load was  given. Transfer to [name] for ongoing management/weaning of pressors and Impella per advanced heart failure service. Aggressive secondary prevention of coronary artery disease. Nelva Bush, MD [facility]  Addendum Date: 09/10/2021   Conclusion: Severe two-vessel coronary artery disease with 75% distal LMCA stenosis, as well as occlusions of proximal LAD and ostial LCx.  Mild-moderate disease also noted in ramus intermedius and RCA/PDA. Severely reduced LVEF and severely elevated left ventricular filling pressure in the setting of cardiogenic shock and acute decompensated heart failure. Severely elevated right heart and pulmonary artery pressures. Mildly reduced Fick cardiac output/index with Impella CP in place (suspect cardiac output would be severely reduced without hemodynamic support). Marked respiratory distress prior to PCI and Impella insertion requiring emergent intubation. Successful Impella supported PCI to distal LMCA through mid LAD using Onyx Frontier 2.75 x 38 mm drug-eluting stent with 0% residual stenosis and TIMI-3 flow. Recommendations: Continue dual antiplatelet therapy with aspirin and ticagrelor for at least 12 months. Discontinue cangrelor infusion 2 hours after ticagrelor load was given. Transfer to [name] for ongoing management/weaning of pressors and Impella per advanced heart failure service. Aggressive secondary prevention of coronary artery disease. Nelva Bush, MD [facility]  Result Date: 10/19/2021 Conclusion: Severe two-vessel coronary artery disease with 75% distal LMCA stenosis, as well as occlusions of proximal LAD and ostial LCx.  Mild-moderate disease also noted in ramus intermedius and RCA/PDA. Severely reduced LVEF and severely elevated left ventricular filling pressure. Marked respiratory distress prior to PCI and Impella insertion requiring emergent intubation. Successful Impella supported PCI to distal LMCA through mid LAD using Onyx Frontier 2.75  x 38 mm drug-eluting stent with 0% residual stenosis and TIMI-3 flow. Recommendations: Continue dual antiplatelet therapy with aspirin and ticagrelor for at least 12 months. Discontinue cangrelor infusion 2 hours after ticagrelor  load was given. Transfer to [name] for ongoing management/weaning of pressors and Impella per advanced heart failure service. Aggressive secondary prevention of coronary artery disease. Nelva Bush, MD [facility]  DG CHEST PORT 1 VIEW  Result Date: 11/04/21 CLINICAL DATA:  Chest pain.  Shortness of breath. EXAM: PORTABLE CHEST 1 VIEW COMPARISON:  October 15, 2021 FINDINGS: A feeding tube terminates below today's film. The history indicates the patient is intubated. However, no ET tube is identified. No pneumothorax. A right PICC line is again seen. No nodules or masses. No focal infiltrates. No change in the cardiomediastinal silhouette. IMPRESSION: 1. The history indicates the patient is intubated. However, no ET tube is identified. Recommend clinical correlation. 2. The feeding tube terminates below today's film. Persistent right PICC line. 3. No other changes. These results will be called to the ordering clinician or representative by the Radiologist Assistant, and communication documented in the PACS or [employer]. Electronically Signed   By: Dorise Bullion III M.D.   On: Nov 04, 2021 18:13   DG CHEST PORT 1 VIEW  Result Date: 10/15/2021 CLINICAL DATA:  Dyspnea EXAM: PORTABLE CHEST 1 VIEW COMPARISON:  10/11/2021 FINDINGS: Endotracheal tube removed. Swan-Ganz catheter removed. Feeding tube has been placed with the tip not visualized. Lungs are clear without infiltrate effusion or edema. Mild cardiac enlargement. IMPRESSION: Improved aeration of the lungs.  Lungs are now clear. Electronically Signed   By: Franchot Gallo M.D.   On: 10/15/2021 12:08   DG CHEST PORT 1 VIEW  Result Date: 10/10/2021 CLINICAL DATA:  Ventilator support.  Follow-up. EXAM: PORTABLE  CHEST 1 VIEW COMPARISON:  10/09/2021 FINDINGS: Endotracheal tube tip 3 cm above the carina. Orogastric or nasogastric tube in place, but the location cannot be commonly established because of underpenetration. Swan-Ganz catheter tip in the main pulmonary artery or proximal right pulmonary artery. Bilateral lower lobe volume loss and pleural effusions persist. Findings are similar to yesterday, possibly minimally improved. IMPRESSION: Persistent effusions and basilar atelectasis. Possibly slightly improved. No worsening or new findings. Exact location of the nasogastric tube is uncertain because of underpenetration. Electronically Signed   By: Nelson Chimes M.D.   On: 10/10/2021 08:49   DG CHEST PORT 1 VIEW  Result Date: 10/09/2021 CLINICAL DATA:  Respiratory failure. EXAM: PORTABLE CHEST 1 VIEW COMPARISON:  10/08/2021 and prior studies FINDINGS: An endotracheal tube with tip 3.9 cm above the carina, NG tube entering the stomach with tip off field of view and Impella device again noted. Pulmonary vascular congestion with mild interstitial pulmonary edema again noted. Slightly increasing bibasilar atelectasis/opacities noted with probable small bilateral pleural effusions. There is no evidence of pneumothorax. IMPRESSION: Interstitial pulmonary edema again noted with slightly increasing bibasilar atelectasis/opacities with probable small bilateral pleural effusions. Electronically Signed   By: Margarette Canada M.D.   On: 10/09/2021 12:17   DG Abd Portable 1V  Result Date: 10/12/2021 CLINICAL DATA:  Tube placement EXAM: PORTABLE ABDOMEN - 1 VIEW COMPARISON:  Abdominal x-ray 10/11/2021 FINDINGS: Enteric tube tip is in the stomach slightly right of midline. No gaseous distended loops of bowel identified. Surgical clips in the right upper quadrant. IMPRESSION: Enteric tube tip in the stomach. Electronically Signed   By: Ofilia Neas M.D.   On: 10/12/2021 10:29   DG Abd Portable 1V  Result Date:  10/11/2021 CLINICAL DATA:  Nasal/orogastric tube placement. EXAM: PORTABLE ABDOMEN - 1 VIEW COMPARISON:  10/06/2010.  CT, 03/01/2015.  Current chest radiograph. FINDINGS: Nasal/orogastric tube has been placed, extending well below  the diaphragm, curling in mid stomach. Lung base opacities are similar to the earlier study consistent with atelectasis possibly due to infection. Suspect small effusions. IMPRESSION: Well-positioned nasal/orogastric tube. Electronically Signed   By: Lajean Manes M.D.   On: 10/11/2021 19:20   DG Swallowing Func-Speech Pathology  Result Date: 10/13/2021 Table formatting from the original result was not included. Objective Swallowing Evaluation: Type of Study: MBS-Modified Barium Swallow Study  Patient Details Name: Colsen Modi Hadaway Sr. MRN: [id number] Date of Birth: 03/31/1951 Today's Date: 10/13/2021 Time: SLP Start Time (ACUTE ONLY): 1300 -SLP Stop Time (ACUTE ONLY): 1320 SLP Time Calculation (min) (ACUTE ONLY): 20 min Past Medical History: Past Medical History: Diagnosis Date  Arteriosclerosis of coronary artery 10/29/2015  Overview:  with mi   Arthritis   BPH (benign prostatic hyperplasia)   Diabetes mellitus without complication ([place])   Gout   Heart attack ([place]) 1996  Hypertension   Prostate cancer ([place])   Stroke [facility])  Past Surgical History: Past Surgical History: Procedure Laterality Date  CHOLECYSTECTOMY    COLONOSCOPY N/A 05/01/2018  Procedure: COLONOSCOPY;  Surgeon: Virgel Manifold, MD;  Location: [facility] ENDOSCOPY;  Service: Endoscopy;  Laterality: N/A;  CORONARY STENT INTERVENTION W/IMPELLA N/A 09/11/2021  Procedure: Coronary Stent Intervention w/Impella;  Surgeon: Nelva Bush, MD;  Location: [place] CV LAB;  Service: Cardiovascular;  Laterality: N/A;  ESOPHAGOGASTRODUODENOSCOPY N/A 05/01/2018  Procedure: ESOPHAGOGASTRODUODENOSCOPY (EGD);  Surgeon: Virgel Manifold, MD;  Location: [facility] ENDOSCOPY;  Service: Endoscopy;  Laterality: N/A;  RIGHT HEART  CATHETERIZATION WITH ADENOSINE STUDY  1996  RIGHT/LEFT HEART CATH AND CORONARY ANGIOGRAPHY N/A 09/11/2021  Procedure: RIGHT/LEFT HEART CATH AND CORONARY ANGIOGRAPHY;  Surgeon: Nelva Bush, MD;  Location: [place] CV LAB;  Service: Cardiovascular;  Laterality: N/A;  VENTRICULAR ASSIST DEVICE INSERTION N/A 09/25/2021  Procedure: VENTRICULAR ASSIST DEVICE INSERTION;  Surgeon: Nelva Bush, MD;  Location: [place] CV LAB;  Service: Cardiovascular;  Laterality: N/A; HPI: 70 year old man who remains critically ill due to acute hypoxic and hypercarbic respiratory failure require mechanical ventilation as well as acute decompensated systolic heart failure requiring inotropic and vasopressor support. Intubated 10/28-11/1. NSTEMI  No data recorded Assessment / Plan / Recommendation CHL IP CLINICAL IMPRESSIONS 10/13/2021 Clinical Impression Pt presents with pharyngeal dysphagia characterized by reduced base of tongue retraction, a pharyngeal delay, reduced hyolaryngeal elevation, and reduced anterior laryngeal movement. He demonstrated incomplete epiglottic inversion, vallecular residue, and penetration (PAS 3, 5) with thin liquids via cup and straw. Prompted coughing was effective in expelling penetrated material. Aspiration was not definitively seen while the fluoro was on, but coughing was noted between swallows after an episode of penetration (PAS 5) and aspiration of penetrate is suspected. A chin tuck posture with slight left head turn improved penetration to PAS 2,3 but did not eliminate it. Amount of vallecular residue increased with bolus size and with advancement of bolus consistency. Residue was eliminated with multiple liquid washes, but thin liquids was more effective. The barium tablet (given with nectar thick liquids) became lodged in the valleculae, and it was resistant to movement with additional boluses of nectar thick liquids. Transport was facilitated with two boluses of puree, but  movement was then halted again at the level of pyriform sinuses. Multiple additional boluses of nectar thick liquids were necessary to transport the bolus the the cervical esophagus. Esophageal stasis was noted throughout the middle and lower thoracic esophagus with retrograde flow to the upper thoracic esophagus; esophageal assessment would be necessary to determine the etiology of this. A  dysphagia 3 diet with nectar thick liquids will be initiated at this time with observance of swallowing precautions. SLP will follow to assess tolerance and for diet advancement as clinically indicated. SLP Visit Diagnosis Dysphagia, pharyngeal phase (R13.13) Attention and concentration deficit following -- Frontal lobe and executive function deficit following -- Impact on safety and function Mild aspiration risk   CHL IP TREATMENT RECOMMENDATION 10/13/2021 Treatment Recommendations Therapy as outlined in treatment plan below   Prognosis 10/13/2021 Prognosis for Safe Diet Advancement Good Barriers to Reach Goals Cognitive deficits Barriers/Prognosis Comment -- CHL IP DIET RECOMMENDATION 10/13/2021 SLP Diet Recommendations Dysphagia 3 (Mech soft) solids;Nectar thick liquid Liquid Administration via Cup;Straw Medication Administration Crushed with puree Compensations Slow rate;Small sips/bites;Follow solids with liquid;Effortful swallow Postural Changes Seated upright at 90 degrees   CHL IP OTHER RECOMMENDATIONS 10/13/2021 Recommended Consults -- Oral Care Recommendations -- Other Recommendations Order thickener from pharmacy   CHL IP FOLLOW UP RECOMMENDATIONS 10/13/2021 Follow up Recommendations None   CHL IP FREQUENCY AND DURATION 10/13/2021 Speech Therapy Frequency (ACUTE ONLY) min 2x/week Treatment Duration 2 weeks      CHL IP ORAL PHASE 10/13/2021 Oral Phase WFL Oral - Pudding Teaspoon -- Oral - Pudding Cup -- Oral - Honey Teaspoon -- Oral - Honey Cup -- Oral - Nectar Teaspoon -- Oral - Nectar Cup -- Oral - Nectar Straw -- Oral -  Thin Teaspoon -- Oral - Thin Cup -- Oral - Thin Straw -- Oral - Puree -- Oral - Mech Soft -- Oral - Regular -- Oral - Multi-Consistency -- Oral - Pill -- Oral Phase - Comment --  CHL IP PHARYNGEAL PHASE 10/13/2021 Pharyngeal Phase Impaired Pharyngeal- Pudding Teaspoon -- Pharyngeal -- Pharyngeal- Pudding Cup -- Pharyngeal -- Pharyngeal- Honey Teaspoon -- Pharyngeal -- Pharyngeal- Honey Cup -- Pharyngeal -- Pharyngeal- Nectar Teaspoon -- Pharyngeal -- Pharyngeal- Nectar Cup Reduced anterior laryngeal mobility;Reduced laryngeal elevation;Reduced tongue base retraction;Reduced epiglottic inversion;Pharyngeal residue - valleculae Pharyngeal -- Pharyngeal- Nectar Straw Reduced anterior laryngeal mobility;Reduced laryngeal elevation;Reduced tongue base retraction;Reduced epiglottic inversion;Pharyngeal residue - valleculae Pharyngeal -- Pharyngeal- Thin Teaspoon -- Pharyngeal -- Pharyngeal- Thin Cup Reduced anterior laryngeal mobility;Reduced laryngeal elevation;Reduced tongue base retraction;Reduced epiglottic inversion;Pharyngeal residue - valleculae;Penetration/Aspiration during swallow;Penetration/Apiration after swallow Pharyngeal Material enters airway, remains ABOVE vocal cords and not ejected out;Material enters airway, CONTACTS cords and not ejected out Pharyngeal- Thin Straw Reduced anterior laryngeal mobility;Reduced laryngeal elevation;Reduced tongue base retraction;Reduced epiglottic inversion;Pharyngeal residue - valleculae;Penetration/Aspiration during swallow;Penetration/Apiration after swallow Pharyngeal Material enters airway, remains ABOVE vocal cords and not ejected out;Material enters airway, CONTACTS cords and not ejected out Pharyngeal- Puree Reduced anterior laryngeal mobility;Reduced laryngeal elevation;Reduced tongue base retraction;Reduced epiglottic inversion;Pharyngeal residue - valleculae Pharyngeal -- Pharyngeal- Mechanical Soft -- Pharyngeal -- Pharyngeal- Regular Reduced anterior laryngeal  mobility;Reduced laryngeal elevation;Reduced tongue base retraction;Reduced epiglottic inversion;Pharyngeal residue - valleculae Pharyngeal -- Pharyngeal- Multi-consistency -- Pharyngeal -- Pharyngeal- Pill Reduced anterior laryngeal mobility;Reduced laryngeal elevation;Reduced tongue base retraction;Reduced epiglottic inversion;Pharyngeal residue - valleculae Pharyngeal -- Pharyngeal Comment --  CHL IP CERVICAL ESOPHAGEAL PHASE 10/13/2021 Cervical Esophageal Phase Impaired Pudding Teaspoon -- Pudding Cup -- Honey Teaspoon -- Honey Cup -- Nectar Teaspoon -- Nectar Cup -- Nectar Straw (No Data) Thin Teaspoon -- Thin Cup -- Thin Straw -- Puree -- Mechanical Soft -- Regular -- Multi-consistency -- Pill (No Data) Cervical Esophageal Comment See impressions Shanika I. Hardin Negus, [place], [place] Office number [phone] Pager [place] 10/13/2021, 2:51 PM  ECHOCARDIOGRAM COMPLETE  Result Date: 10/08/2021    ECHOCARDIOGRAM REPORT   Patient Name:   Warren Hangartner Devins Sr. Date of Exam: 10/08/2021 Medical Rec #:  [id number]                Height:       73.0 in Accession #:    [id number]               Weight:       248.0 lb Date of Birth:  1951-09-24                 BSA:          2.358 m Patient Age:    19 years                 BP:           108/68 mmHg Patient Gender: M                        HR:           101 bpm. Exam Location:  Inpatient Procedure: 2D Echo, Cardiac Doppler and Color Doppler Indications:    CHF- Acute Systolic V76.16  History:        Patient has no prior history of Echocardiogram examinations.                 Previous Myocardial Infarction, Stroke; Risk                 Factors:Hypertension, Diabetes and Dyslipidemia. 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  Sonographer:    Alvino Chapel RCS Referring Phys: [phone] Wilmer Floor SIMMONS   Sonographer Comments: Difficult PLAX windows. Had another echo tech come scan also. Images at the end of study. IMPRESSIONS  1. Left ventricular ejection fraction, by estimation, is 20 to 25%. The left ventricle has severely decreased function. The left ventricle demonstrates regional wall motion abnormalities (suggestive of large LAD infarct with associated akinesis), wall motion is largel from the base only. There is mild left ventricular hypertrophy. There is no evidence of LV infarct. The Impella support device is not well visualized in relationship to the aortic annulus, but inlet is measured at 3.2 mm below the aortic  annulus slightly supeior that ideal  2. Right ventricular systolic function is normal. The right ventricular size is normal. Tricuspid regurgitation signal is inadequate for assessing PA pressure.  3. Left atrial size was mildly dilated.  4. A small pericardial effusion is present.  5. The mitral valve is normal in structure. No evidence of mitral valve regurgitation. No evidence of mitral stenosis.  6. The aortic valve was not well visualized. Aortic valve regurgitation is mild.  7. The inferior vena cava is dilated in size with >50% respiratory variability, suggesting right atrial pressure of 8 mmHg. Conclusion(s)/Recommendation(s): No increase in pressors requirements and interval decrease to from P8 to P7, will not advance device. Will notify primary AHF MD. FINDINGS  Left Ventricle: Left ventricular ejection fraction, by estimation, is 20 to 25%. The left ventricle has severely decreased function. The left ventricle demonstrates regional wall motion abnormalities. The left ventricular internal cavity size was normal  in size. There is mild left ventricular hypertrophy.  Left ventricular diastolic parameters are indeterminate. Right Ventricle: The right ventricular size is normal. No increase in right ventricular wall thickness. Right ventricular systolic function is normal. Tricuspid  regurgitation signal is inadequate for assessing PA pressure. Left Atrium: Left atrial size was mildly dilated. Right Atrium: Right atrial size was normal in size. Pericardium: A small pericardial effusion is present. Mitral Valve: The mitral valve is normal in structure. No evidence of mitral valve regurgitation. No evidence of mitral valve stenosis. Tricuspid Valve: The tricuspid valve is normal in structure. Tricuspid valve regurgitation is not demonstrated. Aortic Valve: The aortic valve was not well visualized. Aortic valve regurgitation is mild. Pulmonic Valve: The pulmonic valve was not well visualized. Pulmonic valve regurgitation is not visualized. No evidence of pulmonic stenosis. Aorta: The aortic root is normal in size and structure. Venous: The inferior vena cava is dilated in size with greater than 50% respiratory variability, suggesting right atrial pressure of 8 mmHg. IAS/Shunts: The atrial septum is grossly normal. Additional Comments: A venous catheter is visualized.  LEFT VENTRICLE PLAX 2D LVIDd:         5.00 cm LVIDs:         3.00 cm LV PW:         1.10 cm LV IVS:        1.10 cm LVOT diam:     2.40 cm LV SV:         57 LV SV Index:   24 LVOT Area:     4.52 cm  LV Volumes (MOD) LV vol d, MOD A2C: 124.0 ml LV vol d, MOD A4C: 143.0 ml LV vol s, MOD A2C: 84.1 ml LV vol s, MOD A4C: 69.9 ml LV SV MOD A2C:     39.9 ml LV SV MOD A4C:     143.0 ml LV SV MOD BP:      57.5 ml RIGHT VENTRICLE RV S prime:     15.30 cm/s TAPSE (M-mode): 2.4 cm LEFT ATRIUM             Index        RIGHT ATRIUM           Index LA diam:        4.00 cm 1.70 cm/m   RA Area:     14.60 cm LA Vol (A2C):   62.2 ml 26.38 ml/m  RA Volume:   33.80 ml  14.34 ml/m LA Vol (A4C):   84.6 ml 35.88 ml/m LA Biplane Vol: 76.8 ml 32.57 ml/m  AORTIC VALVE LVOT Vmax:   81.60 cm/s LVOT Vmean:  58.900 cm/s LVOT VTI:    0.125 m  AORTA Ao Root diam: 3.05 cm MITRAL VALVE MV Area (PHT): 4.49 cm    SHUNTS MV Decel Time: 169 msec    Systemic VTI:   0.12 m MV E velocity: 79.05 cm/s  Systemic Diam: 2.40 cm Rudean Haskell MD Electronically signed by Rudean Haskell MD Signature Date/Time: 10/08/2021/3:13:40 PM    Final    ECHOCARDIOGRAM LIMITED  Result Date: 10/09/2021    ECHOCARDIOGRAM LIMITED REPORT   Patient Name:   Warren Celestine Arpin Sr. Date of Exam: 10/09/2021 Medical Rec #:  [id number]                Height:       73.0 in Accession #:    [id number]               Weight:       249.6 lb Date of  Birth:  December 01, 1951                 BSA:          2.364 m Patient Age:    72 years                 BP:           111/68 mmHg Patient Gender: M                        HR:           105 bpm. Exam Location:  Inpatient Procedure: Limited Echo STAT ECHO Indications:    Impella placement  History:        Patient has prior history of Echocardiogram examinations, most                 recent 10/08/2021. Previous Myocardial Infarction and CAD,                 Stroke; Risk Factors:Hypertension, Diabetes and Dyslipidemia.  Sonographer:    Merrie Roof RDCS Referring Phys: [place]  1. Limited study to assess impella placement; impella 3-3.4 cm from aortic valve. FINDINGS  Pericardium: There is no evidence of pericardial effusion. Additional Comments: Limited study to assess impella placement; impella 3-3.4 cm from aortic valve. A venous catheter is visualized. Kirk Ruths MD Electronically signed by Kirk Ruths MD Signature Date/Time: 10/09/2021/11:16:59 AM    Final    [place] EKG SITE RITE  Result Date: 10/11/2021 If Site Rite image not attached, placement could not be confirmed due to current cardiac rhythm.   Microbiology: No results found for this or any previous visit (from the past 240 hour(s)).   Labs: Basic Metabolic Panel: No results for input(s): NA, K, CL, CO2, GLUCOSE, BUN, CREATININE, CALCIUM, MG, PHOS in the last 168 hours. Liver Function Tests: No results for input(s): AST, ALT, ALKPHOS, BILITOT, PROT, ALBUMIN in  the last 168 hours. No results for input(s): LIPASE, AMYLASE in the last 168 hours. No results for input(s): AMMONIA in the last 168 hours. CBC: No results for input(s): WBC, NEUTROABS, HGB, HCT, MCV, PLT in the last 168 hours. Cardiac Enzymes: No results for input(s): CKTOTAL, CKMB, CKMBINDEX, TROPONINI in the last 168 hours. D-Dimer No results for input(s): DDIMER in the last 72 hours. BNP: Invalid input(s): POCBNP CBG: No results for input(s): GLUCAP in the last 168 hours. Anemia work up No results for input(s): VITAMINB12, FOLATE, FERRITIN, TIBC, IRON, RETICCTPCT in the last 72 hours. Urinalysis    Component Value Date/Time   COLORURINE YELLOW October 25, 2021 1107   APPEARANCEUR CLEAR 25-Oct-2021 1107   APPEARANCEUR Clear 11/25/2015 1043   LABSPEC 1.017 October 25, 2021 1107   PHURINE 6.0 October 25, 2021 1107   GLUCOSEU 50 (A) 10-25-21 1107   HGBUR SMALL (A) 10/25/2021 1107   BILIRUBINUR NEGATIVE 10/25/2021 1107   BILIRUBINUR Negative 11/25/2015 [place] Oct 25, 2021 1107   PROTEINUR NEGATIVE Oct 25, 2021 1107   NITRITE NEGATIVE 10/25/21 1107   LEUKOCYTESUR NEGATIVE 10-25-21 1107   Sepsis Labs Invalid input(s): PROCALCITONIN,  WBC,  LACTICIDVEN     SIGNED:  Charlynne Cousins, MD  [facility] 10/26/2021, 1:12 PM Pager   If 7PM-7AM, please contact night-coverage [email or web address] Password [email or web address]

## 2021-11-10 NOTE — Progress Notes (Signed)
Chaplain responded to RN request to provide comfort and spiritual care to family following DNR and PC decision.  Chaplain is very Hydrographic surveyor Patrick's referral.  Provided active listening, hospitality, prayer and emotional support.  Family asked if chaplains would be here through the night, so I will code this visit for a follow-up.  Please call us if our support is needed before that F/U.  Luana Shu [phone]     10/31/21 2000  Clinical Encounter Type  Visited With Patient and family together  Visit Type Initial;Spiritual support;Patient actively dying  Referral From Nurse  Spiritual Encounters  Spiritual Needs Prayer  Stress Factors  Patient Stress Factors Major life changes  Family Stress Factors Loss of control;Major life changes

## 2021-11-10 NOTE — Progress Notes (Signed)
Daily Progress Note   Patient Name: Warren Calzadilla Arif Sr.       Date: 2021/11/05 DOB: 09/07/1951  Age: 70 y.o. MRN#: [id number] Attending Physician: Charlynne Cousins, MD Primary Care Physician: [name], MD Admit Date: 09/12/2021 Length of Stay: 9 days  Reason for Consultation/Follow-up: Establishing goals of care  HPI/Patient Profile:  70 y.o. male  with past medical history of CAD status post stent x2 to the RCA, essential hypertension, diabetes mellitus type 2, prostate cancer who presented to the ED with shortness of breath improved with nitro. He was admitted on 09/12/2021 with NSTEMI and respiratory distress.    Taken to the cath lab where he was intubated for respiratory distress, underwent PCI with DES LM to LAD. Impella was placed for cardiogenic shock. Since then he was extubated (10/31) and impella removed (11/1). He is now off milrinone and being followed by the advance heart failure team. EG 20-25% (EF 06/07/21 at [facility] was >55%). Previously followed by [facility].   Weak, debilitated. Dys 2 diet after bedside swallow eval. CoreTrac in place. Evaluating for SNF vs LTAC.   PMT was consulted for [place].  Subjective:   Subjective: Chart Reviewed. Updates received. Patient Assessed. Created space and opportunity for patient  and family to explore thoughts and feelings regarding current medical situation.  Today's Discussion: I saw the patient at the bedside. He seemed quite short of breath, difficulty in completing full sentences. States he is short of breath. Did not want to participate in further [place] discussions today. Goals are clear as of palliative discussion yesterday.  Notified the nurse, Dr. Olevia Bowens (hospitalist) and Dr. Haroldine Laws (advanced heart failure).  Review of Systems  Constitutional:  Positive for activity change, appetite change and fatigue.  Respiratory:  Positive for shortness of breath.   Gastrointestinal:  Negative for abdominal pain, nausea  and vomiting.   Objective:   Vital Signs:  BP 135/70 (BP Location: Left Arm)   Pulse 88   Temp 99.1 F (37.3 C) (Oral)   Resp (!) 28   Ht 6\' 1"  (1.854 m)   Wt 105.1 kg   SpO2 96%   BMI 30.57 kg/m   Physical Exam: Physical Exam Vitals and nursing note reviewed.  Constitutional:      Appearance: He is obese. He is ill-appearing.  HENT:     Head: Normocephalic and atraumatic.  Cardiovascular:     Rate and Rhythm: Normal rate.  Pulmonary:     Effort: Tachypnea and respiratory distress present.     Breath sounds: Rhonchi present. No wheezing.  Abdominal:     General: Abdomen is flat.     Palpations: Abdomen is soft.  Skin:    General: Skin is warm and dry.  Neurological:     General: No focal deficit present.     Mental Status: He is alert.    Palliative Assessment/Data: 50%   Assessment & Plan:   Impression: Present on Admission:  Cardiogenic shock ([place])  NSTEMI (non-ST elevated myocardial infarction) ([place])  Essential (primary) hypertension  70 year old male who presents status post NSTEMI with cardiogenic shock.  Fortunately he has made some good progress and his Impella device was discontinued, he was extubated.  He is not profoundly weak and with dysphagia limiting his oral intake.  He subsequently has a core track.  However, they are trying to encourage oral intake so they can pull a core track.  Long-term prognosis is questionable/guarded.  A lot will depend on whether he is able to  recover any heart function as well as his ability to have adequate oral intake and strengthen with rehab. Co-Ox trending down (63% -> 53% -> 45%) and HF team following.  SUMMARY OF RECOMMENDATIONS   Remain DNR Continue full scope of care otherwise Repeatedly encourage oral intake and therapy Monitor for changes/improvement in order to narrow down discharge disposition (SNF versus LTAC) Monitor closely given aspiration risk, climbing WBC count, intermittent dyspnea PMT will  continue to follow  Code Status: DNR  Prognosis: Unable to determine  Discharge Planning: To Be Determined  Discussed with: Patient, nursing team, medical team  Thank you for allowing [place] to participate in the care of Kayveon Lennartz Pullara Sr. PMT will continue to support holistically.  Time Total: 35 min  Visit consisted of counseling and education dealing with the complex and emotionally intense issues of symptom management and palliative care in the setting of serious and potentially life-threatening illness. Greater than 50%  of this time was spent counseling and coordinating care related to the above assessment and plan.  Walden Field, NP Palliative Medicine Team  Team Phone # [phone] (Nights/Weekends)  08/09/2021, 8:17 AM

## 2021-11-10 NOTE — Progress Notes (Signed)
Patient ID: Warren Garate Gowen Sr., male   DOB: [birth year], 70 y.o.   MRN: [id number]     Advanced Heart Failure Rounding Note  PCP-Cardiologist: None   Subjective:   10/31: Impella pulled 11/01: Extubated  Off milrinone co-ox dropped to 53% -> 46% this am. Recheck 59%. WBC 11-> 15k CVP 3  Was very stridorous yesterday and I was worried about aspiration but CXR clear yesterday.   Continues with tachypnea and stridor. Seems very anxious. Says he feels like he is having panic.   Met with Palliative Care and is now DNR/DNI but otherwise wants to pursue aggressive care.    Objective:   Weight Range: 105.1 kg Body mass index is 30.57 kg/m.   Vital Signs:   Temp:  [97.9 F (36.6 C)-99.1 F (37.3 C)] 99.1 F (37.3 C) (11/06 0805) Pulse Rate:  [72-88] 88 (11/06 0743) Resp:  [20-30] 28 (11/06 0805) BP: (117-142)/(58-71) 135/70 (11/06 0805) SpO2:  [95 %-100 %] 96 % (11/06 0450) FiO2 (%):  [98 %] 98 % (11/06 0743) Weight:  [105.1 kg] 105.1 kg (11/06 0527) Last BM Date: 10/13/21  Weight change: Filed Weights   10/14/21 0600 10/15/21 0636 03-Nov-2021 0527  Weight: 108.7 kg 106.2 kg 105.1 kg    Intake/Output:   Intake/Output Summary (Last 24 hours) at 11-03-2021 1109 Last data filed at 11-03-2021 0422 Gross per 24 hour  Intake 1840 ml  Output 1050 ml  Net 790 ml       Physical Exam   General:  Sitting up in bed tachypneic and stridorous .  HEENT: normal Neck: supple. no JVD. Carotids 2+ bilat; no bruits. No lymphadenopathy or thryomegaly appreciated. Cor: PMI nondisplaced. Regular rate & rhythm. No rubs, gallops or murmurs. Lungs: tachypneic and stridorous .  Abdomen: soft, nontender, nondistended. No hepatosplenomegaly. No bruits or masses. Good bowel sounds. Extremities: no cyanosis, clubbing, rash, edema Neuro: alert & orientedx3, cranial nerves grossly intact. moves all 4 extremities w/o difficulty. Affect pleasant  Telemetry   SR 80-90s Personally  reviewed  Labs    CBC Recent Labs    10/15/21 0443 11-03-21 0429  WBC 11.1* 15.1*  HGB 11.3* 12.7*  HCT 36.3* 40.0  MCV 85.0 84.2  PLT 178 413    Basic Metabolic Panel Recent Labs    10/15/21 0443 November 03, 2021 0429  NA 142 141  K 4.7 4.8  CL 110 107  CO2 24 26  GLUCOSE 204* 257*  BUN 40* 44*  CREATININE 1.21 1.22  CALCIUM 9.8 9.8  MG 2.1 2.0    Liver Function Tests No results for input(s): AST, ALT, ALKPHOS, BILITOT, PROT, ALBUMIN in the last 72 hours. No results for input(s): LIPASE, AMYLASE in the last 72 hours. Cardiac Enzymes No results for input(s): CKTOTAL, CKMB, CKMBINDEX, TROPONINI in the last 72 hours.  BNP: BNP (last 3 results) Recent Labs    09/29/2021 0659  BNP 344.3*     ProBNP (last 3 results) No results for input(s): PROBNP in the last 8760 hours.   D-Dimer No results for input(s): DDIMER in the last 72 hours. Hemoglobin A1C No results for input(s): HGBA1C in the last 72 hours.  Fasting Lipid Panel No results for input(s): CHOL, HDL, LDLCALC, TRIG, CHOLHDL, LDLDIRECT in the last 72 hours. Thyroid Function Tests No results for input(s): TSH, T4TOTAL, T3FREE, THYROIDAB in the last 72 hours.  Invalid input(s): FREET3  Other results:   Imaging    DG CHEST PORT 1 VIEW  Result Date: 10/15/2021 CLINICAL DATA:  Dyspnea EXAM: PORTABLE CHEST 1 VIEW COMPARISON:  10/11/2021 FINDINGS: Endotracheal tube removed. Swan-Ganz catheter removed. Feeding tube has been placed with the tip not visualized. Lungs are clear without infiltrate effusion or edema. Mild cardiac enlargement. IMPRESSION: Improved aeration of the lungs.  Lungs are now clear. Electronically Signed   By: Franchot Gallo M.D.   On: 10/15/2021 12:08     Medications:     Scheduled Medications:  amiodarone  200 mg Per Tube BID   aspirin  81 mg Per Tube Daily   atorvastatin  80 mg Per Tube Daily   chlorhexidine gluconate (MEDLINE KIT)  15 mL Mouth Rinse BID   Chlorhexidine Gluconate  Cloth  6 each Topical Daily   digoxin  0.125 mg Per Tube Daily   docusate  100 mg Per Tube BID   enoxaparin (LOVENOX) injection  50 mg Subcutaneous Q24H   feeding supplement (PROSource TF)  45 mL Per Tube TID   hydrocortisone cream  1 application Topical TID   insulin aspart  0-15 Units Subcutaneous Q4H   insulin glargine-yfgn  20 Units Subcutaneous BID   ipratropium  0.5 mg Nebulization BID   mouth rinse  15 mL Mouth Rinse BID   melatonin  3 mg Per Tube QHS   polyethylene glycol  17 g Per Tube Daily   sacubitril-valsartan  1 tablet Per Tube BID   sodium chloride flush  3 mL Intravenous Q12H   spironolactone  25 mg Per Tube Daily   tamsulosin  0.4 mg Oral Daily   ticagrelor  90 mg Per Tube BID    Infusions:  sodium chloride Stopped (10/15/21 1900)   feeding supplement (VITAL 1.5 CAL) 1,000 mL (November 04, 2021 0015)    PRN Medications: sodium chloride, acetaminophen (TYLENOL) oral liquid 160 mg/5 mL, albuterol, food thickener, guaiFENesin, hydrALAZINE, sodium chloride flush   Assessment/Plan   1. CAD/NSTEMI:  Prior inferior MI and placement of 2 stents to RCA in 1996.   - Admitted with NSTEMI, found to have 75% distal LM stenosis with occluded proximal LAD and ostial LCx.  S/p PCI distal LM through mid LAD, ostial LCx remains occluded.-> medical rx - No s/s angina. Continue DAPT + statin .  2. Acute systolic CHF -> cardiogenic shock: Ischemic cardiomyopathy.  EF 20-25% on echo. RV normal.  Impella placed in cath lab with cardiogenic shock.  Impella pulled 10/31 - milrinone stopped 11/3. Co-ox dropped to 53% -> 46% this am. Recheck 59%. Will not restart milrinone at this point  - CVP remains low ~3. Got 1 dose IV lasix yesterday - Continue entresto 24/26 mg BID - Continue spiro 25 mg daily - Continue digoxin 0.125 mg daily - No beta blocker d/t recent shock - Consider SGLT2i once able to take pills and can maintain perineal hygiene 3. NSVT/PVCs: Frequent initially now has settled down  on amiodarone gtt.    - Now on po amio. Stable  4. Acute hypoxemic respiratory failure with pulmonary edema and HCAP. Possible sepsis: Intubated 10/27. CXR 10/30 with bibasilar infiltrates.  - Procalcitonin 0.18, blood cultures NGTD. GPC and GNR on sputum, (rare group B strep) - Completed ceftriaxone on 11/4 - Extubated 11/01. - Worse today. Concern for aspiration yesterday but CXR clear yesterday. - Check PCT and cultures. Low threshold for empiric abx  - Suspect anxiety also playing a role. Start Seroquel (He does not take benzos at home. Denies heavy ETOH) - Switch to TFs - I have asked CCM to eyeball as well  5. Type  2 diabetes: SSI.  - Consider SGLT2i prior to d/c as above 6. AKI: In setting of cardiogenic shock.   - now resolved 7. Anemia, acute blood loss - Stable hgb 12.7 8. Thrombocytopenia: -Resolved 9. Dysphagia : -Failed swallow study.  - Cortrak in place - switch to TFs 10. Physical deconditioning - very weak. SNF vs LTAC placement 11. DNR/DNI - met with Palliative 11/5  Length of Stay: [place], MD  11/04/2021, 11:09 AM  Advanced Heart Failure Team Pager [phone] (M-F; 7a - 5p)  Please contact [facility] for night-coverage after hours (5p -7a ) and weekends on [email or web address]

## 2021-11-10 NOTE — Progress Notes (Signed)
PCCM Progress Note:  Called to bedside for patient with worsening encephalopathy and respiratory distress. Patient has been dealing with this since extubation. Palliative care meetings earlier noted. Guarded long term prognosis. Chest xray unchanged - most of the distress is upper airway stridor and dyspnea perhaps from cardiac disease. Also aspiration concerns. Discussed with wife and daughter at bedside. They do not want him to suffer and he does not want to be intubated. Will transition to comfort measures. Orders placed, TRH notified.   Lenice Llamas, MD Pulmonary and [place]

## 2021-11-10 NOTE — Progress Notes (Signed)
   NAME:  Warren Zelek Sr., MRN:  [id number], DOB:  09/21/1951, LOS: 9 ADMISSION DATE:  09/27/2021, CONSULTATION DATE:  09/22/2021 REFERRING MD:  Bensimhon - Adv HF, CHIEF COMPLAINT:  Respiratory failure    History of Present Illness:  70 yo M PMH CAD s/p 2x stents RCA (1996), CVA, HTN, HLD, DM2, prostate cancer who presented to ED 10/28 with R sided chest pain, reportedly beginning 0400 10/28. Later developed SOB, improved with nitro (took nitro at home and again with EMS). In ED chest pain recurred, with associated SOB and diaphoresis, pain radiating down L arm. Found to have NSTEMI, develope respiratory distress and was intubated.  At [facility] he was taken to cath lab and found to have 75% distal LM occlusion, pLAD and Lcx occlusion. He underwent impella supported PCI / DES distal LM through mid LAD. LVEF 25%. Decision made to transfer to [name] for admission.  PCCM is consulted for vent management in setting of respiratory failure requiring intubation   Pertinent  Medical History  CAD HLD HTN DM2 CVA Significant Hospital Events: Including procedures, antibiotic start and stop dates in addition to other pertinent events   10/28 [facility] for chest pain. Taken to cath lab, ruled in for NSTEMI, intubated for resp distress, underwent impella supported PCI/DES with LM to LAD intervention. Transferred to [facility] for cardiogenic shock. PCCM consulted for vent  10/31 tolerating PSV 11/1 extubation 11/6 reconsult stridor  Interim History / Subjective:  Called back for respiratory distress and stridor. Patient is a bit anxious.  Objective   Blood pressure 135/70, pulse 88, temperature 99.1 F (37.3 C), temperature source Oral, resp. rate (!) 28, height 6\' 1"  (1.854 m), weight 105.1 kg, SpO2 96 %. CVP:  [2 mmHg-4 mmHg] 3 mmHg  FiO2 (%):  [98 %] 98 %   Intake/Output Summary (Last 24 hours) at Oct 17, 2021 1306 Last data filed at 10/17/21 0422 Gross per 24 hour  Intake 1840 ml  Output 1050 ml   Net 790 ml    Filed Weights   10/14/21 0600 10/15/21 0636 17-Oct-2021 0527  Weight: 108.7 kg 106.2 kg 105.1 kg    Examination: Chronically ill appearing man tachypneic in bed Dried out retained secretions sticking to posterior oropharynx and roof of mouth MM dry Transmitted upper airway sounds with rapid breathing c/w retained vallecular secretions Ext without edema Moves all 4 ext to command Mildly anxious  Limited point-of-care echocardiogram shows persistent LV dysfunction with an ejection fraction of approximately 25%.  Ancillary tests  Coox okay Rising WBC Pct neg Lactate, CXR benign CBGs are up: glargine has been adjusted  Assessment & Plan:   Cardiogenic shock, biventricular heart failure- improved Acute HFrEF improved CAD w/ NSTEMI s/p DES to distal LMCA through mid LAD  HTN HLD AKI, resolved  New: ongoing issues with ability to expectorate pharyngeal secretions, probable microaspiration occurring looking at clinical appearance and MBSS  - CPT, hypertonic saline trial, NTS PRN, discussed with RT - RN to work on getting retained oropharyngeal secretions brushed off - Trial of seroquel seems reasonable - Not sure he needs abx quite yet, lets see how does with more aggressive pulmonary toileting - Will follow with you  Best Practice (right click and "Reselect all SmartList Selections" daily)  Per primary  Erskine Emery MD PCCM

## 2021-11-10 NOTE — Progress Notes (Signed)
Notified [facility] of patient's passing.

## 2021-11-10 NOTE — Progress Notes (Signed)
[facility] PROGRESS NOTE    Progress Note  Warren Silsby Sr.  SXJ:[id number] DOB: [birth year] DOA: 09/28/2021 PCP: [name], MD     Brief Narrative:   Warren Heemstra Abeln Sr. is an 69 y.o. male past medical history of CAD status post stent x2 to the RCA, essential hypertension, diabetes mellitus type 2 prostate cancer who comes into the ED on 09/17/2021 found to have an NSTEMI and respiratory distress and intubated.  At [facility] was taken to the Cath Lab found distal LM occlusion, LAD and left circumflex, underwent Impella supported PCI with DES placement to the mid LAD transferred to the [facility] under PCCM due to acute respiratory failure.Impella placed in Cath Lab with cardiogenic shock, discontinue on 10/10/2021.  Significant Events: 10/28 [facility] for chest pain. Taken to cath lab, ruled in for NSTEMI, intubated for resp distress, underwent impella supported PCI/DES with LM to LAD intervention. Transferred to [facility] for cardiogenic shock. PCCM consulted for vent  10/31 tolerating PSV 11/1 extubation  Assessment/Plan:   Acute respiratory failure with hypoxia requiring mechanical ventilation/acute pulmonary edema/Cardiogenic shock due to biventricular failure requiring milrinone and Impella support/acute systolic heart failure: Currently saturating greater than 95% on room air. Continues to be euvolemic.  Appears euvolemic complete his course of antibiotics in house. Core track has been placed for nutritional purposes. Speech recommended dysphagia 3 diet with honey thickened liquid is a mild risk of aspiration. Currently on amiodarone Physical therapy evaluated the patient and recommended inpatient rehab, the patient is too weak and debilitated for inpatient rehab. We have consulted TOC for SNF versus LTAC. He seems to have more upper airway secretions than lower airways.  NSTEMI: Inferior status post 2 stent found to have a 75% distal LM stenosis occluded  proximal LAD and ostial circumflex status post PCI. Continue DAPT therapy and statin.  Acute systolic heart failure/cardiogenic shock: Currently on amiodarone, Entresto, Aldactone and digoxin. Wilder Glade as an outpatient. Received a single dose of IV Lasix creatinine holding steady.  NSVT/PVCs: Initially frequently. Now on oral amio.  Community-acquired pneumonia: Has completed his course of antibiotic. Mild rise in leukocytosis yesterday we will continue to monitor fever curve.  Diabetes mellitus type 2:  With enteral feedings blood glucose continues to be high, will increase his long-acting insulin continue sliding scale.   Nutrition: Speech reevaluated the patient recommended dysphagia 2 diet. Hopefully we can take out Coretrack over the weekend.    DVT prophylaxis: asa and brillinta, lovenox Family Communication:wife Status is: Inpatient  Remains inpatient appropriate because: Acute severity of illness    Code Status:     Code Status Orders  (From admission, onward)           Start     Ordered   09/15/2021 1803  Full code  Continuous        10/08/2021 1805           Code Status History     Date Active Date Inactive Code Status Order ID Comments User Context   04/30/2018 0426 05/01/2018 2108 Full Code [id number]  Harrie Foreman, MD Inpatient      Advance Directive Documentation    Flowsheet Row Most Recent Value  Type of Advance Directive Healthcare Power of Attorney, Living will  Pre-existing out of facility DNR order (yellow form or pink MOST form) --  "MOST" Form in Place? --         IV Access:   Peripheral IV   Procedures and diagnostic studies:  DG CHEST PORT 1 VIEW  Result Date: 10/15/2021 CLINICAL DATA:  Dyspnea EXAM: PORTABLE CHEST 1 VIEW COMPARISON:  10/11/2021 FINDINGS: Endotracheal tube removed. Swan-Ganz catheter removed. Feeding tube has been placed with the tip not visualized. Lungs are clear without infiltrate effusion or  edema. Mild cardiac enlargement. IMPRESSION: Improved aeration of the lungs.  Lungs are now clear. Electronically Signed   By: Franchot Gallo M.D.   On: 10/15/2021 12:08     Medical Consultants:   None.   Subjective:    Warren Cera Balentine Sr. no complaints.  Objective:    Vitals:   November 06, 2021 0450 06-Nov-2021 0527 Nov 06, 2021 0743 11-06-21 0805  BP: (!) 117/58   135/70  Pulse: 80  88   Resp: (!) 22  (!) 28 (!) 28  Temp: 98.4 F (36.9 C)   99.1 F (37.3 C)  TempSrc: Oral   Oral  SpO2: 96%     Weight:  105.1 kg    Height:       SpO2: 96 % O2 Flow Rate (L/min): 4 L/min FiO2 (%): 98 %   Intake/Output Summary (Last 24 hours) at 2021-11-06 0821 Last data filed at Nov 06, 2021 0422 Gross per 24 hour  Intake 1840 ml  Output 1050 ml  Net 790 ml    Filed Weights   10/14/21 0600 10/15/21 0636 11-06-2021 0527  Weight: 108.7 kg 106.2 kg 105.1 kg    Exam: General exam: In no acute distress. Respiratory system: Good air movement and clear to auscultation. Cardiovascular system: S1 & S2 heard, RRR. No JVD. Gastrointestinal system: Abdomen is nondistended, soft and nontender.  Extremities: No pedal edema. Skin: No rashes, lesions or ulcers  Data Reviewed:    Labs: Basic Metabolic Panel: Recent Labs  Lab 10/10/21 0354 10/11/21 0415 10/12/21 0308 10/13/21 0301 10/14/21 0445 10/15/21 0443 11/06/21 0429  NA 137 140 139 143 144 142 141  K 4.4 3.9 3.7 3.8 4.3 4.7 4.8  CL 104 104 102 110 111 110 107  CO2 '24 25 26 26 27 24 26  ' GLUCOSE 193* 187* 164* 200* 228* 204* 257*  BUN 61* 63* 62* 53* 47* 40* 44*  CREATININE 2.20* 1.88* 1.61* 1.23 1.19 1.21 1.22  CALCIUM 8.8* 8.8* 8.9 9.2 9.8 9.8 9.8  MG 2.3 2.0 2.2 2.2 2.1 2.1 2.0  PHOS 3.5 3.4  --   --   --   --   --     GFR Estimated Creatinine Clearance: 71.7 mL/min (by C-G formula based on SCr of 1.22 mg/dL). Liver Function Tests: No results for input(s): AST, ALT, ALKPHOS, BILITOT, PROT, ALBUMIN in the last 168 hours. No  results for input(s): LIPASE, AMYLASE in the last 168 hours. No results for input(s): AMMONIA in the last 168 hours. Coagulation profile No results for input(s): INR, PROTIME in the last 168 hours.  COVID-19 Labs  No results for input(s): DDIMER, FERRITIN, LDH, CRP in the last 72 hours.   Lab Results  Component Value Date   [place] NEGATIVE 09/21/2021    CBC: Recent Labs  Lab 10/12/21 0308 10/13/21 0301 10/14/21 0445 10/15/21 0443 11-06-21 0429  WBC 8.3 8.4 9.7 11.1* 15.1*  HGB 11.2* 11.9* 11.6* 11.3* 12.7*  HCT 34.4* 36.9* 36.9* 36.3* 40.0  MCV 82.7 82.9 84.2 85.0 84.2  PLT 111* 140* 160 178 229    Cardiac Enzymes: No results for input(s): CKTOTAL, CKMB, CKMBINDEX, TROPONINI in the last 168 hours. BNP (last 3 results) No results for input(s): PROBNP in the last 8760 hours.  CBG: Recent Labs  Lab 10/15/21 2034 10/15/21 2327 10-29-2021 0007 10-29-2021 0452 10-29-21 0803  GLUCAP 203* 183* 179* 247* 215*    D-Dimer: No results for input(s): DDIMER in the last 72 hours. Hgb A1c: No results for input(s): HGBA1C in the last 72 hours. Lipid Profile: No results for input(s): CHOL, HDL, LDLCALC, TRIG, CHOLHDL, LDLDIRECT in the last 72 hours. Thyroid function studies: No results for input(s): TSH, T4TOTAL, T3FREE, THYROIDAB in the last 72 hours.  Invalid input(s): FREET3 Anemia work up: No results for input(s): VITAMINB12, FOLATE, FERRITIN, TIBC, IRON, RETICCTPCT in the last 72 hours. Sepsis Labs: Recent Labs  Lab 10/13/21 0301 10/14/21 0445 10/15/21 0443 10-29-21 0429  WBC 8.4 9.7 11.1* 15.1*    Microbiology Recent Results (from the past 240 hour(s))  Resp Panel by RT-PCR (Flu A&B, Covid) Nasopharyngeal Swab     Status: None   Collection Time: 09/16/2021  9:20 AM   Specimen: Nasopharyngeal Swab; Nasopharyngeal(NP) swabs in vial transport medium  Result Value Ref Range Status   SARS Coronavirus 2 by RT PCR NEGATIVE NEGATIVE Final    Comment:  (NOTE) SARS-CoV-2 target nucleic acids are NOT DETECTED.  The SARS-CoV-2 RNA is generally detectable in upper respiratory specimens during the acute phase of infection. The lowest concentration of SARS-CoV-2 viral copies this assay can detect is 138 copies/mL. A negative result does not preclude SARS-Cov-2 infection and should not be used as the sole basis for treatment or other patient management decisions. A negative result may occur with  improper specimen collection/handling, submission of specimen other than nasopharyngeal swab, presence of viral mutation(s) within the areas targeted by this assay, and inadequate number of viral copies(<138 copies/mL). A negative result must be combined with clinical observations, patient history, and epidemiological information. The expected result is Negative.  Fact Sheet for Patients:  EntrepreneurPulse.com.au  Fact Sheet for Healthcare Providers:  IncredibleEmployment.be  This test is no t yet approved or cleared by the [place] FDA and  has been authorized for detection and/or diagnosis of SARS-CoV-2 by FDA under an Emergency Use Authorization (EUA). This EUA will remain  in effect (meaning this test can be used) for the duration of the COVID-19 declaration under Section 564(b)(1) of the Act, 21 U.S.C.section 360bbb-3(b)(1), unless the authorization is terminated  or revoked sooner.       Influenza A by PCR NEGATIVE NEGATIVE Final   Influenza B by PCR NEGATIVE NEGATIVE Final    Comment: (NOTE) The Xpert Xpress SARS-CoV-2/FLU/RSV plus assay is intended as an aid in the diagnosis of influenza from Nasopharyngeal swab specimens and should not be used as a sole basis for treatment. Nasal washings and aspirates are unacceptable for Xpert Xpress SARS-CoV-2/FLU/RSV testing.  Fact Sheet for Patients: EntrepreneurPulse.com.au  Fact Sheet for Healthcare  Providers: IncredibleEmployment.be  This test is not yet approved or cleared by the [place] FDA and has been authorized for detection and/or diagnosis of SARS-CoV-2 by FDA under an Emergency Use Authorization (EUA). This EUA will remain in effect (meaning this test can be used) for the duration of the COVID-19 declaration under Section 564(b)(1) of the Act, 21 U.S.C. section 360bbb-3(b)(1), unless the authorization is terminated or revoked.  Performed at [facility], [address], [place], [place] [address]   Surgical PCR screen     Status: None   Collection Time: 10/03/2021  7:07 PM   Specimen: Nasal Mucosa; Nasal Swab  Result Value Ref Range Status   MRSA, PCR NEGATIVE NEGATIVE Final  Staphylococcus aureus NEGATIVE NEGATIVE Final    Comment: (NOTE) The Xpert SA Assay (FDA approved for NASAL specimens in patients 49 years of age and older), is one component of a comprehensive surveillance program. It is not intended to diagnose infection nor to guide or monitor treatment. Performed at [facility], [place] [address], [place], [place] [address]   Culture, Respiratory w Gram Stain     Status: None   Collection Time: 10/09/21  8:34 AM   Specimen: Tracheal Aspirate; Respiratory  Result Value Ref Range Status   Specimen Description TRACHEAL ASPIRATE  Final   Special Requests NONE  Final   Gram Stain   Final    RARE SQUAMOUS EPITHELIAL CELLS PRESENT ABUNDANT WBC PRESENT, PREDOMINANTLY PMN FEW GRAM POSITIVE COCCI FEW GRAM NEGATIVE RODS    Culture   Final    FEW GROUP B STREP(S.AGALACTIAE)ISOLATED TESTING AGAINST S. AGALACTIAE NOT ROUTINELY PERFORMED DUE TO PREDICTABILITY OF AMP/PEN/VAN SUSCEPTIBILITY. Performed at [facility], [place] [address] [place], [place] [address]    Report Status 10/11/2021 FINAL  Final  Culture, blood (routine x 2)     Status: None   Collection Time: 10/09/21  9:24 AM   Specimen: BLOOD  Result Value Ref  Range Status   Specimen Description BLOOD BLOOD LEFT FOREARM  Final   Special Requests   Final    BOTTLES DRAWN AEROBIC AND ANAEROBIC Blood Culture adequate volume   Culture   Final    NO GROWTH 5 DAYS Performed at [facility], [place] [address] [place], [place] [address]    Report Status 10/14/2021 FINAL  Final  Culture, blood (routine x 2)     Status: None   Collection Time: 10/09/21  9:35 AM   Specimen: BLOOD  Result Value Ref Range Status   Specimen Description BLOOD BLOOD RIGHT HAND  Final   Special Requests   Final    BOTTLES DRAWN AEROBIC AND ANAEROBIC Blood Culture adequate volume   Culture   Final    NO GROWTH 5 DAYS Performed at [facility], [place] [address] [place], [place] [address]    Report Status 10/14/2021 FINAL  Final     Medications:    amiodarone  200 mg Per Tube BID   aspirin  81 mg Per Tube Daily   atorvastatin  80 mg Per Tube Daily   chlorhexidine gluconate (MEDLINE KIT)  15 mL Mouth Rinse BID   Chlorhexidine Gluconate Cloth  6 each Topical Daily   digoxin  0.125 mg Per Tube Daily   docusate  100 mg Per Tube BID   enoxaparin (LOVENOX) injection  50 mg Subcutaneous Q24H   feeding supplement (PROSource TF)  45 mL Per Tube TID   hydrocortisone cream  1 application Topical TID   insulin aspart  0-15 Units Subcutaneous Q4H   insulin glargine-yfgn  10 Units Subcutaneous BID   ipratropium  0.5 mg Nebulization BID   mouth rinse  15 mL Mouth Rinse BID   melatonin  3 mg Per Tube QHS   polyethylene glycol  17 g Per Tube Daily   sacubitril-valsartan  1 tablet Per Tube BID   sodium chloride flush  3 mL Intravenous Q12H   spironolactone  25 mg Per Tube Daily   tamsulosin  0.4 mg Oral Daily   ticagrelor  90 mg Per Tube BID   Continuous Infusions:  sodium chloride Stopped (10/15/21 1900)   feeding supplement (VITAL 1.5 CAL) 1,000 mL (11-03-21 0015)  LOS: 9 days   Charlynne Cousins  [facility]  2021-11-08, 8:21 AM

## 2021-11-10 DEATH — deceased

## 2022-01-11 ENCOUNTER — Other Ambulatory Visit: Payer: Self-pay

## 2022-01-20 ENCOUNTER — Ambulatory Visit: Payer: Self-pay | Admitting: Urology
